# Patient Record
Sex: Female | Born: 1960 | Race: White | Hispanic: No | Marital: Married | Smoking: Former smoker
Health system: Southern US, Community
[De-identification: ages and names within clinical notes are randomized; demographics above are authoritative.]

## PROBLEM LIST (undated history)

## (undated) DIAGNOSIS — E039 Hypothyroidism, unspecified: Secondary | ICD-10-CM

## (undated) DIAGNOSIS — E079 Disorder of thyroid, unspecified: Secondary | ICD-10-CM

## (undated) DIAGNOSIS — Z8489 Family history of other specified conditions: Secondary | ICD-10-CM

## (undated) DIAGNOSIS — K759 Inflammatory liver disease, unspecified: Secondary | ICD-10-CM

## (undated) DIAGNOSIS — E785 Hyperlipidemia, unspecified: Secondary | ICD-10-CM

## (undated) DIAGNOSIS — E119 Type 2 diabetes mellitus without complications: Secondary | ICD-10-CM

## (undated) DIAGNOSIS — I10 Essential (primary) hypertension: Secondary | ICD-10-CM

## (undated) DIAGNOSIS — C801 Malignant (primary) neoplasm, unspecified: Secondary | ICD-10-CM

## (undated) HISTORY — PX: MELANOMA EXCISION: SHX5266

## (undated) HISTORY — DX: Essential (primary) hypertension: I10

## (undated) HISTORY — PX: LAPAROSCOPIC GASTRIC BANDING: SHX1100

## (undated) HISTORY — DX: Malignant (primary) neoplasm, unspecified: C80.1

## (undated) HISTORY — PX: CHOLECYSTECTOMY: SHX55

## (undated) HISTORY — PX: TONSILLECTOMY: SUR1361

## (undated) HISTORY — DX: Disorder of thyroid, unspecified: E07.9

---
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
## 1998-12-27 ENCOUNTER — Other Ambulatory Visit: Admission: RE | Admit: 1998-12-27 | Discharge: 1998-12-27 | Payer: Self-pay | Admitting: Obstetrics and Gynecology

## 2001-03-24 ENCOUNTER — Encounter: Admission: RE | Admit: 2001-03-24 | Discharge: 2001-03-24 | Payer: Self-pay | Admitting: Obstetrics and Gynecology

## 2001-03-24 ENCOUNTER — Encounter: Payer: Self-pay | Admitting: Obstetrics and Gynecology

## 2001-03-27 ENCOUNTER — Other Ambulatory Visit: Admission: RE | Admit: 2001-03-27 | Discharge: 2001-03-27 | Payer: Self-pay | Admitting: Obstetrics and Gynecology

## 2003-11-07 ENCOUNTER — Encounter: Admission: RE | Admit: 2003-11-07 | Discharge: 2003-11-07 | Payer: Self-pay | Admitting: Obstetrics and Gynecology

## 2003-11-15 ENCOUNTER — Other Ambulatory Visit: Admission: RE | Admit: 2003-11-15 | Discharge: 2003-11-15 | Payer: Self-pay | Admitting: Obstetrics and Gynecology

## 2004-11-15 ENCOUNTER — Other Ambulatory Visit: Admission: RE | Admit: 2004-11-15 | Discharge: 2004-11-15 | Payer: Self-pay | Admitting: Obstetrics and Gynecology

## 2006-08-19 ENCOUNTER — Encounter: Payer: Self-pay | Admitting: Family Medicine

## 2006-11-03 ENCOUNTER — Ambulatory Visit: Payer: Self-pay | Admitting: Family Medicine

## 2006-11-03 DIAGNOSIS — E039 Hypothyroidism, unspecified: Secondary | ICD-10-CM

## 2006-11-03 DIAGNOSIS — M545 Low back pain: Secondary | ICD-10-CM

## 2006-11-03 DIAGNOSIS — E785 Hyperlipidemia, unspecified: Secondary | ICD-10-CM

## 2006-11-03 DIAGNOSIS — M7730 Calcaneal spur, unspecified foot: Secondary | ICD-10-CM

## 2006-11-03 DIAGNOSIS — G43009 Migraine without aura, not intractable, without status migrainosus: Secondary | ICD-10-CM | POA: Insufficient documentation

## 2006-11-06 ENCOUNTER — Encounter: Admission: RE | Admit: 2006-11-06 | Discharge: 2006-11-06 | Payer: Self-pay | Admitting: Obstetrics and Gynecology

## 2006-11-14 ENCOUNTER — Encounter: Payer: Self-pay | Admitting: Family Medicine

## 2006-11-15 ENCOUNTER — Encounter: Payer: Self-pay | Admitting: Family Medicine

## 2006-11-18 ENCOUNTER — Encounter: Payer: Self-pay | Admitting: Family Medicine

## 2006-11-18 ENCOUNTER — Other Ambulatory Visit: Admission: RE | Admit: 2006-11-18 | Discharge: 2006-11-18 | Payer: Self-pay | Admitting: Obstetrics and Gynecology

## 2006-11-18 LAB — CONVERTED CEMR LAB
ALT: 29 units/L
AST: 23 units/L
Alkaline Phosphatase: 53 units/L
GGT: 42 units/L
Glucose, Bld: 74 mg/dL
Total Protein: 7.6 g/dL
Triglycerides: 153 mg/dL

## 2006-12-03 ENCOUNTER — Encounter: Payer: Self-pay | Admitting: Family Medicine

## 2006-12-03 LAB — CONVERTED CEMR LAB: Cholesterol, target level: 200 mg/dL

## 2006-12-04 ENCOUNTER — Encounter: Payer: Self-pay | Admitting: Family Medicine

## 2006-12-09 ENCOUNTER — Telehealth (INDEPENDENT_AMBULATORY_CARE_PROVIDER_SITE_OTHER): Payer: Self-pay | Admitting: *Deleted

## 2006-12-23 ENCOUNTER — Encounter: Payer: Self-pay | Admitting: Family Medicine

## 2007-01-16 ENCOUNTER — Telehealth (INDEPENDENT_AMBULATORY_CARE_PROVIDER_SITE_OTHER): Payer: Self-pay | Admitting: *Deleted

## 2007-02-05 ENCOUNTER — Telehealth: Payer: Self-pay | Admitting: Family Medicine

## 2007-03-04 ENCOUNTER — Encounter: Payer: Self-pay | Admitting: Family Medicine

## 2007-03-04 LAB — CONVERTED CEMR LAB
Albumin: 4.7 g/dL (ref 3.5–5.2)
BUN: 17 mg/dL (ref 6–23)
CO2: 25 meq/L (ref 19–32)
Calcium: 9.5 mg/dL (ref 8.4–10.5)
Chloride: 99 meq/L (ref 96–112)
Cholesterol: 152 mg/dL (ref 0–200)
Glucose, Bld: 128 mg/dL — ABNORMAL HIGH (ref 70–99)
HDL: 38 mg/dL — ABNORMAL LOW (ref >39)
Potassium: 3.4 meq/L — ABNORMAL LOW (ref 3.5–5.3)
Sodium: 139 meq/L (ref 135–145)
Total Protein: 7.5 g/dL (ref 6.0–8.3)
Triglycerides: 130 mg/dL (ref <150)

## 2007-03-06 ENCOUNTER — Ambulatory Visit: Payer: Self-pay | Admitting: Family Medicine

## 2007-03-06 LAB — CONVERTED CEMR LAB: Glucose, Bld: 159 mg/dL

## 2007-07-03 ENCOUNTER — Ambulatory Visit: Payer: Self-pay | Admitting: Family Medicine

## 2007-07-03 DIAGNOSIS — M5126 Other intervertebral disc displacement, lumbar region: Secondary | ICD-10-CM

## 2007-07-03 LAB — CONVERTED CEMR LAB
Blood Glucose, Fasting: 127 mg/dL
Hgb A1c MFr Bld: 5.9 %

## 2007-07-29 ENCOUNTER — Telehealth: Payer: Self-pay | Admitting: Family Medicine

## 2007-10-09 ENCOUNTER — Ambulatory Visit: Payer: Self-pay | Admitting: Family Medicine

## 2007-10-09 DIAGNOSIS — E669 Obesity, unspecified: Secondary | ICD-10-CM

## 2007-10-09 LAB — CONVERTED CEMR LAB: Hgb A1c MFr Bld: 5.7 %

## 2007-10-10 LAB — CONVERTED CEMR LAB
Creatinine, Ser: 0.97 mg/dL (ref 0.40–1.20)
HDL: 42 mg/dL (ref >39)
Total CHOL/HDL Ratio: 4.3
VLDL: 27 mg/dL (ref 0–40)

## 2007-11-09 ENCOUNTER — Ambulatory Visit: Payer: Self-pay | Admitting: Family Medicine

## 2007-11-10 ENCOUNTER — Encounter: Payer: Self-pay | Admitting: Family Medicine

## 2007-11-10 LAB — CONVERTED CEMR LAB
ALT: 34 units/L (ref 0–35)
Albumin: 4.2 g/dL (ref 3.5–5.2)
Alkaline Phosphatase: 49 units/L (ref 39–117)
Basophils Absolute: 0 10*3/uL (ref 0.0–0.1)
Eosinophils Absolute: 0.1 10*3/uL (ref 0.0–0.7)
Eosinophils Relative: 2 % (ref 0–5)
HCT: 41.8 % (ref 36.0–46.0)
Lymphocytes Relative: 22 % (ref 12–46)
Lymphs Abs: 1.8 10*3/uL (ref 0.7–4.0)
MCV: 90.5 fL (ref 78.0–100.0)
Neutrophils Relative %: 61 % (ref 43–77)
Platelets: 256 10*3/uL (ref 150–400)
Potassium: 4 meq/L (ref 3.5–5.3)
RDW: 12.9 % (ref 11.5–15.5)
Sodium: 142 meq/L (ref 135–145)
Total Bilirubin: 0.7 mg/dL (ref 0.3–1.2)
Total Protein: 6.9 g/dL (ref 6.0–8.3)
WBC: 8 10*3/uL (ref 4.0–10.5)

## 2007-12-10 ENCOUNTER — Encounter: Payer: Self-pay | Admitting: Family Medicine

## 2007-12-14 ENCOUNTER — Telehealth: Payer: Self-pay | Admitting: Family Medicine

## 2007-12-16 ENCOUNTER — Encounter: Payer: Self-pay | Admitting: Family Medicine

## 2008-01-04 ENCOUNTER — Encounter: Payer: Self-pay | Admitting: Family Medicine

## 2008-01-05 ENCOUNTER — Ambulatory Visit (HOSPITAL_COMMUNITY): Admission: RE | Admit: 2008-01-05 | Discharge: 2008-01-05 | Payer: Self-pay | Admitting: General Surgery

## 2008-01-05 ENCOUNTER — Encounter (INDEPENDENT_AMBULATORY_CARE_PROVIDER_SITE_OTHER): Payer: Self-pay | Admitting: General Surgery

## 2008-02-03 ENCOUNTER — Encounter: Payer: Self-pay | Admitting: Family Medicine

## 2008-03-28 ENCOUNTER — Ambulatory Visit: Payer: Self-pay | Admitting: Obstetrics and Gynecology

## 2008-03-28 ENCOUNTER — Other Ambulatory Visit: Admission: RE | Admit: 2008-03-28 | Discharge: 2008-03-28 | Payer: Self-pay | Admitting: Obstetrics and Gynecology

## 2008-03-28 ENCOUNTER — Encounter: Payer: Self-pay | Admitting: Obstetrics and Gynecology

## 2008-04-07 ENCOUNTER — Telehealth (INDEPENDENT_AMBULATORY_CARE_PROVIDER_SITE_OTHER): Payer: Self-pay | Admitting: *Deleted

## 2008-04-11 ENCOUNTER — Ambulatory Visit: Payer: Self-pay | Admitting: Obstetrics and Gynecology

## 2008-04-12 ENCOUNTER — Encounter: Payer: Self-pay | Admitting: Family Medicine

## 2008-05-09 ENCOUNTER — Encounter: Payer: Self-pay | Admitting: Family Medicine

## 2008-05-16 ENCOUNTER — Ambulatory Visit: Payer: Self-pay | Admitting: Family Medicine

## 2008-06-13 ENCOUNTER — Encounter: Payer: Self-pay | Admitting: Family Medicine

## 2008-06-20 ENCOUNTER — Encounter: Payer: Self-pay | Admitting: Family Medicine

## 2008-06-29 DIAGNOSIS — Z9884 Bariatric surgery status: Secondary | ICD-10-CM

## 2008-07-05 ENCOUNTER — Ambulatory Visit: Payer: Self-pay | Admitting: Family Medicine

## 2008-07-05 DIAGNOSIS — I10 Essential (primary) hypertension: Secondary | ICD-10-CM

## 2008-07-25 ENCOUNTER — Encounter: Admission: RE | Admit: 2008-07-25 | Discharge: 2008-07-25 | Payer: Self-pay | Admitting: Obstetrics and Gynecology

## 2008-07-26 ENCOUNTER — Encounter: Payer: Self-pay | Admitting: Family Medicine

## 2008-07-26 LAB — CONVERTED CEMR LAB
BUN: 9 mg/dL (ref 6–23)
CO2: 20 meq/L (ref 19–32)
Calcium: 9.3 mg/dL (ref 8.4–10.5)
Chloride: 106 meq/L (ref 96–112)
Creatinine, Ser: 0.73 mg/dL (ref 0.40–1.20)
Glucose, Bld: 88 mg/dL (ref 70–99)

## 2008-08-08 ENCOUNTER — Ambulatory Visit: Payer: Self-pay | Admitting: Family Medicine

## 2008-08-08 ENCOUNTER — Telehealth (INDEPENDENT_AMBULATORY_CARE_PROVIDER_SITE_OTHER): Payer: Self-pay | Admitting: *Deleted

## 2008-08-08 LAB — CONVERTED CEMR LAB
Blood in Urine, dipstick: NEGATIVE
Ketones, urine, test strip: NEGATIVE
Protein, U semiquant: NEGATIVE
Specific Gravity, Urine: 1.02
Urobilinogen, UA: 4
pH: 5.5

## 2008-08-09 ENCOUNTER — Encounter: Payer: Self-pay | Admitting: Family Medicine

## 2008-08-09 DIAGNOSIS — Z8619 Personal history of other infectious and parasitic diseases: Secondary | ICD-10-CM

## 2008-08-10 LAB — CONVERTED CEMR LAB
AST: 1081 units/L — ABNORMAL HIGH (ref 0–37)
BUN: 12 mg/dL (ref 6–23)
Basophils Relative: 1 % (ref 0–1)
CO2: 21 meq/L (ref 19–32)
Calcium: 9.5 mg/dL (ref 8.4–10.5)
Chloride: 101 meq/L (ref 96–112)
Creatinine, Ser: 0.7 mg/dL (ref 0.40–1.20)
Eosinophils Absolute: 0.2 10*3/uL (ref 0.0–0.7)
Eosinophils Relative: 3 % (ref 0–5)
GGT: 429 units/L — ABNORMAL HIGH (ref 7–51)
HCT: 47.6 % — ABNORMAL HIGH (ref 36.0–46.0)
Lymphs Abs: 1.5 10*3/uL (ref 0.7–4.0)
MCHC: 32.8 g/dL (ref 30.0–36.0)
MCV: 94.3 fL (ref 78.0–100.0)
Platelets: 186 10*3/uL (ref 150–400)
RBC Folate: 1182 ng/mL — ABNORMAL HIGH (ref 180–600)
Total Bilirubin: 5.3 mg/dL — ABNORMAL HIGH (ref 0.3–1.2)
WBC: 6.7 10*3/uL (ref 4.0–10.5)

## 2008-08-11 ENCOUNTER — Encounter: Admission: RE | Admit: 2008-08-11 | Discharge: 2008-08-11 | Payer: Self-pay | Admitting: Family Medicine

## 2008-08-11 ENCOUNTER — Telehealth: Payer: Self-pay | Admitting: Family Medicine

## 2008-08-11 ENCOUNTER — Encounter: Payer: Self-pay | Admitting: Family Medicine

## 2008-08-11 LAB — CONVERTED CEMR LAB: Hepatitis B Surface Ag: NEGATIVE

## 2008-08-12 ENCOUNTER — Encounter: Payer: Self-pay | Admitting: Family Medicine

## 2008-08-14 LAB — CONVERTED CEMR LAB
ALT: 820 units/L — ABNORMAL HIGH (ref 0–35)
Basophils Absolute: 0 10*3/uL (ref 0.0–0.1)
Basophils Relative: 0 % (ref 0–1)
Eosinophils Absolute: 0.2 10*3/uL (ref 0.0–0.7)
Eosinophils Relative: 4 % (ref 0–5)
HCT: 47.2 % — ABNORMAL HIGH (ref 36.0–46.0)
Indirect Bilirubin: 1.4 mg/dL — ABNORMAL HIGH (ref 0.0–0.9)
MCV: 96.3 fL (ref 78.0–100.0)
Platelets: 168 10*3/uL (ref 150–400)
RDW: 14.9 % (ref 11.5–15.5)
Total Protein: 6.6 g/dL (ref 6.0–8.3)

## 2008-08-15 ENCOUNTER — Encounter: Payer: Self-pay | Admitting: Family Medicine

## 2008-08-15 ENCOUNTER — Telehealth: Payer: Self-pay | Admitting: Family Medicine

## 2008-08-16 LAB — CONVERTED CEMR LAB
ALT: 679 units/L — ABNORMAL HIGH (ref 0–35)
Bilirubin, Direct: 1.6 mg/dL — ABNORMAL HIGH (ref 0.0–0.3)
Total Bilirubin: 3 mg/dL — ABNORMAL HIGH (ref 0.3–1.2)

## 2008-08-19 ENCOUNTER — Telehealth: Payer: Self-pay | Admitting: Family Medicine

## 2008-08-19 LAB — CONVERTED CEMR LAB
IgG (Immunoglobin G), Serum: 1230 mg/dL (ref 694–1618)
Platelets: 176 10*3/uL (ref 150–400)
Prothrombin Time: 12.3 s (ref 11.6–15.2)

## 2008-08-22 ENCOUNTER — Encounter: Payer: Self-pay | Admitting: Family Medicine

## 2008-08-22 LAB — CONVERTED CEMR LAB
ALT: 771 units/L — ABNORMAL HIGH (ref 0–35)
AST: 634 units/L — ABNORMAL HIGH (ref 0–37)
Albumin: 4.2 g/dL (ref 3.5–5.2)
HCV Quantitative: 2380000 intl units/mL — ABNORMAL HIGH (ref <43)
Total Bilirubin: 3.6 mg/dL — ABNORMAL HIGH (ref 0.3–1.2)
Total Protein: 7.3 g/dL (ref 6.0–8.3)

## 2008-08-30 ENCOUNTER — Encounter: Payer: Self-pay | Admitting: Family Medicine

## 2008-08-30 LAB — CONVERTED CEMR LAB
ALT: 513 units/L — ABNORMAL HIGH (ref 0–35)
AST: 319 units/L — ABNORMAL HIGH (ref 0–37)
Bilirubin, Direct: 0.8 mg/dL — ABNORMAL HIGH (ref 0.0–0.3)

## 2008-09-05 ENCOUNTER — Encounter: Payer: Self-pay | Admitting: Family Medicine

## 2008-09-06 LAB — CONVERTED CEMR LAB
ALT: 311 units/L — ABNORMAL HIGH (ref 0–35)
Albumin: 4 g/dL (ref 3.5–5.2)
Bilirubin, Direct: 0.6 mg/dL — ABNORMAL HIGH (ref 0.0–0.3)
Total Protein: 7 g/dL (ref 6.0–8.3)

## 2008-09-12 ENCOUNTER — Encounter: Payer: Self-pay | Admitting: Family Medicine

## 2008-09-13 LAB — CONVERTED CEMR LAB
Bilirubin, Direct: 0.5 mg/dL — ABNORMAL HIGH (ref 0.0–0.3)
Total Bilirubin: 1.4 mg/dL — ABNORMAL HIGH (ref 0.3–1.2)

## 2008-09-19 ENCOUNTER — Encounter: Payer: Self-pay | Admitting: Family Medicine

## 2008-09-20 LAB — CONVERTED CEMR LAB
Albumin: 4.3 g/dL (ref 3.5–5.2)
Alkaline Phosphatase: 107 units/L (ref 39–117)
Indirect Bilirubin: 0.9 mg/dL (ref 0.0–0.9)
Total Bilirubin: 1.1 mg/dL (ref 0.3–1.2)
Total Protein: 7.4 g/dL (ref 6.0–8.3)

## 2008-10-04 ENCOUNTER — Telehealth: Payer: Self-pay | Admitting: Family Medicine

## 2008-10-04 ENCOUNTER — Encounter: Payer: Self-pay | Admitting: Family Medicine

## 2008-10-05 ENCOUNTER — Encounter: Payer: Self-pay | Admitting: Family Medicine

## 2008-10-09 LAB — CONVERTED CEMR LAB
ALT: 631 units/L — ABNORMAL HIGH (ref 0–35)
Alkaline Phosphatase: 100 units/L (ref 39–117)
Bilirubin, Direct: 0.3 mg/dL (ref 0.0–0.3)
Indirect Bilirubin: 0.7 mg/dL (ref 0.0–0.9)

## 2008-10-11 LAB — CONVERTED CEMR LAB: AFP-Tumor Marker: 5.7 ng/mL (ref 0.0–8.0)

## 2008-11-04 ENCOUNTER — Ambulatory Visit: Payer: Self-pay | Admitting: Family Medicine

## 2008-12-05 ENCOUNTER — Encounter: Payer: Self-pay | Admitting: Family Medicine

## 2008-12-06 LAB — CONVERTED CEMR LAB
ALT: 313 units/L — ABNORMAL HIGH (ref 0–35)
AST: 176 units/L — ABNORMAL HIGH (ref 0–37)
Albumin: 4.2 g/dL (ref 3.5–5.2)
CO2: 23 meq/L (ref 19–32)
Calcium: 9.2 mg/dL (ref 8.4–10.5)
Chloride: 103 meq/L (ref 96–112)
Iron: 118 ug/dL (ref 42–145)
Platelets: 190 10*3/uL (ref 150–400)
Potassium: 3.8 meq/L (ref 3.5–5.3)
RDW: 13.3 % (ref 11.5–15.5)
Saturation Ratios: 32 % (ref 20–55)
Sodium: 139 meq/L (ref 135–145)
TIBC: 372 ug/dL (ref 250–470)
Total Protein: 7.2 g/dL (ref 6.0–8.3)
WBC: 7.8 10*3/uL (ref 4.0–10.5)

## 2008-12-14 ENCOUNTER — Telehealth: Payer: Self-pay | Admitting: Family Medicine

## 2009-03-27 ENCOUNTER — Ambulatory Visit: Payer: Self-pay | Admitting: Family Medicine

## 2009-03-27 LAB — CONVERTED CEMR LAB
Blood in Urine, dipstick: NEGATIVE
Glucose, Urine, Semiquant: NEGATIVE
Protein, U semiquant: NEGATIVE
Specific Gravity, Urine: 1.015
Urobilinogen, UA: 0.2
pH: 5.5

## 2009-05-18 ENCOUNTER — Telehealth: Payer: Self-pay | Admitting: Family Medicine

## 2009-05-23 ENCOUNTER — Ambulatory Visit: Payer: Self-pay | Admitting: Family Medicine

## 2009-05-23 LAB — CONVERTED CEMR LAB
Albumin: 4.2 g/dL (ref 3.5–5.2)
Alkaline Phosphatase: 78 units/L (ref 39–117)
BUN: 12 mg/dL (ref 6–23)
Creatinine, Ser: 0.87 mg/dL (ref 0.40–1.20)
Glucose, Bld: 152 mg/dL — ABNORMAL HIGH (ref 70–99)
HDL: 39 mg/dL — ABNORMAL LOW (ref >39)
LDL Cholesterol: 127 mg/dL — ABNORMAL HIGH (ref 0–99)
Potassium: 3.9 meq/L (ref 3.5–5.3)
Triglycerides: 186 mg/dL — ABNORMAL HIGH (ref <150)

## 2009-07-04 ENCOUNTER — Encounter: Payer: Self-pay | Admitting: Family Medicine

## 2009-07-06 LAB — CONVERTED CEMR LAB
HDL: 35 mg/dL — ABNORMAL LOW (ref >39)
LDL Cholesterol: 61 mg/dL (ref 0–99)
Triglycerides: 242 mg/dL — ABNORMAL HIGH (ref <150)
VLDL: 48 mg/dL — ABNORMAL HIGH (ref 0–40)

## 2009-07-07 ENCOUNTER — Encounter: Payer: Self-pay | Admitting: Family Medicine

## 2009-08-21 ENCOUNTER — Encounter: Admission: RE | Admit: 2009-08-21 | Discharge: 2009-08-21 | Payer: Self-pay | Admitting: Obstetrics and Gynecology

## 2009-10-17 ENCOUNTER — Telehealth: Payer: Self-pay | Admitting: Family Medicine

## 2009-10-18 ENCOUNTER — Encounter: Payer: Self-pay | Admitting: Family Medicine

## 2009-10-19 LAB — CONVERTED CEMR LAB
AST: 67 units/L — ABNORMAL HIGH (ref 0–37)
Alkaline Phosphatase: 81 units/L (ref 39–117)
BUN: 8 mg/dL (ref 6–23)
Creatinine, Ser: 0.78 mg/dL (ref 0.40–1.20)
HDL: 35 mg/dL — ABNORMAL LOW (ref >39)
LDL Cholesterol: 110 mg/dL — ABNORMAL HIGH (ref 0–99)
Potassium: 4.1 meq/L (ref 3.5–5.3)
TSH: 4.279 microintl units/mL (ref 0.350–4.500)
Total CHOL/HDL Ratio: 5.4

## 2009-10-24 ENCOUNTER — Ambulatory Visit: Payer: Self-pay | Admitting: Family Medicine

## 2009-10-26 ENCOUNTER — Other Ambulatory Visit: Admission: RE | Admit: 2009-10-26 | Discharge: 2009-10-26 | Payer: Self-pay | Admitting: Obstetrics and Gynecology

## 2009-10-26 ENCOUNTER — Ambulatory Visit: Payer: Self-pay | Admitting: Obstetrics and Gynecology

## 2010-02-27 NOTE — Assessment & Plan Note (Signed)
Summary: Urinary frequency  Nurse Visit   Primary Care Provider:  Linford Arnold, C   History of Present Illness: Urinary frequency started this AM. No fever or back pain.    Allergies: 1)  ! Asa 2)  ! * Actos Laboratory Results   Urine Tests  Date/Time Received: 03/27/2009 Date/Time Reported: 03/27/2009  Routine Urinalysis   Color: yellow Appearance: Clear Glucose: negative   (Normal Range: Negative) Bilirubin: negative   (Normal Range: Negative) Ketone: negative   (Normal Range: Negative) Spec. Gravity: 1.015   (Normal Range: 1.003-1.035) Blood: negative   (Normal Range: Negative) pH: 5.5   (Normal Range: 5.0-8.0) Protein: negative   (Normal Range: Negative) Urobilinogen: 0.2   (Normal Range: 0-1) Nitrite: negative   (Normal Range: Negative) Leukocyte Esterace: negative   (Normal Range: Negative)       Orders Added: 1)  UA Dipstick w/o Micro (automated)  [81003] 2)  Est. Patient Level I [16109] UA is neg so no need for tX.If not better in 3-4 days then followup.February 28, 20113:35 PM.name

## 2010-02-27 NOTE — Assessment & Plan Note (Signed)
Summary: 5 MONTH FU HTN, lipids   Vital Signs:  Patient profile:   50 year old female Height:      63 inches Weight:      211 pounds Pulse rate:   76 / minute BP sitting:   170 / 86  (right arm) Cuff size:   large  Vitals Entered By: Avon Gully CMA, Duncan Dull) (October 24, 2009 8:10 AM)  Contraindications/Deferment of Procedures/Staging:    Test/Procedure: FLU VAX    Reason for deferment: patient declined  CC: f/u chol and BP   Primary Care Provider:  Linford Arnold, C  CC:  f/u chol and BP.  History of Present Illness: Yesterday had a severe HA adn BP was 180. Then rechecked it and was 190.  Took a BP dose.  Has been taking them every other day.  Yesterday did have popcorn and that the doesn't usually eat. Has gained some weight.  No exercise in the last month.    Current Medications (verified): 1)  Levothroid 150 Mcg Tabs (Levothyroxine Sodium) .Marland Kitchen.. 1 Tab By Mouth Daily On Sat, Sun, Tues, Thurs 2)  Losartan Potassium-Hctz 100-12.5 Mg Tabs (Losartan Potassium-Hctz) .... Take 1 Tablet By Mouth Once A Day 3)  Glucommeter .... One Machine.  Check Cbgs Twice A Week. 4)  Multivitamins  Tabs (Multiple Vitamin) .... Take 1 Tablet By Mouth Once A Day 5)  Levothroid 175 Mcg Tabs (Levothyroxine Sodium) .... One By Mouth On Mon, Wed, Friday 6)  Fish Oil 1000 Mg Caps (Omega-3 Fatty Acids) .... 2 By Mouth Daily  Allergies (verified): 1)  ! Asa 2)  ! * Actos  Comments:  Nurse/Medical Assistant: The patient's medications and allergies were reviewed with the patient and were updated in the Medication and Allergy Lists. Avon Gully CMA, Duncan Dull) (October 24, 2009 8:11 AM)  Physical Exam  General:  Well-developed,well-nourished,in no acute distress; alert,appropriate and cooperative throughout examination Head:  Normocephalic and atraumatic without obvious abnormalities. No apparent alopecia or balding. Lungs:  Normal respiratory effort, chest expands symmetrically. Lungs are  clear to auscultation, no crackles or wheezes. Heart:  Normal rate and regular rhythm. S1 and S2 normal without gallop, murmur, click, rub or other extra sounds. Extremities:  No LE edema.  Skin:  no rashes.   Cervical Nodes:  No lymphadenopathy noted Psych:  Cognition and judgment appear intact. Alert and cooperative with normal attention span and concentration. No apparent delusions, illusions, hallucinations   Impression & Recommendations:  Problem # 1:  HYPERTENSION, BENIGN (ICD-401.1) Restart daily. F/U in 1 month for BP recheck.   Her updated medication list for this problem includes:    Losartan Potassium-hctz 100-12.5 Mg Tabs (Losartan potassium-hctz) .Marland Kitchen... Take 1 tablet by mouth once a day  Problem # 2:  HYPERLIPIDEMIA NEC/NOS (ICD-272.4) Will stop statin since her LDL is 110. Her goal is < 130. Discussed fish oil, flax seed and exercise to controle her TG and recheck in 3 months.   Problem # 3:  HEPATITIS C, ACUTE (ICD-070.51) Plans on starting tx at the end of the year.   Complete Medication List: 1)  Levothroid 150 Mcg Tabs (Levothyroxine sodium) .Marland Kitchen.. 1 tab by mouth daily on sat, sun, tues, thurs 2)  Losartan Potassium-hctz 100-12.5 Mg Tabs (Losartan potassium-hctz) .... Take 1 tablet by mouth once a day 3)  Glucommeter  .... One machine.  check cbgs twice a week. 4)  Multivitamins Tabs (Multiple vitamin) .... Take 1 tablet by mouth once a day 5)  Levothroid 175 Mcg Tabs (  Levothyroxine sodium) .... One by mouth on mon, wed, friday 6)  Fish Oil 1000 Mg Caps (Omega-3 fatty acids) .... 2 by mouth daily  Patient Instructions: 1)  Please schedule a follow-up appointment in 1 month for blood pressure check.  Prescriptions: LOSARTAN POTASSIUM-HCTZ 100-12.5 MG TABS (LOSARTAN POTASSIUM-HCTZ) Take 1 tablet by mouth once a day  #90 x 1   Entered and Authorized by:   Nani Gasser MD   Signed by:   Nani Gasser MD on 10/24/2009   Method used:   Electronically to         [facility] (mail-order)             , [place]         Ph: [phone]       Fax: [phone]   RxID:   [id number]

## 2010-02-27 NOTE — Progress Notes (Signed)
Summary: requests labs prior to appt  Phone Note Call from Patient   Caller: Patient Call For: Nani Gasser MD Summary of Call: Pt called and stated she would like to return to have her liver tests rechecked. Her follow up is next Tuesday. Pt. wants to have labs before she sees you that morning.  Are there any other tests you would like her to have?  Mervin Kung CMA  May 18, 2009 1:29 PM   Follow-up for Phone Call        Slip printed. Can fax downstairs.  Follow-up by: Nani Gasser MD,  May 18, 2009 3:43 PM  Additional Follow-up for Phone Call Additional follow up Details #1::        Order faxed to [phone].  Mervin Kung CMA  May 18, 2009 4:18 PM

## 2010-02-27 NOTE — Progress Notes (Signed)
Summary: Labwork  Phone Note Call from Patient Call back at Work Phone [phone]   Caller: Patient Call For: Nani Gasser MD Summary of Call: Pt calls and states she is going downstairs in the morning for labwork and needs the order sent down. Please advise Initial call taken by: Kathlene November,  October 17, 2009 1:33 PM  Follow-up for Phone Call        Printed.  Follow-up by: Nani Gasser MD,  October 17, 2009 2:36 PM

## 2010-02-27 NOTE — Letter (Signed)
[facility]: [facility]   Imported By: Lanelle Bal 06/02/2009 12:57:33  _____________________________________________________________________  External Attachment:    Type:   Image     Comment:   External Document

## 2010-02-27 NOTE — Assessment & Plan Note (Signed)
Summary: 6 MO FU HTN, sugars, thyroid   Vital Signs:  Patient profile:   50 year old female Height:      63 inches Weight:      202 pounds BMI:     35.91 Pulse rate:   73 / minute BP sitting:   126 / 71  (left arm) Cuff size:   large  Vitals Entered By: Kathlene November (May 23, 2009 8:28 AM) CC: followup BP. Pt states blood sugars running in the 90's fasting   Primary Care Provider:  Linford Arnold, C  CC:  followup BP. Pt states blood sugars running in the 90's fasting.  History of Present Illness: followup BP. Pt states blood sugars running in the 90's fasting.   Current Medications (verified): 1)  Levothroid 112 Mcg Tabs (Levothyroxine Sodium) .... Take 1 Tablet By Mouth Once A Day 2)  Hyzaar 100-25 Mg  Tabs (Losartan Potassium-Hctz) .... One-Half  Tab By Mouth 3)  Darvocet-N 100 100-650 Mg  Tabs (Propoxyphene N-Apap) .Marland Kitchen.. 1 Tab By Mouth Every 6 Hours As Needed Migriane 4)  Glucommeter .... One Machine.  Check Cbgs Twice A Week. 5)  Multivitamins  Tabs (Multiple Vitamin) .... Take 1 Tablet By Mouth Once A Day  Allergies (verified): 1)  ! Asa 2)  ! * Actos  Comments:  Nurse/Medical Assistant: The patient's medications and allergies were reviewed with the patient and were updated in the Medication and Allergy Lists. Kathlene November (May 23, 2009 8:29 AM)  Physical Exam  General:  Well-developed,well-nourished,in no acute distress; alert,appropriate and cooperative throughout examination Head:  Normocephalic and atraumatic without obvious abnormalities. No apparent alopecia or balding. Lungs:  Normal respiratory effort, chest expands symmetrically. Lungs are clear to auscultation, no crackles or wheezes. Heart:  Normal rate and regular rhythm. S1 and S2 normal without gallop, murmur, click, rub or other extra sounds. No carotid bruits.  Skin:  no rashes.   Psych:  Cognition and judgment appear intact. Alert and cooperative with normal attention span and concentration. No  apparent delusions, illusions, hallucinations   Impression & Recommendations:  Problem # 1:  HYPERTENSION, BENIGN (ICD-401.1) Decrease dose of the HCTZ a little. F/U in 4-6 months.  Her updated medication list for this problem includes:    Losartan Potassium-hctz 100-12.5 Mg Tabs (Losartan potassium-hctz) .Marland Kitchen... Take 1 tablet by mouth once a day  Problem # 2:  IMPAIRED FASTING GLUCOSE (ICD-790.21) Resolved. Will remove from her problem list.   Problem # 3:  HYPOTHYROIDISM NOS (ICD-244.9) Went for labs today. Will adjust med if needed.  She has gained about 6 lbs back post weight loss surgery adn it getting her band filled today.  Her updated medication list for this problem includes:    Levothroid 112 Mcg Tabs (Levothyroxine sodium) .Marland Kitchen... Take 1 tablet by mouth once a day  Labs Reviewed: TSH: 3.653 (12/05/2008)    HgBA1c: 6.0 (05/16/2008) Chol: 181 (10/09/2007)   HDL: 42 (10/09/2007)   LDL: 112 (10/09/2007)   TG: 136 (10/09/2007)  Problem # 4:  HEPATITIS C, ACUTE (ICD-070.51) Had labs drawn for liver enzymes.   Will fax labs over to Dr. Loman Chroman.   Orders: T-Ferritin ([id number])  Complete Medication List: 1)  Levothroid 112 Mcg Tabs (Levothyroxine sodium) .... Take 1 tablet by mouth once a day 2)  Losartan Potassium-hctz 100-12.5 Mg Tabs (Losartan potassium-hctz) .... Take 1 tablet by mouth once a day 3)  Darvocet-n 100 100-650 Mg Tabs (Propoxyphene n-apap) .Marland Kitchen.. 1 tab by mouth every 6 hours as needed  migriane 4)  Glucommeter  .... One machine.  check cbgs twice a week. 5)  Multivitamins Tabs (Multiple vitamin) .... Take 1 tablet by mouth once a day  Patient Instructions: 1)  Please schedule a follow-up appointment in 4-6  months for blod pressure.  2)  We will call you with your lab results.  Prescriptions: LOSARTAN POTASSIUM-HCTZ 100-12.5 MG TABS (LOSARTAN POTASSIUM-HCTZ) Take 1 tablet by mouth once a day  #90 x 1   Entered and Authorized by:   Nani Gasser MD    Signed by:   Nani Gasser MD on 05/23/2009   Method used:   Printed then faxed to ...       [facility])             , [place]         Ph: [phone]       Fax: [phone]   RxID:   [id number]

## 2010-05-07 ENCOUNTER — Other Ambulatory Visit: Payer: Self-pay | Admitting: Family Medicine

## 2010-05-07 MED ORDER — LEVOTHYROXINE SODIUM 150 MCG PO TABS: 150.0000 ug | ORAL_TABLET | Freq: Every day | ORAL | Status: DC

## 2010-05-07 MED ORDER — LOSARTAN POTASSIUM-HCTZ 100-12.5 MG PO TABS: 1.0000 | ORAL_TABLET | Freq: Every day | ORAL | Status: DC

## 2010-05-07 MED ORDER — LEVOTHYROXINE SODIUM 175 MCG PO TABS: 175.0000 ug | ORAL_TABLET | Freq: Every day | ORAL | Status: DC

## 2010-05-08 ENCOUNTER — Telehealth: Payer: Self-pay | Admitting: Family Medicine

## 2010-05-08 MED ORDER — LEVOTHYROXINE SODIUM 175 MCG PO TABS: 175.0000 ug | ORAL_TABLET | Freq: Every day | ORAL | Status: DC

## 2010-05-08 NOTE — Telephone Encounter (Signed)
Called and notified pt

## 2010-05-08 NOTE — Telephone Encounter (Signed)
Call pt: just got copy of labs from [facility]. TSH was 12. Lets increase thyroid dose to daily. Hold the tabs. Recheck in 8 weeks.

## 2010-05-09 ENCOUNTER — Other Ambulatory Visit: Payer: Self-pay | Admitting: Family Medicine

## 2010-05-09 ENCOUNTER — Telehealth: Payer: Self-pay | Admitting: *Deleted

## 2010-05-09 ENCOUNTER — Other Ambulatory Visit: Payer: Self-pay | Admitting: *Deleted

## 2010-05-09 MED ORDER — LOSARTAN POTASSIUM-HCTZ 100-12.5 MG PO TABS: 1.0000 | ORAL_TABLET | Freq: Every day | ORAL | Status: DC

## 2010-05-09 MED ORDER — EPINEPHRINE 0.3 MG/0.3ML IJ DEVI: 0.3000 mg | Freq: Once | INTRAMUSCULAR | Status: AC

## 2010-05-09 MED ORDER — LEVOTHYROXINE SODIUM 175 MCG PO TABS: 175.0000 ug | ORAL_TABLET | Freq: Every day | ORAL | Status: DC

## 2010-05-09 NOTE — Telephone Encounter (Signed)
sent 

## 2010-05-14 ENCOUNTER — Ambulatory Visit: Payer: Self-pay | Admitting: Family Medicine

## 2010-05-22 ENCOUNTER — Encounter: Payer: Self-pay | Admitting: Family Medicine

## 2010-06-07 ENCOUNTER — Encounter: Payer: Self-pay | Admitting: Family Medicine

## 2010-06-12 NOTE — Op Note (Signed)
Pamela Holloway, Pamela Holloway                  ACCOUNT NO.:  [id number]   MEDICAL RECORD NO.:  [id number]          PATIENT TYPE:  AMB   LOCATION:  SDS                          FACILITY:  [facility]   PHYSICIAN:  Gabrielle Dare. Janee Morn, M.D.DATE OF BIRTH:  29-Oct-1960   DATE OF PROCEDURE:  01/05/2008  DATE OF DISCHARGE:                               OPERATIVE REPORT   PREOPERATIVE DIAGNOSES:  Symptomatic cholelithiasis.   POSTOPERATIVE DIAGNOSES:  Acute cholecystitis.   PROCEDURE:  Laparoscopic cholecystectomy.   SURGEON:  Violeta Gelinas, MD.   ASSISTANT:  Consuello Bossier, M.D.   ANESTHESIA:  General endotracheal.   HISTORY OF PRESENT ILLNESS:  Pamela Holloway is a 50 year old female who I  evaluated in the office for symptomatic cholelithiasis.  She is  scheduled for elective cholecystectomy today.  She has been feeling well  for the past 2 weeks since I saw her in the office and has been  tolerating a regular diet.   PROCEDURE IN DETAIL:  She was identified in the preop holding area.  Informed consent was obtained.  She received intravenous antibiotics.  The patient was brought to the operating room and given general  endotracheal anesthesia by the anesthesia staff.  Her abdomen was  prepped and draped in a sterile fashion.  A time-out procedure was done.  An infraumbilical area was infiltrated with 0.25% Marcaine.  An  infraumbilical incision was made.  Subcutaneous tissues were dissected  down revealing the anterior fascia.  This was divided sharply.  The  peritoneal cavity was entered under direct vision without difficulty.  A  0 Vicryl pursestring suture was placed around the fascial opening and a  Hasson trocar was inserted into the abdomen. The abdomen was insufflated  with carbon dioxide in a standard fashion.  Under direct vision, a 11-mm  epigastric and two 5-mm lateral ports were placed.  A 0.25% Marcaine  with epinephrine was used at all port sites.  Diagnostic laparoscopy  revealed a  significant inflammation of the gallbladder with a large  amount of omentum stuck and adherent to the gallbladder.  The omentum  was gently and gradually dissected down from the gallbladder.  It was  distended and tense with a lot of edema.  It was drained with a  __________ drainage system facilitating a grasp of the dome.  The dome  was retracted superomedially.  We then continued to gradually sweeping  down the omentum being very careful to stay right along the wall of the  gallbladder.  This was eventually able to reveal the infundibulum, was  retracted inferolaterally.  The duodenum was also very stuck down there  but with very gentle and gradual dissections, swept away.  Once we were  able to retract the infundibulum and bring it down laterally, dissection  began laterally and then progressed medially identifying the cystic duct  and the cystic artery.  The dissection continued until excellent  visualization was seen between the infundibulum with gallbladder all the  way down to the junction of the cystic duct with common bile duct.  Once  we had excellent visualization here,  the cystic duct was determined to  be of too short a length to safely do a cholangiogram, so that was not  attempted.  Once dissection continued until this large window had easy  visualization, 3 clips were placed proximally on the cystic duct and one  was placed distally and it was divided.  Subsequently, the anterior  branch of the cystic artery was circumferentially dissected.  This was  clipped twice proximally and once distally and divided.  The gallbladder  was then taken off the liver bed gradually with Bovie cautery.  We did  come across a small posterior branch of the cystic artery, which was  clipped twice proximally and then cauterized distally.  Gallbladder was  removed the rest of the way from the liver bed.  Achieving excellent  hemostasis along the way with cautery, the gallbladder was placed in  an  EndoCatch bag and removed from the abdomen via the infraumbilical port  site.  The abdomen was copiously irrigated.  The liver bed was rechecked  and remained dry.  We did use nearly 3 liters of saline until irrigation  fluids were clear.  The liver bed was rechecked, remained dry.  Clips  remained in excellent position.  The remainder of the irrigation fluid  was evacuated and it was clear.  The ports were removed under direct  vision.  Pneumoperitoneum was released.  The infraumbilical fascia was  closed with 0-Vicryl pursestring suture with care not to trap any  intraabdominal contents.  All 4 wounds were copiously irrigated and the  skin of each was closed with a running 4-0 Vicryl subcuticular stitch  followed by Dermabond.  The sponge, needle, and instrument counts were  correct. The patient tolerated the procedure well without apparent  complications.  The patient was taken to the recovery room in stable  condition.      Gabrielle Dare Janee Morn, M.D.  Electronically Signed     BET/MEDQ  D:  01/05/2008  T:  01/06/2008  Job:  [id number]   cc:   Seymour Bars, D.O.

## 2010-07-19 ENCOUNTER — Other Ambulatory Visit: Payer: Self-pay | Admitting: Obstetrics and Gynecology

## 2010-07-19 DIAGNOSIS — Z1231 Encounter for screening mammogram for malignant neoplasm of breast: Secondary | ICD-10-CM

## 2010-08-20 ENCOUNTER — Telehealth: Payer: Self-pay | Admitting: Family Medicine

## 2010-08-20 MED ORDER — LOSARTAN POTASSIUM-HCTZ 100-12.5 MG PO TABS: 1.0000 | ORAL_TABLET | Freq: Every day | ORAL | Status: DC

## 2010-08-20 MED ORDER — LEVOTHYROXINE SODIUM 175 MCG PO TABS: 175.0000 ug | ORAL_TABLET | Freq: Every day | ORAL | Status: DC

## 2010-08-20 NOTE — Telephone Encounter (Signed)
For her medications to make it today. It typically takes in about a week to ship them out. Hopefully she will receive them sometime next week when she is back in town.

## 2010-08-21 NOTE — Telephone Encounter (Signed)
Left message on vm

## 2010-08-27 ENCOUNTER — Telehealth: Payer: Self-pay | Admitting: Family Medicine

## 2010-08-27 NOTE — Telephone Encounter (Signed)
I just received her labs from Dr. Marcello Fennel office today. Her thyroid was 0.06 which is actually low. That means that we need to reduce her thyroid medication. Since the prior center refill to her mail order what she can do is take a half of a tab 2 days a week, for example Monday and Thursday. Didn't take a tablet the other 5 days of the week. Then let's recheck her level in about 8 weeks. She can call here and we can order that down stairs or she can get that done at [facility] if she prefers.

## 2010-08-28 ENCOUNTER — Ambulatory Visit
Admission: RE | Admit: 2010-08-28 | Discharge: 2010-08-28 | Disposition: A | Discharge status: Home / Self Care | Payer: Self-pay | Source: Ambulatory Visit | Attending: Obstetrics and Gynecology | Admitting: Obstetrics and Gynecology

## 2010-08-28 DIAGNOSIS — Z1231 Encounter for screening mammogram for malignant neoplasm of breast: Secondary | ICD-10-CM

## 2010-08-28 NOTE — Telephone Encounter (Signed)
Pt notified and states she has her thyroid check once a month a Dr. Marcello Fennel office so he will check it for her after she has been on the adjusted dose

## 2010-08-28 NOTE — Telephone Encounter (Signed)
Let message on pt cell phone with results and for pt to call back to let us know what she wants to do about labs and with any questions

## 2010-08-30 ENCOUNTER — Telehealth: Payer: Self-pay | Admitting: Family Medicine

## 2010-08-30 NOTE — Telephone Encounter (Signed)
Pt aware.

## 2010-08-30 NOTE — Telephone Encounter (Signed)
Call pt: Please call patient. Normal mammogram.  Repeat in 1 year.   

## 2010-09-06 ENCOUNTER — Encounter: Payer: Self-pay | Admitting: Obstetrics and Gynecology

## 2010-09-06 ENCOUNTER — Ambulatory Visit (INDEPENDENT_AMBULATORY_CARE_PROVIDER_SITE_OTHER): Payer: BC Managed Care – PPO | Admitting: Obstetrics and Gynecology

## 2010-09-06 DIAGNOSIS — N911 Secondary amenorrhea: Secondary | ICD-10-CM

## 2010-09-06 DIAGNOSIS — R809 Proteinuria, unspecified: Secondary | ICD-10-CM

## 2010-09-06 DIAGNOSIS — N912 Amenorrhea, unspecified: Secondary | ICD-10-CM

## 2010-09-06 NOTE — Progress Notes (Signed)
Addended by: Cammie Mcgee T on: 09/06/2010 04:30 PM   Modules accepted: Orders

## 2010-09-06 NOTE — Progress Notes (Signed)
The patient came to see me because she has not had a period since April 17. She contraceptives by vasectomy. She is having hot flashes but she's had these for almost 10 years. There no more severe than ever been. She is being treated for hepatitis C. She also has been over placed for her hypothyroidism and they have currently stopped her thyroid medicine  Her abdomen is soft without guarding rebound or masses. External within normal limits. BUS within normal limits. Vaginal exam within normal limits. Cervix is clean. Uterus is normal size and shape. Adnexa failed to reveal masses.  Assessment: Secondary amenorrhea  Plan: Appropriate labs drawn. The patient's pregnancy test negative Provera 10 mg a day for 5 days. Patient will call me if she does not have a period With the Provera. We will inform the patient about her other lab results.

## 2010-09-06 NOTE — Patient Instructions (Signed)
Call for pregnancy tests results. If negative take provera for five days. If no period by August 29th call me.

## 2010-09-07 NOTE — Progress Notes (Signed)
PER DR. GOTTSEGENS REQUEST PT CALLED FOR QUAL HCG. RESULTS. I NOTIFIED PT TEST IS NEGATVE.

## 2010-09-17 ENCOUNTER — Ambulatory Visit (INDEPENDENT_AMBULATORY_CARE_PROVIDER_SITE_OTHER): Payer: BC Managed Care – PPO | Admitting: Family Medicine

## 2010-09-17 ENCOUNTER — Encounter: Payer: Self-pay | Admitting: Family Medicine

## 2010-09-17 DIAGNOSIS — I10 Essential (primary) hypertension: Secondary | ICD-10-CM

## 2010-09-17 DIAGNOSIS — R7309 Other abnormal glucose: Secondary | ICD-10-CM

## 2010-09-17 DIAGNOSIS — E119 Type 2 diabetes mellitus without complications: Secondary | ICD-10-CM

## 2010-09-17 LAB — POCT CBG (FASTING - GLUCOSE)-MANUAL ENTRY: Glucose Fasting, POC: 166 mg/dL

## 2010-09-17 NOTE — Assessment & Plan Note (Signed)
Not well controlled. Discussed will increase her metoprolol and f/u in 1 mo.

## 2010-09-17 NOTE — Progress Notes (Signed)
  Subjective:    Patient ID: Pamela Holloway, female    DOB: 03-23-1960, 50 y.o.   MRN: [id number]  HPI Here today is because blood sugars are elevated. She was fasting. The pegasis can cause an elevation in her sugars.  She admist hasn't been watching what she eats.  Has been eating some sweets.   HTN - normally her BP looks great but didn't take her meds this AM since was fasting.  She is very compliant with her medications. No chest pain or shortness of breath. She is tolerating her medications well. No lightheadedness or dizziness. She gets her blood pressure checked every week when she has her medication administration and she says they have been very well controlled.  She also recently followed up with her GYN for hot flashes. It appears that she is postmenopausal. She wonders if this could be some of her medications.   Review of Systems     Objective:   Physical Exam  Constitutional: She is oriented to person, place, and time. She appears well-developed and well-nourished.  Cardiovascular: Normal rate, regular rhythm and normal heart sounds.   Pulmonary/Chest: Effort normal and breath sounds normal.  Neurological: She is alert and oriented to person, place, and time.  Skin: Skin is warm and dry.  Psychiatric: She has a normal mood and affect. Her behavior is normal.          Assessment & Plan:  Abnormal glucose- Sugar is 166. A1C is 5.2 which is actually in the normal range. This is very strange. The me that indicates that she's had more recent bump in her blood sugar. This certainly could be do to the Pegasus that she is taking. I will try to touch with her gastroenterologist and at this week to see if he wants to continue the medication or if he wants me to treat her blood sugars. I did ask her to try find her home glucose machine and start checking her sugar daily in the mornings. If she cannot find it and she will come in we will get her a new prescription.  Hypertension-we will  keep an eye on this as she didn't take her medications because she came in fasting. She says normally when she has her followup appointment weekly for her medication her blood pressure looks fantastic. I did encourage her to get back on her healthy diet and she admits she has had some poor dietary choices recently.

## 2010-09-19 ENCOUNTER — Telehealth: Payer: Self-pay | Admitting: Family Medicine

## 2010-09-19 MED ORDER — METFORMIN HCL 500 MG PO TABS: 500.0000 mg | ORAL_TABLET | Freq: Two times a day (BID) | ORAL | Status: DC

## 2010-09-19 NOTE — Telephone Encounter (Signed)
Call patient: I called and spoke with her gastroenterologist. At this point in time he would like to continue her current treatment since she is doing so well and we will work on controlling her blood sugar. I would like to start her on on metformin which should not interact with her other medications. I will start a low dose. Please remind her that she could have some loose stools initially but this does usually improve after the first couple of weeks. I want her to start checking her blood sugar once a day fasting in the morning. Also want her to get back on track with cutting back on her sweets and carbohydrates in any sweet beverages. I would like to see her back in the office in about 3 weeks to make sure that she is doing well and that we don't need to adjust her medication.

## 2010-09-20 NOTE — Telephone Encounter (Signed)
Pt.notified

## 2010-10-08 ENCOUNTER — Ambulatory Visit (INDEPENDENT_AMBULATORY_CARE_PROVIDER_SITE_OTHER): Payer: BC Managed Care – PPO | Admitting: Obstetrics and Gynecology

## 2010-10-08 DIAGNOSIS — Z780 Asymptomatic menopausal state: Secondary | ICD-10-CM

## 2010-10-08 DIAGNOSIS — N951 Menopausal and female climacteric states: Secondary | ICD-10-CM

## 2010-10-08 NOTE — Progress Notes (Signed)
The patient came back to see me today to discuss her lab results. She has secondary amenorrhea. We tried withdrawal her with progestin without success. Her FSH came back at 21. Her prolactin was normal. She was having a lot of hot flashes. Her Synthroid had been temporarily stopped. She is now back on her Synthroid and her hot flashes are gone.we discussed that her FSH is in the borderline range. I'm not sure whether it will continue to go up or come back down. She is not symptomatic enough to be treated now. She will call for treatment if her symptoms get worse. We scheduled bone density. We discussed adequate calcium and D. I explained to her that she may start to initiate periods again. She will call me if that happens. Total time of interview was 30 minutes.

## 2010-10-22 ENCOUNTER — Telehealth: Payer: Self-pay

## 2010-10-22 NOTE — Telephone Encounter (Signed)
I think patient can wait on bone density.

## 2010-10-22 NOTE — Telephone Encounter (Signed)
PT. NOTIFIED OF DR. G'S NOTE BELOW AND WILL KEEP 10-2010 AEX WITH HIM.

## 2010-10-22 NOTE — Telephone Encounter (Signed)
PLEASE REFER TO YOUR OV NOTE OF 10-19-10. PT.DID GET HER PERIOD ON HER OWN 10-11-10. DOES SHE NEED TO STILL HAVE DEXA? OUR APPTS. HAD TO CANCEL IT FOR THE FIRST WEEK OF NEXT MONTH. DOES SHE STILL NEED TO FOLLOW UP DOING THE DEXA AND DOES SHE NEED TO FOLLOW UP WITH YOU ANYWAY AS LONG AS SHE GETS HER PERIODS?

## 2010-10-25 ENCOUNTER — Telehealth: Payer: Self-pay | Admitting: Family Medicine

## 2010-10-25 NOTE — Telephone Encounter (Signed)
Closed

## 2010-10-25 NOTE — Telephone Encounter (Signed)
Pt called and left mess for triage nurse stating she had been put on metformin and her BS's still not controlled and the med is causing diarrhea.  Place on med 11-02-10 when seen. Plan:  Routed to Dr.Metheny Jarvis Newcomer, LPN Domingo Dimes

## 2010-10-26 ENCOUNTER — Telehealth: Payer: Self-pay | Admitting: Family Medicine

## 2010-10-26 MED ORDER — SAXAGLIPTIN-METFORMIN ER 5-500 MG PO TB24: 1.0000 | ORAL_TABLET | ORAL | Status: DC

## 2010-10-26 NOTE — Telephone Encounter (Signed)
Ok will change to [employer]. She is welcome to pick up coupon or samples. See if sugar are under 130 fasting on the new medication.

## 2010-10-26 NOTE — Telephone Encounter (Signed)
Closed

## 2010-10-26 NOTE — Telephone Encounter (Signed)
Pt notified to pup samples of kombiglyze 5/500mg  # 14/ 0 refills.  Script was sent to pt pharm and  Pt aware.  Pt to keep track of the BS's and make sure < 130. Jarvis Newcomer, LPN Dom[facility] Dimes

## 2010-10-29 ENCOUNTER — Telehealth: Payer: Self-pay | Admitting: Family Medicine

## 2010-10-29 NOTE — Telephone Encounter (Signed)
Pt called and picked up the kombiglyze samples that were left up front for her on last Friday.  Pt calling this am to verify the dose she is to take.   Plan:  Samples were the 4 mg and pt instructed to take the same dose she had been taking. Jarvis Newcomer, LPN [facility] Dimes

## 2010-10-31 ENCOUNTER — Inpatient Hospital Stay: Admission: RE | Admit: 2010-10-31 | Payer: BC Managed Care – PPO | Source: Ambulatory Visit

## 2010-11-02 LAB — CBC
HCT: 43.4 % (ref 36.0–46.0)
Platelets: 224 10*3/uL (ref 150–400)
RBC: 4.88 MIL/uL (ref 3.87–5.11)
WBC: 8.8 10*3/uL (ref 4.0–10.5)

## 2010-11-02 LAB — COMPREHENSIVE METABOLIC PANEL
AST: 25 U/L (ref 0–37)
Albumin: 3.9 g/dL (ref 3.5–5.2)
Alkaline Phosphatase: 50 U/L (ref 39–117)
BUN: 8 mg/dL (ref 6–23)
Chloride: 105 mEq/L (ref 96–112)
GFR calc Af Amer: >60 mL/min (ref >60)
Potassium: 3.8 mEq/L (ref 3.5–5.1)
Total Bilirubin: 0.6 mg/dL (ref 0.3–1.2)

## 2010-11-27 ENCOUNTER — Encounter: Payer: BC Managed Care – PPO | Admitting: Obstetrics and Gynecology

## 2010-12-24 ENCOUNTER — Other Ambulatory Visit: Payer: Self-pay | Admitting: *Deleted

## 2010-12-24 MED ORDER — SAXAGLIPTIN-METFORMIN ER 5-500 MG PO TB24: 1.0000 | ORAL_TABLET | ORAL | Status: DC

## 2010-12-25 MED ORDER — SAXAGLIPTIN-METFORMIN ER 5-500 MG PO TB24: 1.0000 | ORAL_TABLET | ORAL | Status: DC

## 2010-12-25 NOTE — Telephone Encounter (Signed)
Addended by: Ellsworth Lennox on: 12/25/2010 04:25 PM   Modules accepted: Orders

## 2010-12-31 ENCOUNTER — Other Ambulatory Visit (HOSPITAL_COMMUNITY)
Admission: RE | Admit: 2010-12-31 | Discharge: 2010-12-31 | Disposition: A | Discharge status: Home / Self Care | Payer: BC Managed Care – PPO | Source: Ambulatory Visit | Attending: Obstetrics and Gynecology | Admitting: Obstetrics and Gynecology

## 2010-12-31 ENCOUNTER — Ambulatory Visit (INDEPENDENT_AMBULATORY_CARE_PROVIDER_SITE_OTHER): Payer: BC Managed Care – PPO | Admitting: Obstetrics and Gynecology

## 2010-12-31 ENCOUNTER — Encounter: Payer: Self-pay | Admitting: Obstetrics and Gynecology

## 2010-12-31 VITALS — BP 150/90 | Ht 64.0 in | Wt 210.0 lb

## 2010-12-31 DIAGNOSIS — Z01419 Encounter for gynecological examination (general) (routine) without abnormal findings: Secondary | ICD-10-CM | POA: Insufficient documentation

## 2010-12-31 DIAGNOSIS — N390 Urinary tract infection, site not specified: Secondary | ICD-10-CM

## 2010-12-31 DIAGNOSIS — R82998 Other abnormal findings in urine: Secondary | ICD-10-CM

## 2010-12-31 NOTE — Progress Notes (Signed)
Addended by: Cammie Mcgee T on: 12/31/2010 05:21 PM   Modules accepted: Orders

## 2010-12-31 NOTE — Progress Notes (Signed)
Patient came back today for her annual GYN exam. She is almost done with her treatment for hepatitis C. She does her lab work elsewhere. She is up-to-date on mammograms. She contraceptive a vasectomy. Earlier this year she has secondary amenorrhea. FSH was borderline. She did not withdraw to progestin. She is now had 2 spontaneous periods in September and October. She is having no pelvic pain.  Physical examination:  Kennon Portela present. HEENT within normal limits. Neck: Thyroid not large. No masses. Supraclavicular nodes: not enlarged. Breasts: Examined in both sitting midline position. No skin changes and no masses. Abdomen: Soft no guarding rebound or masses or hernia. Pelvic: External: Within normal limits. BUS: Within normal limits. Vaginal:within normal limits. Good estrogen effect. No evidence of cystocele rectocele or enterocele. Cervix: clean. Uterus: Normal size and shape. Adnexa: No masses. Rectovaginal exam: Confirmatory and negative. Extremities: Within normal limits.  Assessment: Transitional periods.  Plan: Continue yearly mammograms. If patient goes 3 months without a cycle we will try and withdrawal her again with progestin.

## 2011-01-01 MED ORDER — NITROFURANTOIN MONOHYD MACRO 100 MG PO CAPS: 100.0000 mg | ORAL_CAPSULE | Freq: Two times a day (BID) | ORAL | Status: AC

## 2011-01-01 NOTE — Progress Notes (Signed)
Addended by: Venora Maples on: 01/01/2011 03:31 PM   Modules accepted: Orders

## 2011-01-11 ENCOUNTER — Other Ambulatory Visit (INDEPENDENT_AMBULATORY_CARE_PROVIDER_SITE_OTHER): Payer: BC Managed Care – PPO

## 2011-01-11 DIAGNOSIS — N390 Urinary tract infection, site not specified: Secondary | ICD-10-CM

## 2011-01-14 ENCOUNTER — Telehealth: Payer: Self-pay | Admitting: *Deleted

## 2011-01-14 DIAGNOSIS — N390 Urinary tract infection, site not specified: Secondary | ICD-10-CM

## 2011-01-14 MED ORDER — CIPROFLOXACIN HCL 500 MG PO TABS: 500.0000 mg | ORAL_TABLET | Freq: Two times a day (BID) | ORAL | Status: AC

## 2011-01-14 NOTE — Telephone Encounter (Signed)
Pt informed with UTI from 01/11/11 lab slip. rx sent to pharmacy.

## 2011-01-25 ENCOUNTER — Ambulatory Visit: Payer: BC Managed Care – PPO | Admitting: Obstetrics and Gynecology

## 2011-01-25 DIAGNOSIS — N390 Urinary tract infection, site not specified: Secondary | ICD-10-CM

## 2011-01-30 ENCOUNTER — Other Ambulatory Visit: Payer: Self-pay | Admitting: Family Medicine

## 2011-02-11 ENCOUNTER — Ambulatory Visit
Admission: RE | Admit: 2011-02-11 | Discharge: 2011-02-11 | Disposition: A | Discharge status: Home / Self Care | Payer: No Typology Code available for payment source | Source: Ambulatory Visit | Attending: Infectious Diseases | Admitting: Infectious Diseases

## 2011-02-11 ENCOUNTER — Other Ambulatory Visit: Payer: Self-pay | Admitting: Infectious Diseases

## 2011-02-11 DIAGNOSIS — R7611 Nonspecific reaction to tuberculin skin test without active tuberculosis: Secondary | ICD-10-CM

## 2011-03-28 ENCOUNTER — Telehealth: Payer: Self-pay | Admitting: Family Medicine

## 2011-03-28 NOTE — Telephone Encounter (Signed)
Patient called scheduled appt 04/17/11 and req to get a lab order sent to [facility] on the 14th or 15th for blood work for Thyroid check.Marland KitchenMarland KitchenMarland Kitchen

## 2011-04-02 ENCOUNTER — Telehealth: Payer: Self-pay | Admitting: *Deleted

## 2011-04-02 DIAGNOSIS — E039 Hypothyroidism, unspecified: Secondary | ICD-10-CM

## 2011-04-02 NOTE — Telephone Encounter (Signed)
Pt called and wanted labs sent to lab for TSH

## 2011-04-12 ENCOUNTER — Other Ambulatory Visit: Payer: Self-pay | Admitting: *Deleted

## 2011-04-12 ENCOUNTER — Other Ambulatory Visit: Payer: Self-pay | Admitting: Family Medicine

## 2011-04-12 MED ORDER — LEVOTHYROXINE SODIUM 150 MCG PO TABS: 150.0000 ug | ORAL_TABLET | Freq: Every day | ORAL | Status: DC

## 2011-04-17 ENCOUNTER — Ambulatory Visit (INDEPENDENT_AMBULATORY_CARE_PROVIDER_SITE_OTHER): Payer: BC Managed Care – PPO | Admitting: Family Medicine

## 2011-04-17 ENCOUNTER — Encounter: Payer: Self-pay | Admitting: Family Medicine

## 2011-04-17 VITALS — BP 135/80 | HR 90 | Wt 214.0 lb

## 2011-04-17 DIAGNOSIS — Z13220 Encounter for screening for lipoid disorders: Secondary | ICD-10-CM

## 2011-04-17 DIAGNOSIS — I10 Essential (primary) hypertension: Secondary | ICD-10-CM

## 2011-04-17 DIAGNOSIS — R1310 Dysphagia, unspecified: Secondary | ICD-10-CM

## 2011-04-17 DIAGNOSIS — E119 Type 2 diabetes mellitus without complications: Secondary | ICD-10-CM

## 2011-04-17 MED ORDER — SAXAGLIPTIN-METFORMIN ER 5-1000 MG PO TB24: 1.0000 | ORAL_TABLET | Freq: Every day | ORAL | Status: DC

## 2011-04-17 NOTE — Progress Notes (Signed)
  Subjective:    Patient ID: Pamela Holloway, female    DOB: 1960-07-15, 51 y.o.   MRN: [id number]  Diabetes She presents for her follow-up diabetic visit. She has type 2 diabetes mellitus. Her disease course has been stable. There are no hypoglycemic associated symptoms. Pertinent negatives for diabetes include no blurred vision, no chest pain, no foot paresthesias, no foot ulcerations, no polydipsia, no polyphagia, no polyuria and no visual change. There are no hypoglycemic complications. Symptoms are stable. There are no diabetic complications. She is compliant with treatment all of the time. Her weight is stable. She is following a generally healthy diet. (Not been checking her sugar. ) An ACE inhibitor/angiotensin II receptor blocker is being taken.    She has finished her treatments for her hepatitis in December. She is feeling better. No CP or SOB.  BP is so much better.  Last labs  Review of Systems  Eyes: Negative for blurred vision.  Cardiovascular: Negative for chest pain.  Genitourinary: Negative for polyuria.  Hematological: Negative for polydipsia and polyphagia.       Objective:   Physical Exam  Constitutional: She is oriented to person, place, and time. She appears well-developed and well-nourished.  HENT:  Head: Normocephalic and atraumatic.  Cardiovascular: Normal rate, regular rhythm and normal heart sounds.   Pulmonary/Chest: Effort normal and breath sounds normal.  Neurological: She is alert and oriented to person, place, and time.  Skin: Skin is warm and dry.  Psychiatric: She has a normal mood and affect. Her behavior is normal.          Assessment & Plan:  DM-Eye exam is uptodate. Foot exam today.  Urine micro sent today.  A1C almost at goal. Up from August. Will inc her kombiglyze.  F/U in 3 months. She is getting back into her exercies and diet since she has completed her Hep C treatment.s    Hyperlipidemia - Due to recheck cholesterol. Given lab slip.    Hypothyroidism-Reviwed thyroid results.   Discussed need for colon cancer screening. She wants to hold off on the colonoscopy but is willing to do stool cards for now. She will think about the colonoscopy  Also c/o dysphagia- she was originally scheduled for an endoscopy but lost her job. She has a new job now but has a large detectable. She says she may eventually need a referral to [facility] for an endoscopy. Asked her to call me when she is ready to do this. In the meantime try to make sure she is chewing her food well and eat more soft. She feels like foods are getting stuck at the base of her throat, no every time but frequently.  No nausea or vomiting.  Hx of gastric banding.

## 2011-04-26 ENCOUNTER — Other Ambulatory Visit: Payer: Self-pay | Admitting: Family Medicine

## 2011-05-15 ENCOUNTER — Other Ambulatory Visit: Payer: Self-pay | Admitting: Family Medicine

## 2011-05-16 ENCOUNTER — Other Ambulatory Visit: Payer: Self-pay | Admitting: Family Medicine

## 2011-05-16 LAB — COMPLETE METABOLIC PANEL WITH GFR
ALT: 28 U/L (ref 0–35)
Alkaline Phosphatase: 81 U/L (ref 39–117)
Sodium: 140 mEq/L (ref 135–145)
Total Bilirubin: 1.4 mg/dL — ABNORMAL HIGH (ref 0.3–1.2)
Total Protein: 6.9 g/dL (ref 6.0–8.3)

## 2011-05-16 LAB — LIPID PANEL
HDL: 32 mg/dL — ABNORMAL LOW (ref >39)
Total CHOL/HDL Ratio: 6.7 Ratio

## 2011-05-16 LAB — LDL CHOLESTEROL, DIRECT: Direct LDL: 89 mg/dL

## 2011-05-16 MED ORDER — ATORVASTATIN CALCIUM 40 MG PO TABS: 40.0000 mg | ORAL_TABLET | Freq: Every day | ORAL | Status: DC

## 2011-07-12 ENCOUNTER — Ambulatory Visit: Payer: BC Managed Care – PPO | Admitting: Family Medicine

## 2011-07-19 ENCOUNTER — Ambulatory Visit: Payer: BC Managed Care – PPO | Admitting: Family Medicine

## 2011-07-25 ENCOUNTER — Other Ambulatory Visit: Payer: Self-pay | Admitting: Family Medicine

## 2011-07-30 ENCOUNTER — Telehealth: Payer: Self-pay | Admitting: *Deleted

## 2011-07-30 NOTE — Telephone Encounter (Signed)
Per pt request, Dr Loman Chroman will be drawing labs on pt next week so they are going to draw the lipids and liver panel and fax [place] results.

## 2011-08-02 LAB — LIPID PANEL
Cholesterol: 230 mg/dL — AB (ref 0–200)
Cholesterol: 230 mg/dL — AB (ref 0–200)
HDL: 28 mg/dL — AB (ref 35–70)
Triglycerides: 999 mg/dL — AB (ref 40–160)

## 2011-08-02 LAB — LDL CHOLESTEROL, DIRECT: Direct LDL: 61

## 2011-08-02 LAB — PROTEIN, TOTAL
Albumin: 4.2
Direct LDL: 61

## 2011-08-05 ENCOUNTER — Telehealth: Payer: Self-pay | Admitting: Family Medicine

## 2011-08-05 MED ORDER — OMEGA-3-ACID ETHYL ESTERS 1 G PO CAPS: 2.0000 g | ORAL_CAPSULE | Freq: Two times a day (BID) | ORAL | Status: DC

## 2011-08-05 NOTE — Telephone Encounter (Signed)
Call pt: LDL looks great on the lipitor. Continue current regimen. Her TG were 1218, very very high.  Recommend stop the fish oil and start lovaza.  I will send rx to her pharmacy. It is the equivalent to 16 fish oil tabs in a day.  Sent to [facility] to try for one month.  If works well ( recheck TG in 3-4 weeks ) then can send to mail order. Check online for coupon off the copay or we may have some i the drug closet.  Has a great side effect profie.  Very safe and efficacious.  Liver enzymes look great!

## 2011-08-06 NOTE — Telephone Encounter (Signed)
Pt.notified

## 2011-08-07 ENCOUNTER — Encounter: Payer: Self-pay | Admitting: *Deleted

## 2011-08-09 ENCOUNTER — Encounter: Payer: Self-pay | Admitting: Family Medicine

## 2011-08-09 ENCOUNTER — Ambulatory Visit (INDEPENDENT_AMBULATORY_CARE_PROVIDER_SITE_OTHER): Payer: BC Managed Care – PPO | Admitting: Family Medicine

## 2011-08-09 VITALS — BP 135/85 | HR 87 | Wt 215.0 lb

## 2011-08-09 DIAGNOSIS — E785 Hyperlipidemia, unspecified: Secondary | ICD-10-CM

## 2011-08-09 DIAGNOSIS — R1310 Dysphagia, unspecified: Secondary | ICD-10-CM

## 2011-08-09 DIAGNOSIS — E119 Type 2 diabetes mellitus without complications: Secondary | ICD-10-CM

## 2011-08-09 DIAGNOSIS — I10 Essential (primary) hypertension: Secondary | ICD-10-CM

## 2011-08-09 DIAGNOSIS — Z1211 Encounter for screening for malignant neoplasm of colon: Secondary | ICD-10-CM

## 2011-08-09 MED ORDER — SAXAGLIPTIN-METFORMIN ER 5-1000 MG PO TB24: 1.0000 | ORAL_TABLET | Freq: Every day | ORAL | Status: DC

## 2011-08-09 MED ORDER — HYDROCHLOROTHIAZIDE 12.5 MG PO TABS: 12.5000 mg | ORAL_TABLET | Freq: Every day | ORAL | Status: DC

## 2011-08-09 MED ORDER — LEVOTHYROXINE SODIUM 150 MCG PO TABS: 150.0000 ug | ORAL_TABLET | Freq: Every day | ORAL | Status: DC

## 2011-08-09 NOTE — Progress Notes (Signed)
Subjective:    Patient ID: Pamela Holloway, female    DOB: 1960-09-09, 51 y.o.   MRN: [id number]  Diabetes She presents for her follow-up diabetic visit. She has type 2 diabetes mellitus. Her disease course has been stable. There are no hypoglycemic associated symptoms. There are no diabetic associated symptoms. She is compliant with treatment all of the time. Home blood sugar record trend: Not checking sugar.   An ACE inhibitor/angiotensin II receptor blocker is being taken. Eye exam is current.    HTN - no chest pain or shortness of breath. She does take her medication regularly. has had some ankle swelling this summer. She has used a couple of her mother's Lasix tablets a couple of times and this has really worked well for her. She says she does take a supplement of potassium when she takes the Lasix. She admits her diet has not been the best recently but she has been trying to get regular exercise.  Dysphagia for almost a year.  Says food is stickin gin her upper chest. Sometime hard and soft foods. When gets stuck feels SOB.  EVentually it passes.  Has vomited a couple of times to get it to move.  Says fluids are passing.  Has gastric banding. No change in stools.  She has had loose stools since her gastrib banding.    Review of Systems  BP 135/85  Pulse 87  Wt 215 lb (97.523 kg)    Allergies  Allergen Reactions  . Aspirin     REACTION: lips swell    Past Medical History  Diagnosis Date  . Hepatitis C   . Hypertension   . Thyroid disease     Hypothyroid    Past Surgical History  Procedure Date  . Cholecystectomy   . Tonsillectomy   . Laparoscopic gastric banding     History   Social History  . Marital Status: Married    Spouse Name: N/A    Number of Children: N/A  . Years of Education: N/A   Occupational History  . Not on file.   Social History Main Topics  . Smoking status: Former Games developer  . Smokeless tobacco: Never Used  . Alcohol Use: Yes     rare  . Drug  Use: Not on file  . Sexually Active: Yes    Birth Control/ Protection: Surgical   Other Topics Concern  . Not on file   Social History Narrative  . No narrative on file    Family History  Problem Relation Age of Onset  . Diabetes Mother   . Hypertension Mother   . Heart disease Mother   . Diabetes Father   . Hypertension Father     Outpatient Encounter Prescriptions as of 08/09/2011  Medication Sig Dispense Refill  . Ascorbic Acid (VITAMIN C) 100 MG tablet Take by mouth daily.        Marland Kitchen levothyroxine (SYNTHROID, LEVOTHROID) 150 MCG tablet Take 1 tablet (150 mcg total) by mouth daily.  90 tablet  1  . losartan-hydrochlorothiazide (HYZAAR) 100-12.5 MG per tablet TAKE 1 TABLET DAILY  90 tablet  1  . Multiple Vitamin (MULTIVITAMIN) capsule Take 1 capsule by mouth daily.        Marland Kitchen omega-3 acid ethyl esters (LOVAZA) 1 G capsule Take 2 capsules (2 g total) by mouth 2 (two) times daily.  120 capsule  0  . Saxagliptin-Metformin (KOMBIGLYZE XR) 05-998 MG TB24 Take 1 tablet by mouth daily.  90 tablet  1  .  vitamin E 100 UNIT capsule Take by mouth daily.        Marland Kitchen DISCONTD: atorvastatin (LIPITOR) 40 MG tablet Take 1 tablet (40 mg total) by mouth daily.  30 tablet  2  . DISCONTD: levothyroxine (SYNTHROID, LEVOTHROID) 150 MCG tablet Take 1 tablet (150 mcg total) by mouth daily.  10 tablet  0  . DISCONTD: Saxagliptin-Metformin (KOMBIGLYZE XR) 05-998 MG TB24 Take 1 tablet by mouth daily.  30 tablet  1  . hydrochlorothiazide (HYDRODIURIL) 12.5 MG tablet Take 1 tablet (12.5 mg total) by mouth daily.  90 tablet  1          Objective:   Physical Exam  Constitutional: She is oriented to person, place, and time. She appears well-developed and well-nourished.  HENT:  Head: Normocephalic and atraumatic.  Mouth/Throat: Oropharynx is clear and moist.  Neck: Neck supple.  Cardiovascular: Normal rate, regular rhythm and normal heart sounds.   Pulmonary/Chest: Effort normal and breath sounds normal.    Musculoskeletal: She exhibits no edema.  Neurological: She is alert and oriented to person, place, and time.  Skin: Skin is warm and dry.  Psychiatric: She has a normal mood and affect. Her behavior is normal.          Assessment & Plan:  DM- Well controlled. Continue current regimen. Followup in 4 months. Her eye exam and urine microalbumin exam are all up to date. Kidney function is stable. Continue to work on exercise and diet.  HTN - controlled. Repeat BP is improved.  Will inc hctz to 25mg  bc of ankle swelling and for better BP control. Work on low salt diet.  She has a lot of the losartan hct 100/12.5, which is why I wrote for additional HCTZ instead of changing to losartan HCTZ 200/25.  Dysphagia- Will refer to [facility] for [facility]oscopy.  She was likely have a stricture would benefit from further evaluation. Hx of gastric banding but she is not having any problems with that. Hx fo Hep C    Hypertriglyceridemia - Work on diet. She did pick up the prescription for lovaza and has started it. When she's been on it for about a month I would like to recheck her lipid levels. Her triglycerides were 1000. Direct LDL was normal. Her HDL was low as well and I encouraged her to work on regular exercise.  Discussed need for screening colonoscopy. She declined. I will give her some stool cards as she does agree to do those.

## 2011-08-12 ENCOUNTER — Encounter: Payer: Self-pay | Admitting: *Deleted

## 2011-08-23 ENCOUNTER — Other Ambulatory Visit: Payer: Self-pay | Admitting: Gastroenterology

## 2011-08-23 DIAGNOSIS — R1310 Dysphagia, unspecified: Secondary | ICD-10-CM

## 2011-09-02 ENCOUNTER — Other Ambulatory Visit: Payer: Self-pay | Admitting: Gastroenterology

## 2011-09-02 ENCOUNTER — Ambulatory Visit
Admission: RE | Admit: 2011-09-02 | Discharge: 2011-09-02 | Disposition: A | Discharge status: Home / Self Care | Payer: BC Managed Care – PPO | Source: Ambulatory Visit | Attending: Gastroenterology | Admitting: Gastroenterology

## 2011-09-02 DIAGNOSIS — R1310 Dysphagia, unspecified: Secondary | ICD-10-CM

## 2011-09-06 ENCOUNTER — Other Ambulatory Visit: Payer: Self-pay | Admitting: *Deleted

## 2011-09-06 ENCOUNTER — Telehealth: Payer: Self-pay | Admitting: *Deleted

## 2011-09-06 DIAGNOSIS — E785 Hyperlipidemia, unspecified: Secondary | ICD-10-CM

## 2011-09-06 MED ORDER — OMEGA-3-ACID ETHYL ESTERS 1 G PO CAPS: 2.0000 g | ORAL_CAPSULE | Freq: Two times a day (BID) | ORAL | Status: DC

## 2011-09-13 ENCOUNTER — Other Ambulatory Visit: Payer: Self-pay | Admitting: Family Medicine

## 2011-09-13 LAB — HEMOCCULT GUIAC POC 1CARD (OFFICE)
Card #3 Fecal Occult Blood, POC: NEGATIVE
Fecal Occult Blood, POC: NEGATIVE

## 2011-09-13 NOTE — Addendum Note (Signed)
Addended by: Ellsworth Lennox on: 09/13/2011 03:28 PM   Modules accepted: Orders

## 2011-09-14 LAB — LIPID PANEL
HDL: 36 mg/dL — ABNORMAL LOW (ref >39)
Total CHOL/HDL Ratio: 6.1 Ratio

## 2011-10-02 ENCOUNTER — Other Ambulatory Visit: Payer: Self-pay | Admitting: *Deleted

## 2011-10-02 MED ORDER — OMEGA-3-ACID ETHYL ESTERS 1 G PO CAPS: 2.0000 g | ORAL_CAPSULE | Freq: Two times a day (BID) | ORAL | Status: DC

## 2011-10-10 ENCOUNTER — Other Ambulatory Visit: Payer: Self-pay | Admitting: *Deleted

## 2011-10-10 MED ORDER — OMEGA-3-ACID ETHYL ESTERS 1 G PO CAPS: 2.0000 g | ORAL_CAPSULE | Freq: Two times a day (BID) | ORAL | Status: DC

## 2011-10-16 ENCOUNTER — Encounter: Payer: Self-pay | Admitting: Obstetrics and Gynecology

## 2011-10-16 ENCOUNTER — Ambulatory Visit (INDEPENDENT_AMBULATORY_CARE_PROVIDER_SITE_OTHER): Payer: BC Managed Care – PPO | Admitting: Obstetrics and Gynecology

## 2011-10-16 DIAGNOSIS — R3915 Urgency of urination: Secondary | ICD-10-CM

## 2011-10-16 LAB — URINALYSIS W MICROSCOPIC + REFLEX CULTURE
Casts: NONE SEEN
Glucose, UA: >1000 mg/dL — AB
Protein, ur: NEGATIVE mg/dL
Specific Gravity, Urine: 1.015 (ref 1.005–1.030)

## 2011-10-16 NOTE — Patient Instructions (Signed)
We will let you know about culture results.

## 2011-10-16 NOTE — Progress Notes (Signed)
The patient came to see me today with a one-week history of odor in her urine, change in color of urine, slight dysuria and urgency. Her urinalysis today was mostly unremarkable except for many bacteria. She also had glycosuria but is on diabetic medication. Her symptoms are mild enough that we have decided to wait for urine culture results to treat. We will inform her.

## 2011-10-18 ENCOUNTER — Other Ambulatory Visit: Payer: Self-pay | Admitting: Obstetrics and Gynecology

## 2011-10-18 DIAGNOSIS — N390 Urinary tract infection, site not specified: Secondary | ICD-10-CM

## 2011-10-18 MED ORDER — NITROFURANTOIN MONOHYD MACRO 100 MG PO CAPS: 100.0000 mg | ORAL_CAPSULE | Freq: Two times a day (BID) | ORAL | Status: AC

## 2011-10-19 LAB — URINE CULTURE: Colony Count: >=100000

## 2011-10-29 ENCOUNTER — Other Ambulatory Visit: Payer: BC Managed Care – PPO

## 2011-10-29 DIAGNOSIS — N390 Urinary tract infection, site not specified: Secondary | ICD-10-CM

## 2011-11-01 ENCOUNTER — Other Ambulatory Visit: Payer: Self-pay | Admitting: Obstetrics and Gynecology

## 2011-11-01 DIAGNOSIS — N390 Urinary tract infection, site not specified: Secondary | ICD-10-CM

## 2011-11-01 LAB — URINE CULTURE: Colony Count: >=100000

## 2011-11-01 MED ORDER — CIPROFLOXACIN HCL 500 MG PO TABS: 500.0000 mg | ORAL_TABLET | Freq: Two times a day (BID) | ORAL | Status: DC

## 2011-11-14 ENCOUNTER — Other Ambulatory Visit: Payer: Self-pay | Admitting: *Deleted

## 2011-11-14 MED ORDER — LOSARTAN POTASSIUM-HCTZ 100-12.5 MG PO TABS: 1.0000 | ORAL_TABLET | Freq: Every day | ORAL | Status: DC

## 2011-11-15 ENCOUNTER — Other Ambulatory Visit: Payer: BC Managed Care – PPO

## 2011-11-15 DIAGNOSIS — N390 Urinary tract infection, site not specified: Secondary | ICD-10-CM

## 2011-11-18 ENCOUNTER — Other Ambulatory Visit: Payer: Self-pay

## 2011-11-18 MED ORDER — LOSARTAN POTASSIUM-HCTZ 100-12.5 MG PO TABS: 1.0000 | ORAL_TABLET | Freq: Every day | ORAL | Status: DC

## 2011-12-01 ENCOUNTER — Encounter: Payer: Self-pay | Admitting: Family Medicine

## 2011-12-19 ENCOUNTER — Ambulatory Visit (INDEPENDENT_AMBULATORY_CARE_PROVIDER_SITE_OTHER): Payer: BC Managed Care – PPO | Admitting: Family Medicine

## 2011-12-19 ENCOUNTER — Encounter: Payer: Self-pay | Admitting: Family Medicine

## 2011-12-19 VITALS — BP 160/89 | HR 83 | Temp 98.3°F | Wt 211.0 lb

## 2011-12-19 DIAGNOSIS — J209 Acute bronchitis, unspecified: Secondary | ICD-10-CM

## 2011-12-19 MED ORDER — AZITHROMYCIN 250 MG PO TABS: ORAL_TABLET | ORAL | Status: AC

## 2011-12-19 MED ORDER — AZITHROMYCIN 250 MG PO TABS: ORAL_TABLET | ORAL | Status: DC

## 2011-12-19 MED ORDER — GUAIFENESIN ER 600 MG PO TB12: 1200.0000 mg | ORAL_TABLET | Freq: Two times a day (BID) | ORAL | Status: DC | PRN

## 2011-12-19 NOTE — Progress Notes (Signed)
CC: Pamela Holloway is a 51 y.o. female is here for not feeling well since mon   Subjective: HPI: Little over one week of productive cough seems to be getting worse on a daily basis.  Worse first thing in the morning but persists through day. Associated with chills/sweats and subjective fever since Monday.  Cough episodes yesterday had posttussive gagging but she is yet to vomit. She's had decreased appetite but is enjoying juices and water without discomfort. She's had some mild clear nasal discharge. She denies facial pressure headaches hearing is somewhat muffled but without ear pain. She's been trying Echinacea and pseudoephedrine but no other over-the-counter products. She denies shortness of breath, wheezing, chest pain, back pain, abdominal pain, rashes, dizziness or confusion    Review Of Systems Outlined In HPI  Past Medical History  Diagnosis Date  . Hepatitis C   . Hypertension   . Thyroid disease     Hypothyroid     Family History  Problem Relation Age of Onset  . Diabetes Mother   . Hypertension Mother   . Heart disease Mother   . Diabetes Father   . Hypertension Father      History  Substance Use Topics  . Smoking status: Former Games developer  . Smokeless tobacco: Never Used  . Alcohol Use: Yes     Comment: rare     Objective: Filed Vitals:   12/19/11 1111  BP: 160/89  Pulse: 83  Temp: 98.3 F (36.8 C)    General: Alert and Oriented, No Acute Distress HEENT: Pupils equal, round, reactive to light. Conjunctivae clear.  External ears unremarkable, canals clear with intact TMs with appropriate landmarks.  Middle ear appears open without effusion. Pink inferior turbinates.  Moist mucous membranes, pharynx without inflammation nor lesions.  Neck supple without palpable lymphadenopathy nor abnormal masses. Lungs: Comfortable work of breathing, good air movement, moderate central rhonchi without wheezing nor rales. No signs of consolidation  Cardiac: Regular rate and  rhythm. Normal S1/S2.  No murmurs, rubs, nor gallops.     Assessment & Plan: Pamela Holloway was seen today for not feeling well since mon.  Diagnoses and associated orders for this visit:  Acute bronchitis - Discontinue: azithromycin (ZITHROMAX) 250 MG tablet; Take two tabs at once on day 1, then one tab daily on days 2-5. - guaiFENesin (MUCINEX) 600 MG 12 hr tablet; Take 2 tablets (1,200 mg total) by mouth 2 (two) times daily as needed for congestion. - azithromycin (ZITHROMAX) 250 MG tablet; Take two tabs at once on day 1, then one tab daily on days 2-5.    Posttussive gagging and worsening cough with fevers and chills concerning for acute rhonchi this, discussed low but present suspicion for whooping cough. Symptomatic control with guaifenesin, fluids, decongestant of choice. Signs and symptoms requring emergent/urgent reevaluation were discussed with the patient. Suspect blood pressure today may be influenced by pseudoephedrine, asked to return after resolution illness for blood pressure check.  Return if symptoms worsen or fail to improve.

## 2012-01-06 ENCOUNTER — Telehealth: Payer: Self-pay | Admitting: *Deleted

## 2012-01-06 DIAGNOSIS — B1710 Acute hepatitis C without hepatic coma: Secondary | ICD-10-CM

## 2012-01-06 DIAGNOSIS — I10 Essential (primary) hypertension: Secondary | ICD-10-CM

## 2012-01-06 DIAGNOSIS — E785 Hyperlipidemia, unspecified: Secondary | ICD-10-CM

## 2012-01-06 DIAGNOSIS — E039 Hypothyroidism, unspecified: Secondary | ICD-10-CM

## 2012-01-06 NOTE — Telephone Encounter (Signed)
Labs entered.

## 2012-01-20 ENCOUNTER — Other Ambulatory Visit: Payer: Self-pay | Admitting: Family Medicine

## 2012-01-21 LAB — BASIC METABOLIC PANEL WITH GFR
BUN: 9 mg/dL (ref 6–23)
Calcium: 9.6 mg/dL (ref 8.4–10.5)
Creat: 0.67 mg/dL (ref 0.50–1.10)
GFR, Est African American: >89 mL/min
GFR, Est Non African American: >89 mL/min
Glucose, Bld: 196 mg/dL — ABNORMAL HIGH (ref 70–99)
Potassium: 3.6 mEq/L (ref 3.5–5.3)

## 2012-01-21 LAB — HEPATITIS C ANTIBODY: HCV Ab: REACTIVE — AB

## 2012-01-21 LAB — HEPATIC FUNCTION PANEL
ALT: 65 U/L — ABNORMAL HIGH (ref 0–35)
AST: 42 U/L — ABNORMAL HIGH (ref 0–37)
Albumin: 4.4 g/dL (ref 3.5–5.2)
Alkaline Phosphatase: 72 U/L (ref 39–117)
Total Bilirubin: 1 mg/dL (ref 0.3–1.2)
Total Protein: 6.8 g/dL (ref 6.0–8.3)

## 2012-01-21 LAB — LDL CHOLESTEROL, DIRECT: Direct LDL: 100 mg/dL — ABNORMAL HIGH

## 2012-01-21 LAB — LIPID PANEL
HDL: 32 mg/dL — ABNORMAL LOW (ref >39)
Total CHOL/HDL Ratio: 6.8 Ratio
Triglycerides: 741 mg/dL — ABNORMAL HIGH (ref <150)

## 2012-01-24 ENCOUNTER — Ambulatory Visit (INDEPENDENT_AMBULATORY_CARE_PROVIDER_SITE_OTHER): Payer: BC Managed Care – PPO | Admitting: Family Medicine

## 2012-01-24 ENCOUNTER — Encounter: Payer: Self-pay | Admitting: Family Medicine

## 2012-01-24 VITALS — BP 158/92 | HR 72 | Resp 16 | Wt 218.0 lb

## 2012-01-24 DIAGNOSIS — E785 Hyperlipidemia, unspecified: Secondary | ICD-10-CM

## 2012-01-24 DIAGNOSIS — E669 Obesity, unspecified: Secondary | ICD-10-CM

## 2012-01-24 DIAGNOSIS — R7611 Nonspecific reaction to tuberculin skin test without active tuberculosis: Secondary | ICD-10-CM | POA: Insufficient documentation

## 2012-01-24 DIAGNOSIS — I10 Essential (primary) hypertension: Secondary | ICD-10-CM

## 2012-01-24 DIAGNOSIS — E118 Type 2 diabetes mellitus with unspecified complications: Secondary | ICD-10-CM | POA: Insufficient documentation

## 2012-01-24 MED ORDER — SAXAGLIPTIN-METFORMIN ER 5-1000 MG PO TB24: 1.0000 | ORAL_TABLET | Freq: Every day | ORAL | Status: DC

## 2012-01-24 MED ORDER — LEVOTHYROXINE SODIUM 150 MCG PO TABS: 150.0000 ug | ORAL_TABLET | Freq: Every day | ORAL | Status: DC

## 2012-01-24 MED ORDER — OMEGA-3-ACID ETHYL ESTERS 1 G PO CAPS: 2.0000 g | ORAL_CAPSULE | Freq: Two times a day (BID) | ORAL | Status: DC

## 2012-01-24 MED ORDER — HYDROCHLOROTHIAZIDE 12.5 MG PO TABS: 12.5000 mg | ORAL_TABLET | Freq: Every day | ORAL | Status: DC

## 2012-01-24 MED ORDER — LOSARTAN POTASSIUM-HCTZ 100-12.5 MG PO TABS: 1.0000 | ORAL_TABLET | Freq: Every day | ORAL | Status: DC

## 2012-01-24 MED ORDER — GLIPIZIDE 10 MG PO TABS: 10.0000 mg | ORAL_TABLET | Freq: Two times a day (BID) | ORAL | Status: DC

## 2012-01-24 NOTE — Progress Notes (Signed)
Quick Note:  All labs are normal. ______ 

## 2012-01-24 NOTE — Progress Notes (Signed)
  Subjective:    Patient ID: Pamela Holloway, female    DOB: 16-Dec-1960, 51 y.o.   MRN: [id number]  HPI HTN -  Pt denies chest pain, SOB, dizziness, or heart palpitations.  Taking meds as directed w/o problems.  Denies medication side effects. Tood Alevel last night for HA so thinks that is why BP is up.    Hyperlpidemia - Says her Lovaza will need PA. She's not currently on a statin. She taken Lipitor in the past and had myalgias and side effects has been very hesitant to start a different statin.  DM - Was doing zumba but has not for the last monht. Took a stressful job but plans on stepping down bc of the stress. Says hasn't been eating well either as well.  No hypoglycemic episodes. No cuts or wounds or sores that are not healing well.   Review of Systems     Objective:   Physical Exam  Constitutional: She is oriented to person, place, and time. She appears well-developed and well-nourished.  HENT:  Head: Normocephalic and atraumatic.  Cardiovascular: Normal rate, regular rhythm and normal heart sounds.   Pulmonary/Chest: Effort normal and breath sounds normal.  Neurological: She is alert and oriented to person, place, and time.  Skin: Skin is warm and dry.  Psychiatric: She has a normal mood and affect. Her behavior is normal.          Assessment & Plan:  HTN- Uncontrolled. Repeat was better but still not at goal.  Maybe be from teh Aleve took last night. [facility] Recheck pressure followup. Make sure eating a low salt diet. It has been the holiday says she's been eating things that she doesn't normally.  Hyperlipidemia- LDL down to 100. Improved. Will need ot PA on her lovaza. Explained based on guideloines, she really needs to be on a statin. She was on Lipitor previously and had myalgias and side effects. She's very concerned about starting another statin. I strongly encouraged her to think about it we discussed the potential risk reduction for heart disease  and stroke. She also knows she needs to work on weight loss and exercise and change her diet and. She says she will work on this over the next few months.  DM- uncontrolled. Add glipizide to her regimen. Medications and to [facility]. Continue work on diet and exercise. Followup in 3 months. Call if any concerns or having any hypoglycemic events. She is on a low protein in the urine which is unusual since her A1c is elevated. We will consider rechecking again in 6 months as her A1c hopefully improves. She declined pneumonia vaccine today.  Obesity-I. do think that she can get back into her routine make a big difference in her blood sugars as well as her blood pressure and cholesterol control.  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 She did bring a letter from health department stating that they felt that she was low risk or TB. This was added to her chart.

## 2012-01-28 ENCOUNTER — Ambulatory Visit (INDEPENDENT_AMBULATORY_CARE_PROVIDER_SITE_OTHER): Payer: BC Managed Care – PPO | Admitting: Obstetrics and Gynecology

## 2012-01-28 ENCOUNTER — Encounter: Payer: Self-pay | Admitting: Obstetrics and Gynecology

## 2012-01-28 VITALS — BP 138/86 | Ht 63.0 in | Wt 184.0 lb

## 2012-01-28 DIAGNOSIS — Z01419 Encounter for gynecological examination (general) (routine) without abnormal findings: Secondary | ICD-10-CM

## 2012-01-28 DIAGNOSIS — N912 Amenorrhea, unspecified: Secondary | ICD-10-CM

## 2012-01-28 MED ORDER — MEDROXYPROGESTERONE ACETATE 10 MG PO TABS: 10.0000 mg | ORAL_TABLET | Freq: Every day | ORAL | Status: DC

## 2012-01-28 NOTE — Patient Instructions (Signed)
Call for lab results. Continue yearly mammograms.

## 2012-01-28 NOTE — Progress Notes (Signed)
Patient came to see me today for her annual GYN exam. Last year she was having amenorrhea And hot flashes and we checked an [facility] which was borderline at 12. We did give her Provera withdrawal and she did bleed. She is now not had a menstrual cycle since March but is no longer having hot flashes. Her husband has had a vasectomy. She continues yearly mammograms. She had a Pap smear last year which was normal. She has always had normal Pap smears. She is having no pelvic pain. She does her lab through PCP.  Physical examination:Rachel Hyacinth Meeker present. HEENT within normal limits. Neck: Thyroid not large. No masses. Supraclavicular nodes: not enlarged. Breasts: Examined in both sitting and lying  position. No skin changes and no masses. Abdomen: Soft no guarding rebound or masses or hernia. Pelvic: External: Within normal limits. BUS: Within normal limits. Vaginal:within normal limits. Good estrogen effect. No evidence of cystocele rectocele or enterocele. Cervix: clean. Uterus: Normal size and shape. Adnexa: No masses. Rectovaginal exam: Confirmatory and negative. Extremities: Within normal limits.  Assessment: Secondary amenorrhea  Plan: FSH rechecked. She will call for results. Provera 10 mg daily for 5 days. If FSH is premenopausal I have told her to continue Provera withdrawal every 3 months. Pap not done.The new Pap smear guidelines were discussed with the patient.

## 2012-01-29 LAB — URINALYSIS W MICROSCOPIC + REFLEX CULTURE
Bacteria, UA: NONE SEEN
Bilirubin Urine: NEGATIVE
Casts: NONE SEEN
Crystals: NONE SEEN
Ketones, ur: NEGATIVE mg/dL
Specific Gravity, Urine: 1.017 (ref 1.005–1.030)
Squamous Epithelial / LPF: NONE SEEN
Urobilinogen, UA: 0.2 mg/dL (ref 0.0–1.0)
pH: 5.5 (ref 5.0–8.0)

## 2012-04-17 ENCOUNTER — Telehealth: Payer: Self-pay

## 2012-04-17 DIAGNOSIS — E119 Type 2 diabetes mellitus without complications: Secondary | ICD-10-CM

## 2012-04-17 DIAGNOSIS — I10 Essential (primary) hypertension: Secondary | ICD-10-CM

## 2012-04-17 NOTE — Telephone Encounter (Signed)
Pamela Holloway is coming in next Tuesday for labs. What does she need?

## 2012-04-17 NOTE — Telephone Encounter (Signed)
Patient advised and faxed order down.

## 2012-04-17 NOTE — Telephone Encounter (Signed)
Ok for CMP and A1C?

## 2012-04-21 LAB — COMPLETE METABOLIC PANEL WITH GFR
AST: 25 U/L (ref 0–37)
Albumin: 4.4 g/dL (ref 3.5–5.2)
Alkaline Phosphatase: 70 U/L (ref 39–117)
BUN: 12 mg/dL (ref 6–23)
Calcium: 9.4 mg/dL (ref 8.4–10.5)
Chloride: 103 mEq/L (ref 96–112)
Creat: 0.85 mg/dL (ref 0.50–1.10)
GFR, Est Non African American: 78 mL/min
Glucose, Bld: 175 mg/dL — ABNORMAL HIGH (ref 70–99)
Potassium: 3.9 mEq/L (ref 3.5–5.3)

## 2012-04-21 LAB — HEMOGLOBIN A1C
Hgb A1c MFr Bld: 7.3 % — ABNORMAL HIGH (ref <5.7)
Mean Plasma Glucose: 163 mg/dL — ABNORMAL HIGH (ref <117)

## 2012-04-23 ENCOUNTER — Ambulatory Visit (INDEPENDENT_AMBULATORY_CARE_PROVIDER_SITE_OTHER): Payer: BC Managed Care – PPO | Admitting: Family Medicine

## 2012-04-23 ENCOUNTER — Ambulatory Visit (INDEPENDENT_AMBULATORY_CARE_PROVIDER_SITE_OTHER): Payer: BC Managed Care – PPO

## 2012-04-23 ENCOUNTER — Encounter: Payer: Self-pay | Admitting: Family Medicine

## 2012-04-23 VITALS — BP 151/82 | HR 72 | Ht 64.0 in | Wt 218.0 lb

## 2012-04-23 DIAGNOSIS — I10 Essential (primary) hypertension: Secondary | ICD-10-CM

## 2012-04-23 DIAGNOSIS — M25551 Pain in right hip: Secondary | ICD-10-CM

## 2012-04-23 DIAGNOSIS — M25559 Pain in unspecified hip: Secondary | ICD-10-CM

## 2012-04-23 MED ORDER — CANAGLIFLOZIN 300 MG PO TABS: 300.0000 mg | ORAL_TABLET | Freq: Every day | ORAL | Status: DC

## 2012-04-23 MED ORDER — LEVOTHYROXINE SODIUM 150 MCG PO TABS: 150.0000 ug | ORAL_TABLET | Freq: Every day | ORAL | Status: DC

## 2012-04-23 MED ORDER — HYDROCHLOROTHIAZIDE 12.5 MG PO TABS: 12.5000 mg | ORAL_TABLET | Freq: Every day | ORAL | Status: DC

## 2012-04-23 MED ORDER — OMEGA-3-ACID ETHYL ESTERS 1 G PO CAPS: 2.0000 g | ORAL_CAPSULE | Freq: Two times a day (BID) | ORAL | Status: DC

## 2012-04-23 MED ORDER — GLIPIZIDE 10 MG PO TABS: 10.0000 mg | ORAL_TABLET | Freq: Two times a day (BID) | ORAL | Status: DC

## 2012-04-23 MED ORDER — LOSARTAN POTASSIUM-HCTZ 100-12.5 MG PO TABS: 1.0000 | ORAL_TABLET | Freq: Every day | ORAL | Status: DC

## 2012-04-23 MED ORDER — SAXAGLIPTIN-METFORMIN ER 5-1000 MG PO TB24: 1.0000 | ORAL_TABLET | Freq: Every day | ORAL | Status: DC

## 2012-04-23 NOTE — Progress Notes (Signed)
  Subjective:    Patient ID: MYLI PAE, female    DOB: 1960/10/04, 52 y.o.   MRN: [id number]  HPI DM - Home AM sugars have been running 190-200s.   No hypoglycemic events. She's been taking her medications regularly without any problems or side effects. She has not been working out for the last 6 weeks. Previous to that she was walking on the treadmill at the gym. She know she needs to work on losing weight struggling with that right now. Her son also recently had surgery and she is helping to take care of him.  Right hip pain x6 weeks. She says she woke up one morning with it and it was painful to get out of bed. She denies any known trauma or injury. No prior history of problems with that hip. She does report a prior history of some osteoporosis in her left hip. She has tried Aleve and Advil without any significant relief. No clicking, catching, popping. It's worse when she sits for long periods and with walking. She says if she coughs or sneezes the pain will actually radiate from the outer hip across the groin crease. No real alleviating symptoms.   Review of Systems     Objective:   Physical Exam  Constitutional: She is oriented to person, place, and time. She appears well-developed and well-nourished.  HENT:  Head: Normocephalic and atraumatic.  Neck: Neck supple. No thyromegaly present.  Cardiovascular: Normal rate, regular rhythm and normal heart sounds.   Pulmonary/Chest: Effort normal and breath sounds normal.  Musculoskeletal:  Right hip with pain with flexion against resistance. Pain with log roll internally. Pain with FADIR test, non with FABER test.  Nontender over the greater trochanter. She has good strength with abduction and abduction . Nontender of the low back.  Lymphadenopathy:    She has no cervical adenopathy.  Neurological: She is alert and oriented to person, place, and time.  Skin: Skin is warm and dry.  Psychiatric: She has a normal mood and affect. Her  behavior is normal.          Assessment & Plan:  DM- Improved, though uncontrolled. I'm happy with the progress that she's made aware still not quite at goal. We discussed getting back into an exercise and diet routine. Unfortunately her hip it kept her from exercising recently so does not need to address that and get her better. For now we discussed options of starting an injection in addition to her current medications. She really does not want to do that so we will start Invokana.  Warned about potential side effects including increased risk of urinary tract infections. We will have to monitor that for this.  Right hip pain-with her history of osteoporosis with pain radiating around to the groin crease and would like to get an x-ray today. No known trauma though. Consider that this could be coming from her low back as well. Pain is predominantly above the greater trochanter.  Hypertension-uncontrolled-it looks much better 3 months ago. She has been taking NSAIDs which might be contributing to elevated pressures. At this point I will off on making any changes and recheck in 3 months. Continue current regimen. Make sure eating low salt diet and continue work on weight loss as this is a huge contribute to her blood pressure.

## 2012-04-23 NOTE — Patient Instructions (Addendum)
Go for xray of your hip.   Start exercise for your hip

## 2012-05-21 ENCOUNTER — Other Ambulatory Visit: Payer: Self-pay | Admitting: *Deleted

## 2012-05-21 MED ORDER — CANAGLIFLOZIN 300 MG PO TABS: 300.0000 mg | ORAL_TABLET | Freq: Every day | ORAL | Status: DC

## 2012-05-26 ENCOUNTER — Telehealth: Payer: Self-pay | Admitting: *Deleted

## 2012-05-26 NOTE — Telephone Encounter (Signed)
Prior auth obtained for Lovaza. [facility] notified to rerun med. Barry Dienes, LPN

## 2012-07-20 ENCOUNTER — Telehealth: Payer: Self-pay | Admitting: Family Medicine

## 2012-07-20 NOTE — Telephone Encounter (Signed)
Please call patient: She is due for her diabetic followup after June 27. So she can schedule that after her vacation. We can do blood work at that time. She had also noted that she felt she might be getting a bladder infection. We can put her on either my scheduler Jade schedule for Friday morning for this. She also wanted me to address her hip pain. I would like to get her in with our sports medicine specialists. Especially since her x-ray was normal. This could be more muscular. His name is Dr. Rodney Langton and he is here in our office.

## 2012-07-22 NOTE — Telephone Encounter (Signed)
lvm for return call.Sharlyne Koeneman Lynetta  

## 2012-07-22 NOTE — Telephone Encounter (Signed)
Pt stated that she does not feel that her pain is muscular and the pain is more in the groin area than the hip.  She stated that she has a friend that is in PT has looked at it. Dr. Karie Schwalbe did not have any openings on Friday and she cannot get in tomorrow so she will see Pamela Holloway and f/u.Loralee Pacas [place]

## 2012-07-24 ENCOUNTER — Encounter: Payer: Self-pay | Admitting: Physician Assistant

## 2012-07-24 ENCOUNTER — Ambulatory Visit (INDEPENDENT_AMBULATORY_CARE_PROVIDER_SITE_OTHER): Payer: BC Managed Care – PPO | Admitting: Physician Assistant

## 2012-07-24 VITALS — BP 144/73 | HR 78 | Wt 216.0 lb

## 2012-07-24 DIAGNOSIS — R82998 Other abnormal findings in urine: Secondary | ICD-10-CM

## 2012-07-24 DIAGNOSIS — M25551 Pain in right hip: Secondary | ICD-10-CM

## 2012-07-24 DIAGNOSIS — M25559 Pain in unspecified hip: Secondary | ICD-10-CM

## 2012-07-24 DIAGNOSIS — R8290 Unspecified abnormal findings in urine: Secondary | ICD-10-CM

## 2012-07-24 DIAGNOSIS — R3989 Other symptoms and signs involving the genitourinary system: Secondary | ICD-10-CM

## 2012-07-24 DIAGNOSIS — N390 Urinary tract infection, site not specified: Secondary | ICD-10-CM

## 2012-07-24 LAB — POCT URINALYSIS DIPSTICK
Bilirubin, UA: NEGATIVE
Ketones, UA: NEGATIVE
Leukocytes, UA: NEGATIVE
Spec Grav, UA: 1.02

## 2012-07-24 MED ORDER — TRAMADOL HCL 50 MG PO TABS: 50.0000 mg | ORAL_TABLET | Freq: Three times a day (TID) | ORAL | Status: DC | PRN

## 2012-07-24 MED ORDER — SULFAMETHOXAZOLE-TRIMETHOPRIM 800-160 MG PO TABS: 1.0000 | ORAL_TABLET | Freq: Two times a day (BID) | ORAL | Status: DC

## 2012-07-24 NOTE — Progress Notes (Signed)
  Subjective:    Patient ID: Pamela Holloway, female    DOB: 1960-08-15, 52 y.o.   MRN: [id number]  HPI Patient presents to the clinic with 2 weeks of low back pain, urgency, and frequency. Has a hx of UTI's. Last UTI had to be treated with 3 rounds of abx. Denies any fever, chills, muscle aches, n/v/d. She feels weak and achy. Leaving to go on vacation today.   Pt also request something for pain to get her through vacation until she can get an MRI on her right hip. She has been seeing Dr. Linford Arnold for her right hip pain. Xrays were normal. Ibuprofen is not helping. Needs MRI. States supposed to be order after vacation. Would like something to help make her vacation more enjoyable.    Review of Systems     Objective:   Physical Exam  Constitutional: She is oriented to person, place, and time. She appears well-nourished.  HENT:  Head: Normocephalic and atraumatic.  Cardiovascular: Normal rate, regular rhythm and normal heart sounds.   Pulmonary/Chest: Effort normal and breath sounds normal. She has no wheezes.  CVA tenderness.  Abdominal: Soft. Bowel sounds are normal. She exhibits no mass. There is no tenderness. There is no guarding.  Neurological: She is alert and oriented to person, place, and time.  Skin: Skin is warm and dry.  Psychiatric: She has a normal mood and affect. Her behavior is normal.          Assessment & Plan:  UTI- positive for blood, leuks, and nitrates. Treated with bactrim. Will culture to make sure bacteria susceptible to abx. Gave HO. Needs to follow up after finishing abx to make sure infection has completely cleared with repeat urine dipstick.   Right hip pain- Gave tramadol to help with pain on vacation. Pt aware she needs to follow up with Dr. Linford Arnold for ongoing issues.

## 2012-07-24 NOTE — Patient Instructions (Addendum)
Urinary Tract Infection  Urinary tract infections (UTIs) can develop anywhere along your urinary tract. Your urinary tract is your body's drainage system for removing wastes and extra water. Your urinary tract includes two kidneys, two ureters, a bladder, and a urethra. Your kidneys are a pair of bean-shaped organs. Each kidney is about the size of your fist. They are located below your ribs, one on each side of your spine.  CAUSES  Infections are caused by microbes, which are microscopic organisms, including fungi, viruses, and bacteria. These organisms are so small that they can only be seen through a microscope. Bacteria are the microbes that most commonly cause UTIs.  SYMPTOMS   Symptoms of UTIs may vary by age and gender of the patient and by the location of the infection. Symptoms in young women typically include a frequent and intense urge to urinate and a painful, burning feeling in the bladder or urethra during urination. Older women and men are more likely to be tired, shaky, and weak and have muscle aches and abdominal pain. A fever may mean the infection is in your kidneys. Other symptoms of a kidney infection include pain in your back or sides below the ribs, nausea, and vomiting.  DIAGNOSIS  To diagnose a UTI, your caregiver will ask you about your symptoms. Your caregiver also will ask to provide a urine sample. The urine sample will be tested for bacteria and white blood cells. White blood cells are made by your body to help fight infection.  TREATMENT   Typically, UTIs can be treated with medication. Because most UTIs are caused by a bacterial infection, they usually can be treated with the use of antibiotics. The choice of antibiotic and length of treatment depend on your symptoms and the type of bacteria causing your infection.  HOME CARE INSTRUCTIONS   If you were prescribed antibiotics, take them exactly as your caregiver instructs you. Finish the medication even if you feel better after you  have only taken some of the medication.   Drink enough water and fluids to keep your urine clear or pale yellow.   Avoid caffeine, tea, and carbonated beverages. They tend to irritate your bladder.   Empty your bladder often. Avoid holding urine for long periods of time.   Empty your bladder before and after sexual intercourse.   After a bowel movement, women should cleanse from front to back. Use each tissue only once.  SEEK MEDICAL CARE IF:    You have back pain.   You develop a fever.   Your symptoms do not begin to resolve within 3 days.  SEEK IMMEDIATE MEDICAL CARE IF:    You have severe back pain or lower abdominal pain.   You develop chills.   You have nausea or vomiting.   You have continued burning or discomfort with urination.  MAKE SURE YOU:    Understand these instructions.   Will watch your condition.   Will get help right away if you are not doing well or get worse.  Document Released: 10/24/2004 Document Revised: 07/16/2011 Document Reviewed: 02/22/2011  Exit[employer] Patient Information 2014 [employer].

## 2012-07-27 LAB — URINE CULTURE

## 2012-08-04 ENCOUNTER — Telehealth: Payer: Self-pay | Admitting: *Deleted

## 2012-08-04 ENCOUNTER — Telehealth: Payer: Self-pay | Admitting: Family Medicine

## 2012-08-04 DIAGNOSIS — M5126 Other intervertebral disc displacement, lumbar region: Secondary | ICD-10-CM

## 2012-08-04 DIAGNOSIS — M25551 Pain in right hip: Secondary | ICD-10-CM

## 2012-08-04 NOTE — Telephone Encounter (Signed)
Prior auth obtained through [employer].  Auth# [id number].

## 2012-08-04 NOTE — Telephone Encounter (Signed)
Order placed for mri of lumbar spine.Pamela Holloway

## 2012-08-06 ENCOUNTER — Other Ambulatory Visit: Payer: Self-pay

## 2012-08-07 ENCOUNTER — Telehealth: Payer: Self-pay

## 2012-08-07 ENCOUNTER — Encounter: Payer: Self-pay | Admitting: Physician Assistant

## 2012-08-07 ENCOUNTER — Ambulatory Visit (INDEPENDENT_AMBULATORY_CARE_PROVIDER_SITE_OTHER): Payer: BC Managed Care – PPO | Admitting: Physician Assistant

## 2012-08-07 VITALS — BP 154/82 | HR 81 | Temp 97.6°F | Wt 212.0 lb

## 2012-08-07 DIAGNOSIS — R82998 Other abnormal findings in urine: Secondary | ICD-10-CM

## 2012-08-07 DIAGNOSIS — R319 Hematuria, unspecified: Secondary | ICD-10-CM

## 2012-08-07 DIAGNOSIS — M25551 Pain in right hip: Secondary | ICD-10-CM

## 2012-08-07 DIAGNOSIS — M25559 Pain in unspecified hip: Secondary | ICD-10-CM

## 2012-08-07 LAB — POCT URINALYSIS DIPSTICK
Ketones, UA: NEGATIVE
Leukocytes, UA: NEGATIVE
Nitrite, UA: NEGATIVE
Urobilinogen, UA: 1
pH, UA: 6

## 2012-08-07 MED ORDER — MELOXICAM 15 MG PO TABS: 15.0000 mg | ORAL_TABLET | Freq: Every day | ORAL | Status: DC

## 2012-08-07 NOTE — Progress Notes (Signed)
  Subjective:    Patient ID: Pamela Holloway, female    DOB: 1960/08/16, 52 y.o.   MRN: [id number]  HPI Patient presents to the clinic to follow up on UTI. She has a history of UTI's not being cleared with one abx. She denies any symptoms today such as pain, pressure, frequency. She has noticed urine has had a dark color.  She still has right hip pain. She is taking ibuprofen and helping some. She was scheduled for an MRI but cannot afford it right now and would like to try other options. Xrays were normal. Pain in hip is ongoing and hurts with any movement. No known trauma.    Review of Systems     Objective:   Physical Exam  Constitutional: She is oriented to person, place, and time. She appears well-developed and well-nourished.  HENT:  Head: Normocephalic and atraumatic.  Cardiovascular: Normal rate, regular rhythm and normal heart sounds.   Pulmonary/Chest: Effort normal and breath sounds normal.  Neurological: She is alert and oriented to person, place, and time.  Psychiatric: She has a normal mood and affect. Her behavior is normal.          Assessment & Plan:  Dark urine/hematuria- UA positive for blood but no leuks or nitrates. Glucose and protein present. Urine culture ordered as well as microscopic reflex. Pt aware of need for diabetes control. Will schedule appt with Dr. Linford Arnold. Will call with results.   Right hip pain- since does not want to get MRI at this time due to cost. Will order 4 weeks of PT. Gave Dr. Melvia Heaps card and told to consider appt in 2 weeks if not improving at all. He could consider injections to help with pain. Gave Mobic to take daily for next 2-4 weeks.

## 2012-08-07 NOTE — Patient Instructions (Addendum)
Be looking for PT appt. Schedule appt with Dr. Karie Schwalbe in next 1-2 weeks. Start mobic daily.

## 2012-08-07 NOTE — Telephone Encounter (Signed)
FYI  She cancelled her MRI.

## 2012-08-08 ENCOUNTER — Ambulatory Visit (HOSPITAL_BASED_OUTPATIENT_CLINIC_OR_DEPARTMENT_OTHER): Payer: BC Managed Care – PPO

## 2012-08-10 ENCOUNTER — Other Ambulatory Visit: Payer: Self-pay | Admitting: Physician Assistant

## 2012-08-11 LAB — URINALYSIS, ROUTINE W REFLEX MICROSCOPIC
Hgb urine dipstick: NEGATIVE
Ketones, ur: NEGATIVE mg/dL
Leukocytes, UA: NEGATIVE
Nitrite: NEGATIVE
Protein, ur: NEGATIVE mg/dL
Urobilinogen, UA: 0.2 mg/dL (ref 0.0–1.0)
pH: 6 (ref 5.0–8.0)

## 2012-08-11 LAB — URINALYSIS, MICROSCOPIC ONLY

## 2012-08-12 ENCOUNTER — Ambulatory Visit (INDEPENDENT_AMBULATORY_CARE_PROVIDER_SITE_OTHER): Payer: BC Managed Care – PPO | Admitting: Physical Therapy

## 2012-08-14 ENCOUNTER — Encounter: Payer: BC Managed Care – PPO | Admitting: Physical Therapy

## 2012-08-14 DIAGNOSIS — M6281 Muscle weakness (generalized): Secondary | ICD-10-CM

## 2012-08-14 DIAGNOSIS — M2560 Stiffness of unspecified joint, not elsewhere classified: Secondary | ICD-10-CM

## 2012-08-14 DIAGNOSIS — M25559 Pain in unspecified hip: Secondary | ICD-10-CM

## 2012-08-19 ENCOUNTER — Encounter: Payer: BC Managed Care – PPO | Admitting: Physical Therapy

## 2012-08-19 DIAGNOSIS — M25559 Pain in unspecified hip: Secondary | ICD-10-CM

## 2012-08-19 DIAGNOSIS — M2560 Stiffness of unspecified joint, not elsewhere classified: Secondary | ICD-10-CM

## 2012-08-19 DIAGNOSIS — M6281 Muscle weakness (generalized): Secondary | ICD-10-CM

## 2012-08-21 ENCOUNTER — Encounter (INDEPENDENT_AMBULATORY_CARE_PROVIDER_SITE_OTHER): Payer: BC Managed Care – PPO | Admitting: Physical Therapy

## 2012-08-21 ENCOUNTER — Encounter: Payer: Self-pay | Admitting: Family Medicine

## 2012-08-21 ENCOUNTER — Ambulatory Visit (INDEPENDENT_AMBULATORY_CARE_PROVIDER_SITE_OTHER): Payer: BC Managed Care – PPO | Admitting: Family Medicine

## 2012-08-21 VITALS — BP 123/73 | HR 86 | Wt 213.0 lb

## 2012-08-21 DIAGNOSIS — M6281 Muscle weakness (generalized): Secondary | ICD-10-CM

## 2012-08-21 DIAGNOSIS — IMO0001 Reserved for inherently not codable concepts without codable children: Secondary | ICD-10-CM

## 2012-08-21 DIAGNOSIS — E669 Obesity, unspecified: Secondary | ICD-10-CM

## 2012-08-21 DIAGNOSIS — M25559 Pain in unspecified hip: Secondary | ICD-10-CM

## 2012-08-21 DIAGNOSIS — M2560 Stiffness of unspecified joint, not elsewhere classified: Secondary | ICD-10-CM

## 2012-08-21 DIAGNOSIS — I10 Essential (primary) hypertension: Secondary | ICD-10-CM

## 2012-08-21 DIAGNOSIS — E039 Hypothyroidism, unspecified: Secondary | ICD-10-CM

## 2012-08-21 MED ORDER — GLIPIZIDE 10 MG PO TABS: 10.0000 mg | ORAL_TABLET | Freq: Two times a day (BID) | ORAL | Status: DC

## 2012-08-21 MED ORDER — LOSARTAN POTASSIUM-HCTZ 100-12.5 MG PO TABS: 1.0000 | ORAL_TABLET | Freq: Every day | ORAL | Status: DC

## 2012-08-21 MED ORDER — METFORMIN HCL 1000 MG PO TABS: 1000.0000 mg | ORAL_TABLET | Freq: Two times a day (BID) | ORAL | Status: DC

## 2012-08-21 MED ORDER — CANAGLIFLOZIN 300 MG PO TABS: 300.0000 mg | ORAL_TABLET | Freq: Every day | ORAL | Status: DC

## 2012-08-21 MED ORDER — OMEGA-3-ACID ETHYL ESTERS 1 G PO CAPS: 2.0000 g | ORAL_CAPSULE | Freq: Two times a day (BID) | ORAL | Status: DC

## 2012-08-21 MED ORDER — HYDROCHLOROTHIAZIDE 12.5 MG PO TABS: 12.5000 mg | ORAL_TABLET | Freq: Every day | ORAL | Status: DC

## 2012-08-21 MED ORDER — LEVOTHYROXINE SODIUM 150 MCG PO TABS: 150.0000 ug | ORAL_TABLET | Freq: Every day | ORAL | Status: DC

## 2012-08-21 NOTE — Progress Notes (Signed)
  Subjective:    Patient ID: Pamela Holloway, female    DOB: Mar 25, 1960, 52 y.o.   MRN: [id number]  HPI DM- no hyperglycemic events overall doing well. She's tolerating the Invokana well. This is a new medication for her. Her medications have been quite expensive though. Urine albumin is up-to-date.  HTN-  Pt denies chest pain, SOB, dizziness, or heart palpitations.  Taking meds as directed w/o problems.  Denies medication side effects.   Obesity-she's recently started working on her weight again. She feels like her thyroid is under good control. She has not been able to exercise because of her hip. She recently saw [facility] for this. They put her in physical therapy and she said she is improving but slowly. In fact she had physical therapy this morning. Review of Systems     Objective:   Physical Exam  Constitutional: She is oriented to person, place, and time. She appears well-developed and well-nourished.  HENT:  Head: Normocephalic and atraumatic.  Cardiovascular: Normal rate, regular rhythm and normal heart sounds.   Pulmonary/Chest: Effort normal and breath sounds normal.  Abdominal: Soft. Bowel sounds are normal. She exhibits no distension and no mass. There is no tenderness. There is no rebound and no guarding.  Neurological: She is alert and oriented to person, place, and time.  Skin: Skin is warm and dry.  Psychiatric: She has a normal mood and affect. Her behavior is normal.          Assessment & Plan:  DM - well controlle.d will stop the onglyza. F/U in 3 months.   Should hopefully save her a lot of money. Her A1c may go up a little but it should still be a coupon card for Imdur, today. Also given samples of Lovaza today.  Monofilament exam performed today. She declined pneumonia vaccine again today.  Hypertension-well controlled. Looks absolutely fantastic. Continue current regimen. She does have blood work done with Dr. rotation. Hopefully there'll for a copy of this over to  me.  Hyperthyroidism-overall she's doing well. She fell she is asymptomatic. Last level was in December.  Hip pain-continue to work with physical therapy. She is making some improvement. Consider MRI/imaging if she is not continuing to improve.

## 2012-08-24 ENCOUNTER — Telehealth: Payer: Self-pay | Admitting: *Deleted

## 2012-08-24 MED ORDER — CANAGLIFLOZIN 300 MG PO TABS: 300.0000 mg | ORAL_TABLET | Freq: Every day | ORAL | Status: DC

## 2012-08-24 NOTE — Telephone Encounter (Signed)
Pt calls and states her mail order pharm will not take the coupon card for the Invokanna at all. Wants [place] to send rx to [facility] in [place].She has put the rx sent to [facility] on hold.

## 2012-08-28 ENCOUNTER — Encounter (INDEPENDENT_AMBULATORY_CARE_PROVIDER_SITE_OTHER): Payer: BC Managed Care – PPO | Admitting: Physical Therapy

## 2012-08-28 DIAGNOSIS — M25559 Pain in unspecified hip: Secondary | ICD-10-CM

## 2012-08-28 DIAGNOSIS — M6281 Muscle weakness (generalized): Secondary | ICD-10-CM

## 2012-08-28 DIAGNOSIS — M2560 Stiffness of unspecified joint, not elsewhere classified: Secondary | ICD-10-CM

## 2012-09-08 ENCOUNTER — Encounter: Payer: Self-pay | Admitting: *Deleted

## 2012-11-23 ENCOUNTER — Ambulatory Visit: Payer: BC Managed Care – PPO | Admitting: Family Medicine

## 2012-11-27 ENCOUNTER — Ambulatory Visit (INDEPENDENT_AMBULATORY_CARE_PROVIDER_SITE_OTHER): Payer: BC Managed Care – PPO | Admitting: Women's Health

## 2012-11-27 ENCOUNTER — Encounter: Payer: Self-pay | Admitting: Women's Health

## 2012-11-27 DIAGNOSIS — N9489 Other specified conditions associated with female genital organs and menstrual cycle: Secondary | ICD-10-CM

## 2012-11-27 DIAGNOSIS — N949 Unspecified condition associated with female genital organs and menstrual cycle: Secondary | ICD-10-CM

## 2012-11-27 LAB — WET PREP FOR TRICH, YEAST, CLUE
Trich, Wet Prep: NONE SEEN
WBC, Wet Prep HPF POC: NONE SEEN
Yeast Wet Prep HPF POC: NONE SEEN

## 2012-11-27 MED ORDER — FLUCONAZOLE 150 MG PO TABS: 150.0000 mg | ORAL_TABLET | Freq: Once | ORAL | Status: DC

## 2012-11-27 MED ORDER — NYSTATIN-TRIAMCINOLONE 100000-0.1 UNIT/GM-% EX OINT: TOPICAL_OINTMENT | Freq: Two times a day (BID) | CUTANEOUS | Status: DC

## 2012-11-27 NOTE — Progress Notes (Signed)
Patient ID: Pamela Holloway, female   DOB: 04/01/1960, 52 y.o.   MRN: [id number] Presents with complaint of vaginal dryness,irritation with itching without relief with Monistat for the past week. Has irritation in both groin area and states feels like labia is swollen. Amenorrheic with an elevated FSH 2013. Type 2 diabetes last hemoglobin A1c much better of 5.8, had been 7.3.  Exam: Appears well, external genitalia extremely erythematous and edematous. Right and left groin area erythemic. Speculum exam vaginal walls are erythematous minimal discharge, wet prep negative.  External yeast vaginitis partially resolved with Monistat  Plan:  Diflucan 150 by mouth times one dose with 1 refill. Mycolog- apply externally to groins and external genitalia at  bedtime. Dry loose clothes, call if no relief. Vaginal lubricants with intercourse.

## 2012-12-02 ENCOUNTER — Other Ambulatory Visit: Payer: Self-pay | Admitting: Women's Health

## 2012-12-02 ENCOUNTER — Telehealth: Payer: Self-pay | Admitting: *Deleted

## 2012-12-02 DIAGNOSIS — N949 Unspecified condition associated with female genital organs and menstrual cycle: Secondary | ICD-10-CM

## 2012-12-02 MED ORDER — FLUCONAZOLE 150 MG PO TABS: 150.0000 mg | ORAL_TABLET | Freq: Once | ORAL | Status: DC

## 2012-12-02 NOTE — Telephone Encounter (Signed)
Pt called c/o same symptoms from OV 11/27/12, pt was told to call back if no relief. Please advise

## 2012-12-02 NOTE — Telephone Encounter (Signed)
Telephone call, states it is about 50% better, swelling has decreased, itching less, burning persist. Will continue Mycolog, refills Diflucan. Call if no relief in next week.

## 2012-12-07 ENCOUNTER — Telehealth: Payer: Self-pay | Admitting: *Deleted

## 2012-12-07 NOTE — Telephone Encounter (Signed)
Pt aware you are out of the office) pt called to follow up from telephone encounter 12/02/12 still using cream took diflucan and still having vaginal burning and irritation. Please advise

## 2012-12-08 ENCOUNTER — Encounter: Payer: Self-pay | Admitting: Family Medicine

## 2012-12-08 ENCOUNTER — Ambulatory Visit (INDEPENDENT_AMBULATORY_CARE_PROVIDER_SITE_OTHER): Payer: BC Managed Care – PPO | Admitting: Family Medicine

## 2012-12-08 VITALS — BP 139/80 | HR 70 | Wt 217.0 lb

## 2012-12-08 DIAGNOSIS — M2550 Pain in unspecified joint: Secondary | ICD-10-CM

## 2012-12-08 DIAGNOSIS — I10 Essential (primary) hypertension: Secondary | ICD-10-CM

## 2012-12-08 LAB — CBC
Hemoglobin: 17.7 g/dL — ABNORMAL HIGH (ref 12.0–15.0)
MCH: 31.3 pg (ref 26.0–34.0)
MCV: 89.2 fL (ref 78.0–100.0)
RBC: 5.65 MIL/uL — ABNORMAL HIGH (ref 3.87–5.11)
RDW: 14.4 % (ref 11.5–15.5)
WBC: 7.2 10*3/uL (ref 4.0–10.5)

## 2012-12-08 LAB — COMPLETE METABOLIC PANEL WITH GFR
ALT: 50 U/L — ABNORMAL HIGH (ref 0–35)
CO2: 21 mEq/L (ref 19–32)
Calcium: 9.4 mg/dL (ref 8.4–10.5)
Chloride: 102 mEq/L (ref 96–112)
Creat: 0.78 mg/dL (ref 0.50–1.10)
GFR, Est African American: >89 mL/min
Potassium: 3.9 mEq/L (ref 3.5–5.3)
Sodium: 138 mEq/L (ref 135–145)
Total Protein: 7.3 g/dL (ref 6.0–8.3)

## 2012-12-08 LAB — URIC ACID: Uric Acid, Serum: 5.6 mg/dL (ref 2.4–7.0)

## 2012-12-08 LAB — POCT UA - MICROALBUMIN: Creatinine, POC: 300 mg/dL

## 2012-12-08 NOTE — Progress Notes (Signed)
Subjective:    Patient ID: Pamela Holloway, female    DOB: 10-14-1960, 52 y.o.   MRN: [id number]  HPI DM-no hypoglycemic event. No wounds or sores that are not healing well. Taking glipizide, Invokana, metformin. Admits she has been eating a fair amount helping candy. She's worried that her A1c will be up-to-date.  HTN-  Pt denies chest pain, SOB, dizziness, or heart palpitations.  Taking meds as directed w/o problems.  Denies medication side effects.]  Joint pain in ankles ,knees, shoulder, elbows.  occassaion in fingers.  No swelling in the joints recently. Knees used ot swell.  Father with gout.  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  No family history of rheumatoid arthritis.  Review of Systems  BP 139/80  Pulse 70  Wt 217 lb (98.431 kg)  LMP 09/08/2011    Allergies  Allergen Reactions  . Aspirin     REACTION: lips swell  . Atorvastatin Other (See Comments)    Myalgia     Past Medical History  Diagnosis Date  . Hepatitis C   . Hypertension   . Thyroid disease     Hypothyroid    Past Surgical History  Procedure Laterality Date  . Cholecystectomy    . Tonsillectomy    . Laparoscopic gastric banding      History   Social History  . Marital Status: Married    Spouse Name: N/A    Number of Children: N/A  . Years of Education: N/A   Occupational History  . Not on file.   Social History Main Topics  . Smoking status: Former Games developer  . Smokeless tobacco: Never Used  . Alcohol Use: Yes     Comment: rare  . Drug Use: Not on file  . Sexual Activity: Yes    Birth Control/ Protection: Surgical     Comment: vasectomy   Other Topics Concern  . Not on file   Social History Narrative  . No narrative on file    Family History  Problem Relation Age of Onset  . Diabetes Mother   . Hypertension Mother   . Heart disease Mother   . Diabetes Father   . Hypertension Father     Outpatient Encounter Prescriptions as  of 12/08/2012  Medication Sig  . Canagliflozin (INVOKANA) 300 MG TABS Take 300 mg by mouth daily.  . fluconazole (DIFLUCAN) 150 MG tablet Take 1 tablet (150 mg total) by mouth once.  Marland Kitchen glipiZIDE (GLUCOTROL) 10 MG tablet Take 1 tablet (10 mg total) by mouth 2 (two) times daily.  . hydrochlorothiazide (HYDRODIURIL) 12.5 MG tablet Take 1 tablet (12.5 mg total) by mouth daily.  Marland Kitchen levothyroxine (SYNTHROID, LEVOTHROID) 150 MCG tablet Take 1 tablet (150 mcg total) by mouth daily.  Marland Kitchen losartan-hydrochlorothiazide (HYZAAR) 100-12.5 MG per tablet Take 1 tablet by mouth daily.  . metFORMIN (GLUCOPHAGE) 1000 MG tablet Take 1 tablet (1,000 mg total) by mouth 2 (two) times daily with a meal.  . Multiple Vitamin (MULTIVITAMIN) capsule Take 1 capsule by mouth daily.    Marland Kitchen nystatin-triamcinolone ointment (MYCOLOG) Apply topically 2 (two) times daily.  Marland Kitchen omega-3 acid ethyl esters (LOVAZA) 1 G capsule Take 2 capsules (2 g total) by mouth 2 (two) times daily.  . vitamin E 100 UNIT capsule Take by mouth daily.            Objective:   Physical Exam  Constitutional: She is oriented to person, place, and time.  She appears well-developed and well-nourished.  HENT:  Head: Normocephalic and atraumatic.  Cardiovascular: Normal rate, regular rhythm and normal heart sounds.   Pulmonary/Chest: Effort normal and breath sounds normal.  Neurological: She is alert and oriented to person, place, and time.  Skin: Skin is warm and dry.  Psychiatric: She has a normal mood and affect. Her behavior is normal.          Assessment & Plan:  Diabetes-  Well controlled.  on ARB not on a statin. She's been intolerant to Lipitor in the past. Followup in 3 months.  HTN- Well controlled today. Continue current regimen.   Polyarthralgia-will check labs for any indication of autoimmune cause for polyarthralgia. Minimize artificial sweeteners OCC tender causing joint issues. Also making sure getting regular exercise and healthy  diet. She certainly could have some osteoarthritis. We'll also check a uric acid level to evaluate for gout and she does have a family history.  Due for screening colonoscopy - given stool cards Declinedd pneumonia and flu vaccines.  Will call to get eye exam. She says done at [facility] earlier this year.

## 2012-12-08 NOTE — Telephone Encounter (Signed)
Message left

## 2012-12-09 ENCOUNTER — Other Ambulatory Visit: Payer: Self-pay

## 2012-12-09 LAB — CYCLIC CITRUL PEPTIDE ANTIBODY, IGG: Cyclic Citrullin Peptide Ab: <2 U/mL (ref 0.0–5.0)

## 2012-12-09 MED ORDER — LEVOTHYROXINE SODIUM 150 MCG PO TABS: 150.0000 ug | ORAL_TABLET | Freq: Every day | ORAL | Status: DC

## 2012-12-09 MED ORDER — OMEGA-3-ACID ETHYL ESTERS 1 G PO CAPS: 2.0000 g | ORAL_CAPSULE | Freq: Two times a day (BID) | ORAL | Status: DC

## 2012-12-09 MED ORDER — LOSARTAN POTASSIUM-HCTZ 100-12.5 MG PO TABS: 1.0000 | ORAL_TABLET | Freq: Every day | ORAL | Status: DC

## 2012-12-09 MED ORDER — CANAGLIFLOZIN 300 MG PO TABS: 300.0000 mg | ORAL_TABLET | Freq: Every day | ORAL | Status: DC

## 2012-12-09 MED ORDER — METFORMIN HCL 1000 MG PO TABS: 1000.0000 mg | ORAL_TABLET | Freq: Two times a day (BID) | ORAL | Status: DC

## 2012-12-09 MED ORDER — GLIPIZIDE 10 MG PO TABS: 10.0000 mg | ORAL_TABLET | Freq: Two times a day (BID) | ORAL | Status: DC

## 2012-12-09 MED ORDER — HYDROCHLOROTHIAZIDE 12.5 MG PO TABS: 12.5000 mg | ORAL_TABLET | Freq: Every day | ORAL | Status: DC

## 2012-12-09 NOTE — Telephone Encounter (Signed)
Telephone call, states symptoms much better swelling resolved, burning better, still has some redness. Will continue with Mycolog at  bedtime and call if not completely better in one week.

## 2013-02-01 ENCOUNTER — Other Ambulatory Visit: Payer: Self-pay | Admitting: Gynecology

## 2013-02-01 ENCOUNTER — Ambulatory Visit: Payer: BC Managed Care – PPO

## 2013-02-01 DIAGNOSIS — Z1231 Encounter for screening mammogram for malignant neoplasm of breast: Secondary | ICD-10-CM

## 2013-02-17 ENCOUNTER — Ambulatory Visit (HOSPITAL_BASED_OUTPATIENT_CLINIC_OR_DEPARTMENT_OTHER)
Admission: RE | Admit: 2013-02-17 | Discharge: 2013-02-17 | Disposition: A | Discharge status: Home / Self Care | Payer: BC Managed Care – PPO | Source: Ambulatory Visit | Attending: Gynecology | Admitting: Gynecology

## 2013-02-17 DIAGNOSIS — Z1231 Encounter for screening mammogram for malignant neoplasm of breast: Secondary | ICD-10-CM | POA: Insufficient documentation

## 2013-02-26 ENCOUNTER — Other Ambulatory Visit (HOSPITAL_COMMUNITY)
Admission: RE | Admit: 2013-02-26 | Discharge: 2013-02-26 | Disposition: A | Discharge status: Home / Self Care | Payer: BC Managed Care – PPO | Source: Ambulatory Visit | Attending: Gynecology | Admitting: Gynecology

## 2013-02-26 ENCOUNTER — Encounter: Payer: Self-pay | Admitting: Gynecology

## 2013-02-26 ENCOUNTER — Ambulatory Visit (INDEPENDENT_AMBULATORY_CARE_PROVIDER_SITE_OTHER): Payer: BC Managed Care – PPO | Admitting: Gynecology

## 2013-02-26 VITALS — BP 124/82 | Ht 63.5 in | Wt 211.0 lb

## 2013-02-26 DIAGNOSIS — N898 Other specified noninflammatory disorders of vagina: Secondary | ICD-10-CM

## 2013-02-26 DIAGNOSIS — N906 Unspecified hypertrophy of vulva: Secondary | ICD-10-CM

## 2013-02-26 DIAGNOSIS — Z01419 Encounter for gynecological examination (general) (routine) without abnormal findings: Secondary | ICD-10-CM | POA: Insufficient documentation

## 2013-02-26 DIAGNOSIS — N841 Polyp of cervix uteri: Secondary | ICD-10-CM

## 2013-02-26 DIAGNOSIS — L292 Pruritus vulvae: Secondary | ICD-10-CM

## 2013-02-26 DIAGNOSIS — L293 Anogenital pruritus, unspecified: Secondary | ICD-10-CM

## 2013-02-26 LAB — WET PREP FOR TRICH, YEAST, CLUE
CLUE CELLS WET PREP: NONE SEEN
Trich, Wet Prep: NONE SEEN

## 2013-02-26 MED ORDER — FLUCONAZOLE 200 MG PO TABS: 200.0000 mg | ORAL_TABLET | Freq: Every day | ORAL | Status: DC

## 2013-02-26 NOTE — Addendum Note (Signed)
Addended by: Nelva Nay on: 02/26/2013 10:02 AM   Modules accepted: Orders

## 2013-02-26 NOTE — Patient Instructions (Signed)
Take Diflucan pill twice daily for 5 days. Apply the Mycolog cream twice daily to the affected area. Followup in one month for reexamination. Schedule colonoscopy.

## 2013-02-26 NOTE — Progress Notes (Addendum)
Pamela Holloway 1960/11/10 [id number]        53 y.o.  [id number] for annual exam.  Former patient of Dr. Cherylann Banas. Several issues below.  Past medical history,surgical history, problem list, medications, allergies, family history and social history were all reviewed and documented in the EPIC chart.  ROS:  Performed and pertinent positives and negatives are included in the history, assessment and plan .  Exam: [name] Filed Vitals:   02/26/13 0900  BP: 124/82  Height: 5' 3.5" (1.613 m)  Weight: 211 lb (95.709 kg)   General appearance  Normal Skin grossly normal Head/Neck normal with no cervical or supraclavicular adenopathy thyroid normal Lungs  clear Cardiac RR, without RMG Abdominal  soft, nontender, without masses, organomegaly or hernia Breasts  examined lying and sitting without masses, retractions, discharge or axillary adenopathy. Pelvic  Ext/BUS/vagina  generalized erythematous vulvitis. Hyperkeratotic area with skin cracking left perineal region above the anus. Right labial hypertrophy from clitoral hood to perineal body. White vaginal discharge  Cervix  small endocervical polyp extruding from the external os  Uterus  axial to anteverted, normal size, shape and contour, midline and mobile nontender   Adnexa  Without masses or tenderness    Anus and perineum  Normal   Rectovaginal  Normal sphincter tone without palpated masses or tenderness.   Procedure: Small endocervical polyp was biopsied off with Kevorkian biopsy instrument. Scant bleeding afterwards requiring no treatment.  Assessment/Plan:  53 y.o. [id number] female for annual exam.   1. Postmenopausal. Patient without significant hot flushes, night sweats, vaginal dryness or dyspareunia. No vaginal bleeding. Will continue to monitor. Call if any bleeding. 2. Perineal/vulvar pruritus. For approximately one month. Exam consistent with an inflammatory vulvitis and some hyperkeratotic cracking area on the perineal body.  Wet prep is positive for yeast. Will treat aggressively with Diflucan 200 mg daily for 5 days and Mycolog twice daily. Followup appointment one month regardless for reexamination. If hyperkeratotic/cracking area continues then we'll biopsy. Patient knows the importance of followup. 3. Classic endocervical polyp, biopsied off. Patient will followup for pathology results. 4. Right labial hypertrophy. Has always been this way. Became more exaggerated after pregnancies. Remains stable. Options for reduction labioplasty versus observation reviewed. Patient's comfortable with just monitoring as it is not causing her any issues. 5. Pap smear 2012. Pap smear done today. No history of significant abnormal Pap smears. Plan every 3 years screening is normal. 6. Mammography 01/2013. Continue with annual mammography. SBE monthly reviewed. 7. DEXA never. We'll plan further into the menopause. Increase calcium and vitamin D recommendations reviewed. 8. Colonoscopy never. Does see a gastroenterologist for reflux. He has recommended and encouraged colonoscopy. I also recommended and encouraged colonoscopy and she agrees to followup with him. 9. Health maintenance. No blood work done as she has this all done through her primary physician's office. Followup for reexamination in one month.   Note: This document was prepared with digital dictation and possible smart phrase technology. Any transcriptional errors that result from this process are unintentional.   [name], 9:36 AM 02/26/2013

## 2013-02-27 LAB — URINALYSIS W MICROSCOPIC + REFLEX CULTURE
Bilirubin Urine: NEGATIVE
Casts: NONE SEEN
Crystals: NONE SEEN
Hgb urine dipstick: NEGATIVE
Ketones, ur: NEGATIVE mg/dL
LEUKOCYTES UA: NEGATIVE
Nitrite: POSITIVE — AB
PROTEIN: NEGATIVE mg/dL
UROBILINOGEN UA: 0.2 mg/dL (ref 0.0–1.0)
pH: 5.5 (ref 5.0–8.0)

## 2013-03-01 ENCOUNTER — Telehealth: Payer: Self-pay

## 2013-03-01 ENCOUNTER — Other Ambulatory Visit: Payer: Self-pay | Admitting: Gynecology

## 2013-03-01 LAB — URINE CULTURE

## 2013-03-01 MED ORDER — SULFAMETHOXAZOLE-TMP DS 800-160 MG PO TABS: 1.0000 | ORAL_TABLET | Freq: Two times a day (BID) | ORAL | Status: DC

## 2013-03-01 MED ORDER — FLUCONAZOLE 200 MG PO TABS: 200.0000 mg | ORAL_TABLET | Freq: Every day | ORAL | Status: DC

## 2013-03-01 NOTE — Telephone Encounter (Signed)
Okay 

## 2013-03-01 NOTE — Telephone Encounter (Signed)
Rx sent to local pharmacy.  

## 2013-03-01 NOTE — Telephone Encounter (Signed)
Went to pharmacy Sat and Rx not there.  It was sent to her mail order [facility].  I called there to try and cancel but they say it was sent out on 02/27/13.  Can take a week to reach her.  Patient asked if I would just go ahead and call it in to her local pharmacy so she could have it now.  Ok?  She will end up with two Rx's.

## 2013-03-15 ENCOUNTER — Ambulatory Visit: Payer: BC Managed Care – PPO | Admitting: Family Medicine

## 2013-03-31 ENCOUNTER — Ambulatory Visit (INDEPENDENT_AMBULATORY_CARE_PROVIDER_SITE_OTHER): Payer: BC Managed Care – PPO | Admitting: Gynecology

## 2013-03-31 ENCOUNTER — Encounter: Payer: Self-pay | Admitting: Gynecology

## 2013-03-31 DIAGNOSIS — N760 Acute vaginitis: Secondary | ICD-10-CM

## 2013-03-31 DIAGNOSIS — N762 Acute vulvitis: Secondary | ICD-10-CM

## 2013-03-31 MED ORDER — NYSTATIN-TRIAMCINOLONE 100000-0.1 UNIT/GM-% EX OINT: 1.0000 "application " | TOPICAL_OINTMENT | Freq: Two times a day (BID) | CUTANEOUS | Status: DC

## 2013-03-31 NOTE — Progress Notes (Signed)
Pamela Holloway [birth year] [id number]        53 y.o.  [name] presents with history of intense inflammatory vulvitis with hyperkeratotic cracking in the perineal body 02/26/2013. Was treated aggressively for yeast with both oral Diflucan and Mycolog cream. I asked her to return regardless for reinspection given the significance of her exam with the possibility of biopsy if she had any persistent abnormalities. Patient knows that she feels 100% better and that her symptoms are totally gone.  Past medical history,surgical history, problem list, medications, allergies, family history and social history were all reviewed and documented in the EPIC chart.  Exam: Pamela Holloway General appearance  Normal External BUS vagina normal with right labial hypertrophy as always. Vaginal exam normal. Cervix normal. Uterus normal size midline mobile nontender. Adnexa without masses or tenderness.  Assessment/Plan:  53 y.o. [id number] history of intense Candida vulvovaginitis successfully treated with complete resolution of her symptoms. I did refill her Mycolog with one tube and one refill to have available of this coming year she has any recurrence of asked her to aggressively treat before her symptoms get worse. Otherwise she will followup in January 2016 when due for annual exam.   Note: This document was prepared with digital dictation and possible smart phrase technology. Any transcriptional errors that result from this process are unintentional.   Pamela Auerbach MD, 4:13 PM 03/31/2013

## 2013-04-12 ENCOUNTER — Encounter: Payer: Self-pay | Admitting: Family Medicine

## 2013-04-12 ENCOUNTER — Ambulatory Visit: Payer: BC Managed Care – PPO | Admitting: Family Medicine

## 2013-04-12 ENCOUNTER — Ambulatory Visit (INDEPENDENT_AMBULATORY_CARE_PROVIDER_SITE_OTHER): Payer: BC Managed Care – PPO | Admitting: Family Medicine

## 2013-04-12 VITALS — BP 130/88 | HR 88 | Wt 211.0 lb

## 2013-04-12 DIAGNOSIS — E1165 Type 2 diabetes mellitus with hyperglycemia: Principal | ICD-10-CM

## 2013-04-12 DIAGNOSIS — E039 Hypothyroidism, unspecified: Secondary | ICD-10-CM

## 2013-04-12 DIAGNOSIS — B1710 Acute hepatitis C without hepatic coma: Secondary | ICD-10-CM

## 2013-04-12 DIAGNOSIS — E785 Hyperlipidemia, unspecified: Secondary | ICD-10-CM

## 2013-04-12 DIAGNOSIS — IMO0001 Reserved for inherently not codable concepts without codable children: Secondary | ICD-10-CM

## 2013-04-12 LAB — POCT GLYCOSYLATED HEMOGLOBIN (HGB A1C): Hemoglobin A1C: 5.8

## 2013-04-12 MED ORDER — GLIPIZIDE 10 MG PO TABS: 10.0000 mg | ORAL_TABLET | Freq: Two times a day (BID) | ORAL | Status: DC

## 2013-04-12 MED ORDER — OMEGA-3-ACID ETHYL ESTERS 1 G PO CAPS: 2.0000 g | ORAL_CAPSULE | Freq: Two times a day (BID) | ORAL | Status: DC

## 2013-04-12 MED ORDER — LOSARTAN POTASSIUM-HCTZ 100-25 MG PO TABS: 1.0000 | ORAL_TABLET | Freq: Every day | ORAL | Status: DC

## 2013-04-12 MED ORDER — LEVOTHYROXINE SODIUM 150 MCG PO TABS: 150.0000 ug | ORAL_TABLET | Freq: Every day | ORAL | Status: DC

## 2013-04-12 MED ORDER — LOSARTAN POTASSIUM-HCTZ 100-12.5 MG PO TABS: 1.0000 | ORAL_TABLET | Freq: Every day | ORAL | Status: DC

## 2013-04-12 MED ORDER — METFORMIN HCL 1000 MG PO TABS: 1000.0000 mg | ORAL_TABLET | Freq: Two times a day (BID) | ORAL | Status: DC

## 2013-04-12 MED ORDER — CANAGLIFLOZIN 300 MG PO TABS: 300.0000 mg | ORAL_TABLET | Freq: Every day | ORAL | Status: DC

## 2013-04-12 NOTE — Progress Notes (Signed)
Subjective:    Patient ID: Pamela Holloway, female    DOB: 1960-06-01, 53 y.o.   MRN: [id number]  HPI Diabetes - no hypoglycemic events. No wounds or sores that are not healing well. No increased thirst or urination. Checking glucose at home. Taking medications as prescribed without any side effects. Had a couple of yeast infections.   Hypertension- Pt denies chest pain, SOB, dizziness, or heart palpitations.  Taking meds as directed w/o problems.  Denies medication side effects.     Hyperlipidemia-on statin. She's been intolerant to Lipitor.  Hepatitis C-no abdominal pain nausea or vomiting. She is due to repeat liver enzymes. Review of Systems  BP 130/88  Pulse 88  Wt 211 lb (95.709 kg)  LMP 09/08/2011    Allergies  Allergen Reactions  . Aspirin     REACTION: lips swell  . Atorvastatin Other (See Comments)    Myalgia     Past Medical History  Diagnosis Date  . Hepatitis C   . Hypertension   . Thyroid disease     Hypothyroid    Past Surgical History  Procedure Laterality Date  . Cholecystectomy    . Tonsillectomy    . Laparoscopic gastric banding      History   Social History  . Marital Status: Married    Spouse Name: N/A    Number of Children: N/A  . Years of Education: N/A   Occupational History  . Not on file.   Social History Main Topics  . Smoking status: Former Research scientist (life sciences)  . Smokeless tobacco: Never Used  . Alcohol Use: Yes     Comment: rare  . Drug Use: No  . Sexual Activity: Yes    Birth Control/ Protection: Other-see comments     Comment: vasectomy   Other Topics Concern  . Not on file   Social History Narrative  . No narrative on file    Family History  Problem Relation Age of Onset  . Diabetes Mother   . Hypertension Mother   . Heart disease Mother   . Diabetes Father   . Hypertension Father     Outpatient Encounter Prescriptions as of 04/12/2013  Medication Sig  . Canagliflozin (INVOKANA) 300 MG TABS Take 1 tablet (300 mg  total) by mouth daily.  Marland Kitchen glipiZIDE (GLUCOTROL) 10 MG tablet Take 1 tablet (10 mg total) by mouth 2 (two) times daily.  Marland Kitchen levothyroxine (SYNTHROID, LEVOTHROID) 150 MCG tablet Take 1 tablet (150 mcg total) by mouth daily.  . metFORMIN (GLUCOPHAGE) 1000 MG tablet Take 1 tablet (1,000 mg total) by mouth 2 (two) times daily with a meal.  . Multiple Vitamin (MULTIVITAMIN) capsule Take 1 capsule by mouth daily.    Marland Kitchen nystatin-triamcinolone ointment (MYCOLOG) Apply 1 application topically 2 (two) times daily.  Marland Kitchen omega-3 acid ethyl esters (LOVAZA) 1 G capsule Take 2 capsules (2 g total) by mouth 2 (two) times daily.  . vitamin E 100 UNIT capsule Take by mouth daily.    . [DISCONTINUED] Canagliflozin (INVOKANA) 300 MG TABS Take 1 tablet (300 mg total) by mouth daily.  . [DISCONTINUED] glipiZIDE (GLUCOTROL) 10 MG tablet Take 1 tablet (10 mg total) by mouth 2 (two) times daily.  . [DISCONTINUED] levothyroxine (SYNTHROID, LEVOTHROID) 150 MCG tablet Take 1 tablet (150 mcg total) by mouth daily.  . [DISCONTINUED] losartan-hydrochlorothiazide (HYZAAR) 100-12.5 MG per tablet Take 1 tablet by mouth daily.  . [DISCONTINUED] losartan-hydrochlorothiazide (HYZAAR) 100-12.5 MG per tablet Take 1 tablet by mouth daily.  . [DISCONTINUED]  metFORMIN (GLUCOPHAGE) 1000 MG tablet Take 1 tablet (1,000 mg total) by mouth 2 (two) times daily with a meal.  . [DISCONTINUED] omega-3 acid ethyl esters (LOVAZA) 1 G capsule Take 2 capsules (2 g total) by mouth 2 (two) times daily.  Marland Kitchen losartan-hydrochlorothiazide (HYZAAR) 100-25 MG per tablet Take 1 tablet by mouth daily.  . [DISCONTINUED] hydrochlorothiazide (HYDRODIURIL) 12.5 MG tablet Take 1 tablet (12.5 mg total) by mouth daily.          Objective:   Physical Exam  Constitutional: She is oriented to person, place, and time. She appears well-developed and well-nourished.  HENT:  Head: Normocephalic and atraumatic.  Neck: Neck supple. No thyromegaly present.  Cardiovascular:  Normal rate, regular rhythm and normal heart sounds.   Pulmonary/Chest: Effort normal and breath sounds normal.  Musculoskeletal: She exhibits no edema.  Lymphadenopathy:    She has no cervical adenopathy.  Neurological: She is alert and oriented to person, place, and time.  Skin: Skin is warm and dry.  Psychiatric: She has a normal mood and affect. Her behavior is normal.          Assessment & Plan:  Diabetes-  A1C is 5.8.  Discussed stopping her invokana. She wants to wean slowly. Will decrase to every other day. Followup in 4 months. Continue work on diet and exercise. Lab Results  Component Value Date   HGBA1C 5.7 12/08/2012   HTN - well controlled.  Continue current regimen.  Hyperlipidemia-due to recheck lipids.  Hepatitis C-we'll recheck liver enzymes.

## 2013-04-20 LAB — COMPLETE METABOLIC PANEL WITH GFR
ALT: 58 U/L — AB (ref 0–35)
AST: 34 U/L (ref 0–37)
Albumin: 4.4 g/dL (ref 3.5–5.2)
Alkaline Phosphatase: 67 U/L (ref 39–117)
BILIRUBIN TOTAL: 1.2 mg/dL (ref 0.2–1.2)
BUN: 13 mg/dL (ref 6–23)
CO2: 26 meq/L (ref 19–32)
CREATININE: 0.79 mg/dL (ref 0.50–1.10)
Calcium: 9.6 mg/dL (ref 8.4–10.5)
Chloride: 103 mEq/L (ref 96–112)
GFR, Est African American: >89 mL/min
GFR, Est Non African American: 85 mL/min
Glucose, Bld: 165 mg/dL — ABNORMAL HIGH (ref 70–99)
Potassium: 3.7 mEq/L (ref 3.5–5.3)
Sodium: 140 mEq/L (ref 135–145)
Total Protein: 7 g/dL (ref 6.0–8.3)

## 2013-04-20 LAB — LIPID PANEL
CHOLESTEROL: 221 mg/dL — AB (ref 0–200)
HDL: 34 mg/dL — ABNORMAL LOW (ref >39)
LDL Cholesterol: 119 mg/dL — ABNORMAL HIGH (ref 0–99)
TRIGLYCERIDES: 341 mg/dL — AB (ref <150)
Total CHOL/HDL Ratio: 6.5 Ratio
VLDL: 68 mg/dL — ABNORMAL HIGH (ref 0–40)

## 2013-04-21 LAB — TSH: TSH: 0.906 u[IU]/mL (ref 0.350–4.500)

## 2013-06-30 IMAGING — CR DG HIP COMPLETE 2+V*R*
3 series · 3 of 3 positions shown · non-contrast
Comparison: None.

CLINICAL DATA: Knee pain, right groin pain

RIGHT HIP - COMPLETE 2+ VIEW

[view not recorded (1 of 3)]
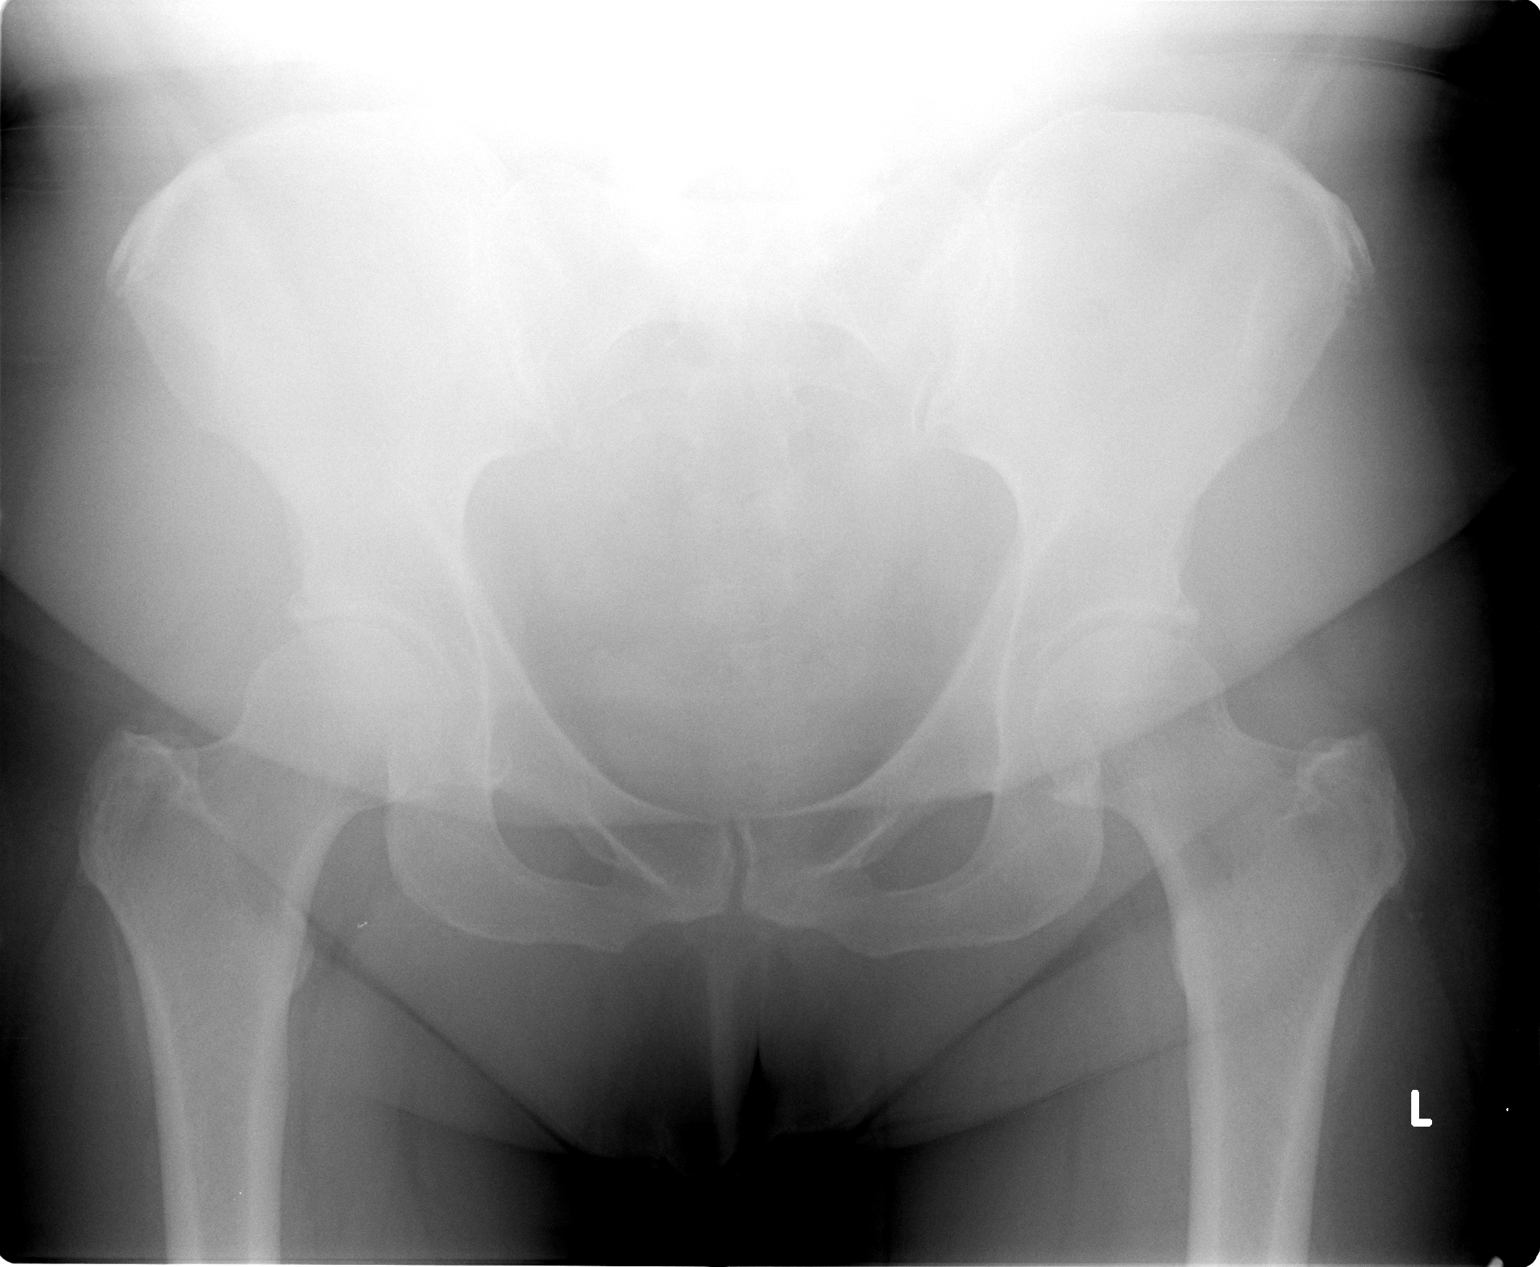

[view not recorded (2 of 3)]
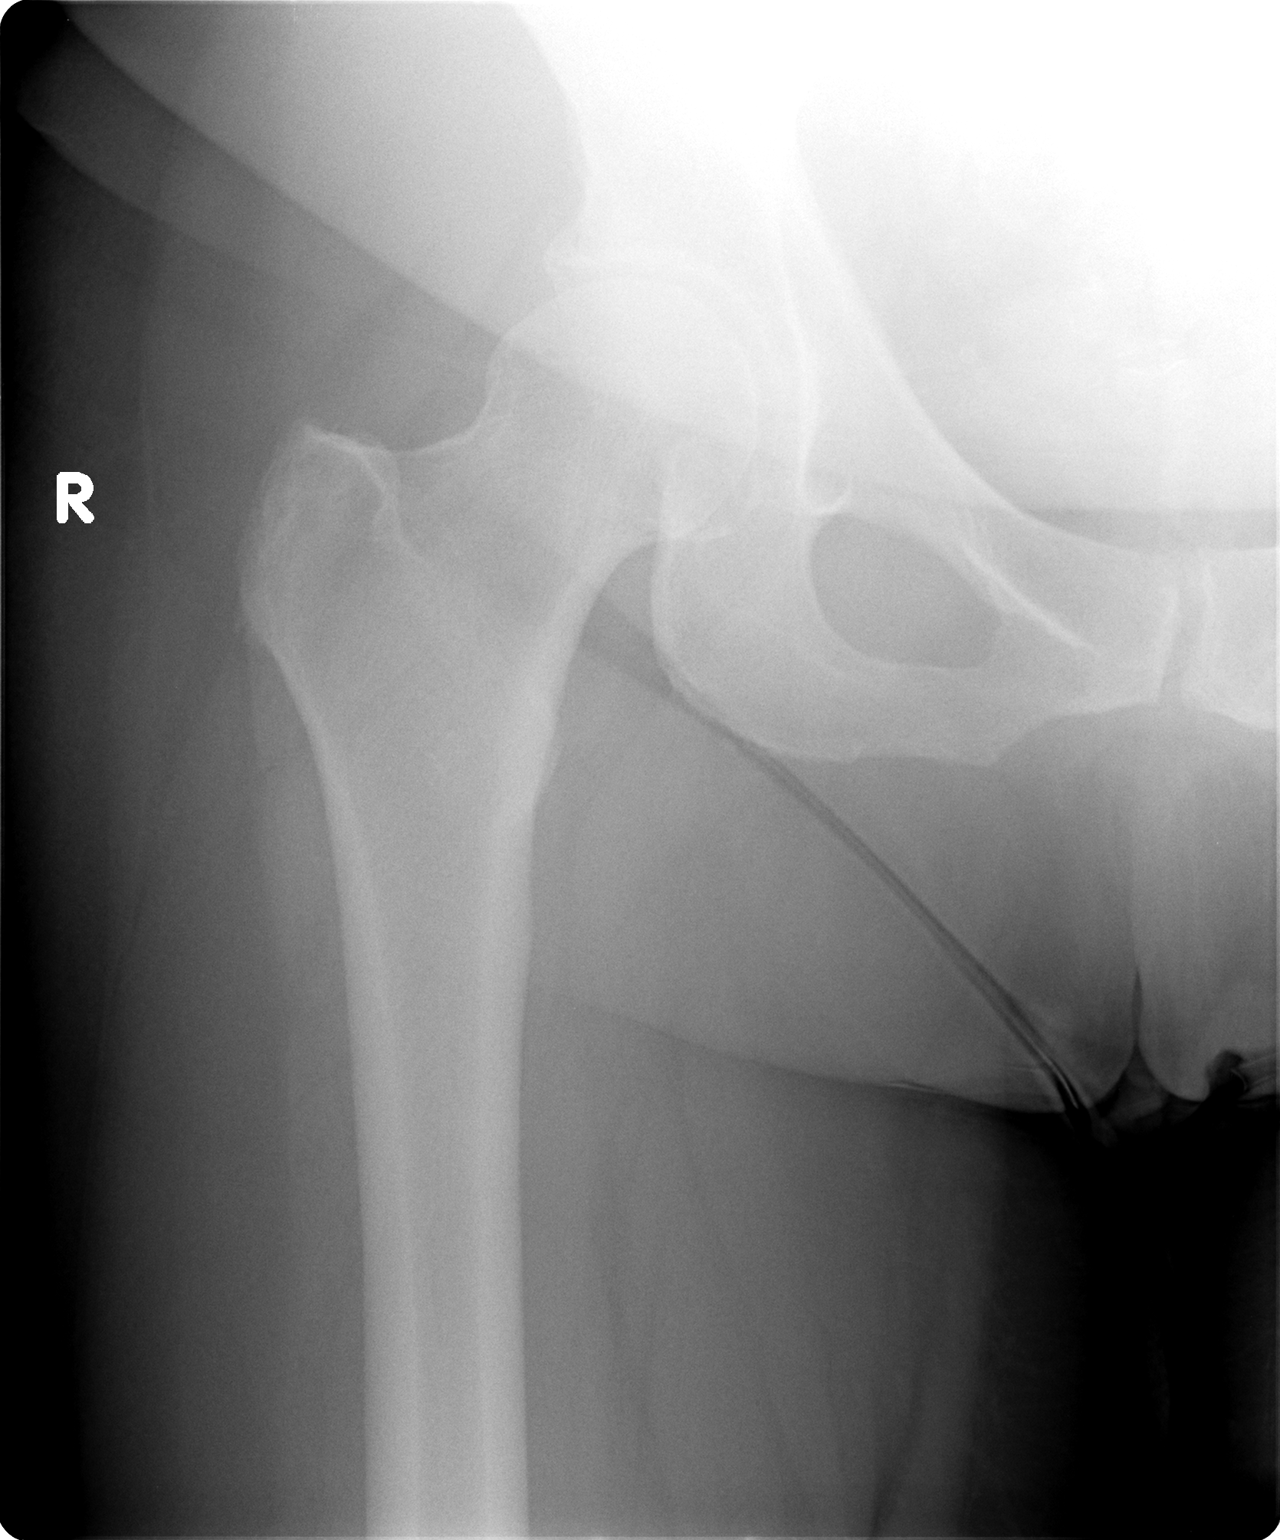

[view not recorded (3 of 3)]
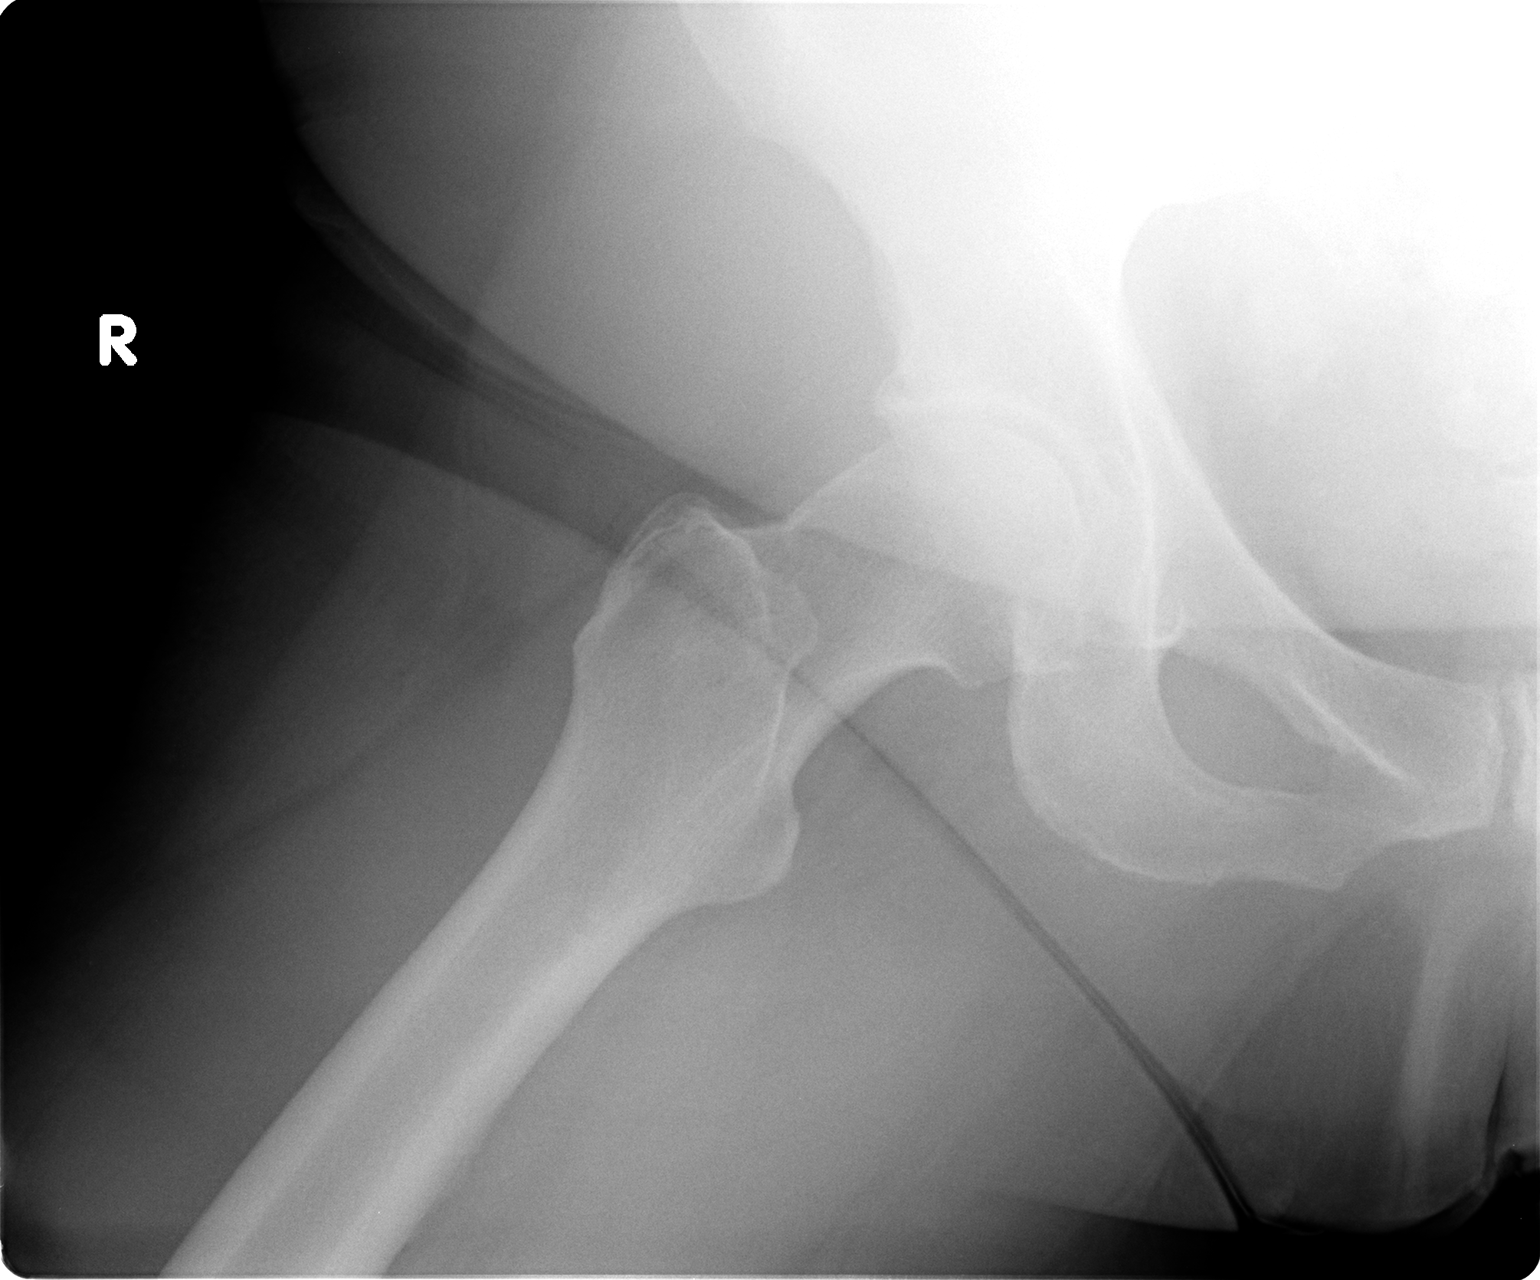

[3 of 3 positions shown; findings below may reference images not displayed]

FINDINGS: Three views of the right hip submitted.  No acute
fracture or subluxation.  Mild degenerative changes with superior
narrowing of joint space.  Mild superior acetabular spurring.
IMPRESSION: No acute fracture or subluxation.  Mild degenerative changes.

## 2013-08-12 ENCOUNTER — Ambulatory Visit: Payer: BC Managed Care – PPO | Admitting: Family Medicine

## 2013-08-13 ENCOUNTER — Ambulatory Visit (INDEPENDENT_AMBULATORY_CARE_PROVIDER_SITE_OTHER): Payer: BC Managed Care – PPO | Admitting: Family Medicine

## 2013-08-13 ENCOUNTER — Encounter: Payer: Self-pay | Admitting: Family Medicine

## 2013-08-13 VITALS — BP 151/86 | HR 79 | Wt 216.0 lb

## 2013-08-13 DIAGNOSIS — IMO0001 Reserved for inherently not codable concepts without codable children: Secondary | ICD-10-CM

## 2013-08-13 DIAGNOSIS — E785 Hyperlipidemia, unspecified: Secondary | ICD-10-CM

## 2013-08-13 DIAGNOSIS — E1165 Type 2 diabetes mellitus with hyperglycemia: Principal | ICD-10-CM

## 2013-08-13 DIAGNOSIS — M2550 Pain in unspecified joint: Secondary | ICD-10-CM

## 2013-08-13 DIAGNOSIS — I10 Essential (primary) hypertension: Secondary | ICD-10-CM

## 2013-08-13 LAB — POCT GLYCOSYLATED HEMOGLOBIN (HGB A1C): Hemoglobin A1C: 6.7

## 2013-08-13 MED ORDER — METFORMIN HCL 1000 MG PO TABS: 1000.0000 mg | ORAL_TABLET | Freq: Two times a day (BID) | ORAL | Status: DC

## 2013-08-13 MED ORDER — PRAVASTATIN SODIUM 40 MG PO TABS: 40.0000 mg | ORAL_TABLET | Freq: Every day | ORAL | Status: DC

## 2013-08-13 MED ORDER — GLIPIZIDE 10 MG PO TABS: 10.0000 mg | ORAL_TABLET | Freq: Two times a day (BID) | ORAL | Status: DC

## 2013-08-13 MED ORDER — OMEGA-3-ACID ETHYL ESTERS 1 G PO CAPS: 2.0000 g | ORAL_CAPSULE | Freq: Two times a day (BID) | ORAL | Status: DC

## 2013-08-13 MED ORDER — LEVOTHYROXINE SODIUM 150 MCG PO TABS: 150.0000 ug | ORAL_TABLET | Freq: Every day | ORAL | Status: DC

## 2013-08-13 MED ORDER — LOSARTAN POTASSIUM-HCTZ 100-25 MG PO TABS: 1.0000 | ORAL_TABLET | Freq: Every day | ORAL | Status: DC

## 2013-08-13 NOTE — Progress Notes (Signed)
Subjective:    Patient ID: Pamela Holloway, female    DOB: 1960/06/29, 53 y.o.   MRN: [id number]  HPI Diabetes - no hypoglycemic events. No wounds or sores that are not healing well. No increased thirst or urination. Checking glucose at home. Taking medications as prescribed without any side effects.  Hypertension- Pt denies chest pain, SOB, dizziness, or heart palpitations.  Taking meds as directed w/o problems.  Denies medication side effects.    Hyperlipidemia-She has tried Lipitor in the past and did not tolerate it well. She is very worried it will aggravate her joint pain.  Complains of diffuse joint pain in [place], elbows. And knees, etc.  No swelling or redness of the joints. No family history rheumatoid arthritis or psoriatic arthritis. She says she does heart hurts all over..   Review of Systems  BP 151/86  Pulse 79  Wt 216 lb (97.977 kg)  LMP 09/08/2011    Allergies  Allergen Reactions  . Aspirin     REACTION: lips swell  . Atorvastatin Other (See Comments)    Myalgia     Past Medical History  Diagnosis Date  . Hepatitis C   . Hypertension   . Thyroid disease     Hypothyroid    Past Surgical History  Procedure Laterality Date  . Cholecystectomy    . Tonsillectomy    . Laparoscopic gastric banding      History   Social History  . Marital Status: Married    Spouse Name: N/A    Number of Children: N/A  . Years of Education: N/A   Occupational History  . Not on file.   Social History Main Topics  . Smoking status: Former Research scientist (life sciences)  . Smokeless tobacco: Never Used  . Alcohol Use: Yes     Comment: rare  . Drug Use: No  . Sexual Activity: Yes    Birth Control/ Protection: Other-see comments     Comment: vasectomy   Other Topics Concern  . Not on file   Social History Narrative  . No narrative on file    Family History  Problem Relation Age of Onset  . Diabetes Mother   . Hypertension Mother   . Heart disease Mother   . Diabetes Father    . Hypertension Father     Outpatient Encounter Prescriptions as of 08/13/2013  Medication Sig  . glipiZIDE (GLUCOTROL) 10 MG tablet Take 1 tablet (10 mg total) by mouth 2 (two) times daily.  Marland Kitchen levothyroxine (SYNTHROID, LEVOTHROID) 150 MCG tablet Take 1 tablet (150 mcg total) by mouth daily.  Marland Kitchen losartan-hydrochlorothiazide (HYZAAR) 100-25 MG per tablet Take 1 tablet by mouth daily.  . metFORMIN (GLUCOPHAGE) 1000 MG tablet Take 1 tablet (1,000 mg total) by mouth 2 (two) times daily with a meal.  . Multiple Vitamin (MULTIVITAMIN) capsule Take 1 capsule by mouth daily.    Marland Kitchen omega-3 acid ethyl esters (LOVAZA) 1 G capsule Take 2 capsules (2 g total) by mouth 2 (two) times daily.  . vitamin E 100 UNIT capsule Take by mouth daily.    . [DISCONTINUED] glipiZIDE (GLUCOTROL) 10 MG tablet Take 1 tablet (10 mg total) by mouth 2 (two) times daily.  . [DISCONTINUED] levothyroxine (SYNTHROID, LEVOTHROID) 150 MCG tablet Take 1 tablet (150 mcg total) by mouth daily.  . [DISCONTINUED] losartan-hydrochlorothiazide (HYZAAR) 100-25 MG per tablet Take 1 tablet by mouth daily.  . [DISCONTINUED] metFORMIN (GLUCOPHAGE) 1000 MG tablet Take 1 tablet (1,000 mg total) by mouth 2 (two) times  daily with a meal.  . [DISCONTINUED] omega-3 acid ethyl esters (LOVAZA) 1 G capsule Take 2 capsules (2 g total) by mouth 2 (two) times daily.  . pravastatin (PRAVACHOL) 40 MG tablet Take 1 tablet (40 mg total) by mouth daily.  . [DISCONTINUED] Canagliflozin (INVOKANA) 300 MG TABS Take 1 tablet (300 mg total) by mouth daily.  . [DISCONTINUED] nystatin-triamcinolone ointment (MYCOLOG) Apply 1 application topically 2 (two) times daily.          Objective:   Physical Exam  Constitutional: She is oriented to person, place, and time. She appears well-developed and well-nourished.  HENT:  Head: Normocephalic and atraumatic.  Neck: Neck supple. No thyromegaly present.  Cardiovascular: Normal rate, regular rhythm and normal heart  sounds.   Pulmonary/Chest: Effort normal and breath sounds normal.  Lymphadenopathy:    She has no cervical adenopathy.  Neurological: She is alert and oriented to person, place, and time.  Skin: Skin is warm and dry.  Psychiatric: She has a normal mood and affect. Her behavior is normal.          Assessment & Plan:  DM - controlled on current regimen. We discussed the importance of being on a statin to get her LDL at goal. Her blood pressure was not at goal today but was normal at her last office visit. Continue work on diet and exercise and recheck at followup. At that point if still elevated then we will need to adjust her regimen. She was under a fair amount of stress this morning and feels like that is why her blood pressures high. Lab Results  Component Value Date   HGBA1C 6.7 08/13/2013    HTN- Uncontrolled today. Continue work on diet and exercise and recheck at followup. At that point if still elevated then we will need to adjust her regimen. She was under a fair amount of stress this morning and feels like that is why her blood pressures high.   Hyperlipidemia - Not on a statin.  We discussed the importance of being on a statin to lower her risk of heart attack and stroke. With her diagnosis of high blood pressure diabetes and she is over 50 she is at higher risk. She says she's willing to try pravastatin. We'll need to recheck her liver and lipids in 6-8 weeks on admission.  Polyarthralgia-discussed with her be happy to edition rheumatologic workup since her joint pain is so diffuse that followup if she would like.

## 2013-08-20 ENCOUNTER — Encounter: Payer: Self-pay | Admitting: Sports Medicine

## 2013-08-20 ENCOUNTER — Ambulatory Visit (INDEPENDENT_AMBULATORY_CARE_PROVIDER_SITE_OTHER): Payer: BC Managed Care – PPO | Admitting: Sports Medicine

## 2013-08-20 ENCOUNTER — Ambulatory Visit (INDEPENDENT_AMBULATORY_CARE_PROVIDER_SITE_OTHER): Payer: BC Managed Care – PPO

## 2013-08-20 VITALS — BP 141/80 | HR 76 | Ht 63.5 in | Wt 216.0 lb

## 2013-08-20 DIAGNOSIS — M5416 Radiculopathy, lumbar region: Secondary | ICD-10-CM

## 2013-08-20 DIAGNOSIS — M25559 Pain in unspecified hip: Secondary | ICD-10-CM

## 2013-08-20 DIAGNOSIS — M1610 Unilateral primary osteoarthritis, unspecified hip: Secondary | ICD-10-CM

## 2013-08-20 DIAGNOSIS — M1611 Unilateral primary osteoarthritis, right hip: Secondary | ICD-10-CM

## 2013-08-20 DIAGNOSIS — IMO0002 Reserved for concepts with insufficient information to code with codable children: Secondary | ICD-10-CM | POA: Diagnosis not present

## 2013-08-20 DIAGNOSIS — M549 Dorsalgia, unspecified: Secondary | ICD-10-CM

## 2013-08-20 MED ORDER — PREDNISONE 50 MG PO TABS: ORAL_TABLET | ORAL | Status: DC

## 2013-08-20 NOTE — Progress Notes (Signed)
   Subjective:    I'm seeing this patient as a consultation for:  Dr. Madilyn Fireman  CC: Hip and leg pain  HPI: Right hip pain: Moderate, persistent, localized in the groin, worse with weight bearing and flexion of the hip, radiates around the groin, worse in the mornings with gelling.  Right leg pain: Worse with extension of the knee and flexion of the hip, worse driving a car, with flexion of the spine, and with Valsalva. Moderate, persistent.  Past medical history, Surgical history, Family history not pertinant except as noted below, Social history, Allergies, and medications have been entered into the medical record, reviewed, and no changes needed.   Review of Systems: No headache, visual changes, nausea, vomiting, diarrhea, constipation, dizziness, abdominal pain, skin rash, fevers, chills, night sweats, weight loss, swollen lymph nodes, body aches, joint swelling, muscle aches, chest pain, shortness of breath, mood changes, visual or auditory hallucinations.   Objective:   General: Well Developed, well nourished, and in no acute distress.  Neuro/Psych: Alert and oriented x3, extra-ocular muscles intact, able to move all 4 extremities, sensation grossly intact. Skin: Warm and dry, no rashes noted.  Respiratory: Not using accessory muscles, speaking in full sentences, trachea midline.  Cardiovascular: Pulses palpable, no extremity edema. Abdomen: Does not appear distended. Right Hip: ROM IR: 60 Deg, ER: 60 Deg, Flexion: 120 Deg, Extension: 100 Deg, Abduction: 45 Deg, Adduction: 45 Deg, reproduction of pain with internal rotation. Strength IR: 5/5, ER: 5/5, Flexion: 5/5, Extension: 5/5, Abduction: 5/5, Adduction: 5/5 Pelvic alignment unremarkable to inspection and palpation. Standing hip rotation and gait without trendelenburg / unsteadiness. Greater trochanter without tenderness to palpation. No tenderness over piriformis. No SI joint tenderness and normal minimal SI  movement.  Procedure: Real-time Ultrasound Guided Injection of right hip joint Device: GE Logiq E  Verbal informed consent obtained.  Time-out conducted.  Noted no overlying erythema, induration, or other signs of local infection.  Skin prepped in a sterile fashion.  Local anesthesia: Topical Ethyl chloride.  With sterile technique and under real time ultrasound guidance:  Spinal needle advanced to the femoral head/neck junction, 2 cc Kenalog 40, 4 cc lidocaine injected easily. Completed without difficulty  Pain immediately resolved suggesting accurate placement of the medication.  Advised to call if fevers/chills, erythema, induration, drainage, or persistent bleeding.  Images permanently stored and available for review in the ultrasound unit.  Impression: Technically successful ultrasound guided injection.  Impression and Recommendations:   This case required medical decision making of moderate complexity.

## 2013-08-20 NOTE — Assessment & Plan Note (Signed)
Failure of conservative measures with NSAIDs. Femoral acetabular joint injection. Physical therapy. Return in one month.

## 2013-08-20 NOTE — Assessment & Plan Note (Signed)
Right L5 radiculopathy. X-rays. Formal physical therapy, prednisone. Return to see me in a month, MRI if no better.

## 2013-09-09 ENCOUNTER — Ambulatory Visit (INDEPENDENT_AMBULATORY_CARE_PROVIDER_SITE_OTHER): Payer: BC Managed Care – PPO | Admitting: Women's Health

## 2013-09-09 ENCOUNTER — Encounter: Payer: Self-pay | Admitting: Women's Health

## 2013-09-09 DIAGNOSIS — N938 Other specified abnormal uterine and vaginal bleeding: Secondary | ICD-10-CM

## 2013-09-09 DIAGNOSIS — N925 Other specified irregular menstruation: Secondary | ICD-10-CM

## 2013-09-09 DIAGNOSIS — N949 Unspecified condition associated with female genital organs and menstrual cycle: Secondary | ICD-10-CM

## 2013-09-09 MED ORDER — MEGESTROL ACETATE 40 MG PO TABS: 40.0000 mg | ORAL_TABLET | Freq: Two times a day (BID) | ORAL | Status: DC

## 2013-09-09 MED ORDER — MEDROXYPROGESTERONE ACETATE 10 MG PO TABS: 10.0000 mg | ORAL_TABLET | Freq: Every day | ORAL | Status: DC

## 2013-09-09 NOTE — Patient Instructions (Addendum)
Dysfunctional Uterine Bleeding Normally, menstrual periods begin between ages 11 to 17 in Pamela Holloway women. A normal menstrual cycle/period may begin every 23 days up to 35 days and lasts from 1 to 7 days. Around 12 to 14 days before your menstrual period starts, ovulation (ovary produces an egg) occurs. When counting the time between menstrual periods, count from the first day of bleeding of the previous period to the first day of bleeding of the next period. Dysfunctional (abnormal) uterine bleeding is bleeding that is different from a normal menstrual period. Your periods may come earlier or later than usual. They may be lighter, have blood clots or be heavier. You may have bleeding between periods, or you may skip one period or more. You may have bleeding after sexual intercourse, bleeding after menopause, or no menstrual period. CAUSES   Pregnancy (normal, miscarriage, tubal).  IUDs (intrauterine device, birth control).  Birth control pills.  Hormone treatment.  Menopause.  Infection of the cervix.  Blood clotting problems.  Infection of the inside lining of the uterus.  Endometriosis, inside lining of the uterus growing in the pelvis and other female organs.  Adhesions (scar tissue) inside the uterus.  Obesity or severe weight loss.  Uterine polyps inside the uterus.  Cancer of the vagina, cervix, or uterus.  Ovarian cysts or polycystic ovary syndrome.  Medical problems (diabetes, thyroid disease).  Uterine fibroids (noncancerous tumor).  Problems with your female hormones.  Endometrial hyperplasia, very thick lining and enlarged cells inside of the uterus.  Medicines that interfere with ovulation.  Radiation to the pelvis or abdomen.  Chemotherapy. DIAGNOSIS   Your doctor will discuss the history of your menstrual periods, medicines you are taking, changes in your weight, stress in your life, and any medical problems you may have.  Your doctor will do a physical  and pelvic examination.  Your doctor may want to perform certain tests to make a diagnosis, such as:  Pap test.  Blood tests.  Cultures for infection.  CT scan.  Ultrasound.  Hysteroscopy.  Laparoscopy.  MRI.  Hysterosalpingography.  D and C.  Endometrial biopsy. TREATMENT  Treatment will depend on the cause of the dysfunctional uterine bleeding (DUB). Treatment may include:  Observing your menstrual periods for a couple of months.  Prescribing medicines for medical problems, including:  Antibiotics.  Hormones.  Birth control pills.  Removing an IUD (intrauterine device, birth control).  Surgery:  D and C (scrape and remove tissue from inside the uterus).  Laparoscopy (examine inside the abdomen with a lighted tube).  Uterine ablation (destroy lining of the uterus with electrical current, laser, heat, or freezing).  Hysteroscopy (examine cervix and uterus with a lighted tube).  Hysterectomy (remove the uterus). HOME CARE INSTRUCTIONS   If medicines were prescribed, take exactly as directed. Do not change or switch medicines without consulting your caregiver.  Long term heavy bleeding may result in iron deficiency. Your caregiver may have prescribed iron pills. They help replace the iron that your body lost from heavy bleeding. Take exactly as directed.  Do not take aspirin or medicines that contain aspirin one week before or during your menstrual period. Aspirin may make the bleeding worse.  If you need to change your sanitary pad or tampon more than once every 2 hours, stay in bed with your feet elevated and a cold pack on your lower abdomen. Rest as much as possible, until the bleeding stops or slows down.  Eat well-balanced meals. Eat foods high in iron. Examples   are:  Leafy green vegetables.  Whole-grain breads and cereals.  Eggs.  Meat.  Liver.  Do not try to lose weight until the abnormal bleeding has stopped and your blood iron level is  back to normal. Do not lift more than ten pounds or do strenuous activities when you are bleeding.  For a couple of months, make note on your calendar, marking the start and ending of your period, and the type of bleeding (light, medium, heavy, spotting, clots or missed periods). This is for your caregiver to better evaluate your problem. SEEK MEDICAL CARE IF:   You develop nausea (feeling sick to your stomach) and vomiting, dizziness, or diarrhea while you are taking your medicine.  You are getting lightheaded or weak.  You have any problems that may be related to the medicine you are taking.  You develop pain with your DUB.  You want to remove your IUD.  You want to stop or change your birth control pills or hormones.  You have any type of abnormal bleeding mentioned above.  You are over 54 years old and have not had a menstrual period yet.  You are 53 years old and you are still having menstrual periods.  You have any of the symptoms mentioned above.  You develop a rash. SEEK IMMEDIATE MEDICAL CARE IF:   An oral temperature above 102 F (38.9 C) develops.  You develop chills.  You are changing your sanitary pad or tampon more than once an hour.  You develop abdominal pain.  You pass out or faint. Document Released: 01/12/2000 Document Revised: 04/08/2011 Document Reviewed: 12/13/2008 [facility] Patient Information 2015 [place], [place]. This information is not intended to replace advice given to you by your health care provider. Make sure you discuss any questions you have with your health care provider. Postmenopausal Bleeding Postmenopausal bleeding is any bleeding a woman has after she has entered into menopause. Menopause is the end of a woman's fertile years. After menopause, a woman no longer ovulates or has menstrual periods.  Postmenopausal bleeding can be caused by various things. Any type of postmenopausal bleeding, even if it appears to be a typical menstrual  period, is concerning. This should be evaluated by your health care provider. Any treatment will depend on the cause of the bleeding. HOME CARE INSTRUCTIONS Monitor your condition for any changes. The following actions may help to alleviate any discomfort you are experiencing:  Avoid the use of tampons and douches as directed by your health care provider.  Change your pads frequently.  Get regular pelvic exams and Pap tests.  Keep all follow-up appointments for diagnostic tests as directed by your health care provider. SEEK MEDICAL CARE IF:   Your bleeding lasts more than 1 week.  You have abdominal pain.  You have bleeding with sexual intercourse. SEEK IMMEDIATE MEDICAL CARE IF:   You have a fever, chills, headache, dizziness, muscle aches, and bleeding.  You have severe pain with bleeding.  You are passing blood clots.  You have bleeding and need more than 1 pad an hour.  You feel faint. MAKE SURE YOU:  Understand these instructions.  Will watch your condition.  Will get help right away if you are not doing well or get worse. Document Released: 04/24/2005 Document Revised: 11/04/2012 Document Reviewed: 08/13/2012 [facility] Patient Information 2015 [place], [place]. This information is not intended to replace advice given to you by your health care provider. Make sure you discuss any questions you have with your health care provider.

## 2013-09-09 NOTE — Progress Notes (Signed)
Patient ID: Pamela Holloway, female   DOB: 1960/03/04, 53 y.o.   MRN: [id number] Presents with complaint of postmenopausal bleeding. Amenorrheic greater than 2 years. No HRT. Started with brown spotting and now flowing like regular menses, used 6- 7 pads yesterday. Denies vaginal discharge, urinary symptoms, abdominal pain. Reports mother had problems with postmenopausal bleeding. Benign endocervical polyp noted coincidentally  02/2013 annual exam with Dr Phineas Real, no bleeding prior of after removal.  Exam: Appears well. External genitalia no visible erythema, speculum exam moderate amount of menses type blood with no odor or erythema noted. Cervix without lesion, polyp. Bimanual no CMT or adnexal fullness or tenderness somewhat limited large abdominal girth.  Postmenopausal bleeding  Plan: Megace 40 mg twice daily, sonohysterogram with biopsy per Dr. Phineas Real, will schedule. Instructed to call if bleeding does not stop.

## 2013-09-10 ENCOUNTER — Other Ambulatory Visit: Payer: Self-pay | Admitting: Family Medicine

## 2013-09-10 ENCOUNTER — Telehealth: Payer: Self-pay | Admitting: *Deleted

## 2013-09-10 ENCOUNTER — Telehealth: Payer: Self-pay | Admitting: Family Medicine

## 2013-09-10 ENCOUNTER — Ambulatory Visit (INDEPENDENT_AMBULATORY_CARE_PROVIDER_SITE_OTHER): Payer: BC Managed Care – PPO

## 2013-09-10 ENCOUNTER — Ambulatory Visit (INDEPENDENT_AMBULATORY_CARE_PROVIDER_SITE_OTHER): Payer: BC Managed Care – PPO | Admitting: Sports Medicine

## 2013-09-10 ENCOUNTER — Encounter: Payer: Self-pay | Admitting: Sports Medicine

## 2013-09-10 VITALS — BP 129/77 | HR 93 | Ht 64.0 in | Wt 213.0 lb

## 2013-09-10 DIAGNOSIS — G43009 Migraine without aura, not intractable, without status migrainosus: Secondary | ICD-10-CM

## 2013-09-10 DIAGNOSIS — E785 Hyperlipidemia, unspecified: Secondary | ICD-10-CM

## 2013-09-10 DIAGNOSIS — M5380 Other specified dorsopathies, site unspecified: Secondary | ICD-10-CM

## 2013-09-10 DIAGNOSIS — IMO0002 Reserved for concepts with insufficient information to code with codable children: Secondary | ICD-10-CM | POA: Diagnosis not present

## 2013-09-10 DIAGNOSIS — E039 Hypothyroidism, unspecified: Secondary | ICD-10-CM

## 2013-09-10 DIAGNOSIS — M5030 Other cervical disc degeneration, unspecified cervical region: Secondary | ICD-10-CM

## 2013-09-10 DIAGNOSIS — M5416 Radiculopathy, lumbar region: Secondary | ICD-10-CM

## 2013-09-10 DIAGNOSIS — Z9884 Bariatric surgery status: Secondary | ICD-10-CM | POA: Diagnosis not present

## 2013-09-10 DIAGNOSIS — M549 Dorsalgia, unspecified: Secondary | ICD-10-CM

## 2013-09-10 DIAGNOSIS — B1710 Acute hepatitis C without hepatic coma: Secondary | ICD-10-CM

## 2013-09-10 DIAGNOSIS — M1610 Unilateral primary osteoarthritis, unspecified hip: Secondary | ICD-10-CM

## 2013-09-10 DIAGNOSIS — I10 Essential (primary) hypertension: Secondary | ICD-10-CM

## 2013-09-10 DIAGNOSIS — M5134 Other intervertebral disc degeneration, thoracic region: Secondary | ICD-10-CM | POA: Insufficient documentation

## 2013-09-10 DIAGNOSIS — M1611 Unilateral primary osteoarthritis, right hip: Secondary | ICD-10-CM

## 2013-09-10 MED ORDER — AMITRIPTYLINE HCL 50 MG PO TABS: ORAL_TABLET | ORAL | Status: DC

## 2013-09-10 NOTE — Telephone Encounter (Signed)
Pamela Holloway left a vm.. Mail Order not been called in.

## 2013-09-10 NOTE — Telephone Encounter (Signed)
Pt told that rx was sent this morning.Pamela Holloway [place]

## 2013-09-10 NOTE — Telephone Encounter (Signed)
I called for prior auth of MRI cervical, thoracic and lumbar. It went to nurse to nurse review and now required peer to peer review due to concurrence of entire spine MRI. Please call [phone] and her [place] [phone]. Thank you, Margette Fast, CMA

## 2013-09-10 NOTE — Assessment & Plan Note (Signed)
With right mid cervical radicular symptoms. Amitriptyline. C-spine x-rays and MRI. Return to go over MRI results.

## 2013-09-10 NOTE — Assessment & Plan Note (Signed)
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 Return to see me to go over results of the MRI.

## 2013-09-10 NOTE — Assessment & Plan Note (Signed)
Persistent right L5 radicular symptoms however greatly improved, at this point we are going to obtain an MRI. This will be for interventional injection planning.

## 2013-09-10 NOTE — Progress Notes (Signed)
  Subjective:    CC: Followup  HPI: Right hip osteoarthritis: Resolved after injection.  Right lumbar radiculitis: Persistent, despite physical therapy, NSAIDs, muscle relaxers, steroids.  Thoracic back pain: Persistent despite physical therapy, steroids, NSAIDs, muscle relaxants.  Cervical radiculitis: Right-sided and persistent, in the distribution of the right mid cervical spine despite physical therapy, steroids, NSAIDs, muscle relaxers.  Past medical history, Surgical history, Family history not pertinant except as noted below, Social history, Allergies, and medications have been entered into the medical record, reviewed, and no changes needed.   Review of Systems: No fevers, chills, night sweats, weight loss, chest pain, or shortness of breath.   Objective:    General: Well Developed, well nourished, and in no acute distress.  Neuro: Alert and oriented x3, extra-ocular muscles intact, sensation grossly intact.  HEENT: Normocephalic, atraumatic, pupils equal round reactive to light, neck supple, no masses, no lymphadenopathy, thyroid nonpalpable.  Skin: Warm and dry, no rashes. Cardiac: Regular rate and rhythm, no murmurs rubs or gallops, no lower extremity edema.  Respiratory: Clear to auscultation bilaterally. Not using accessory muscles, speaking in full sentences.  Impression and Recommendations:

## 2013-09-10 NOTE — Assessment & Plan Note (Signed)
Completely resolved with intra-articular injection.

## 2013-09-13 ENCOUNTER — Telehealth: Payer: Self-pay

## 2013-09-13 NOTE — Telephone Encounter (Signed)
She is scheduled for [facility] with bx with Dr Phineas Real. She is post menopausal (2 years) with bleeding.  Needs to have SHGM with no bleeding, when is it sched.? I am glad she stopped

## 2013-09-13 NOTE — Telephone Encounter (Signed)
Patient informed. 

## 2013-09-13 NOTE — Telephone Encounter (Signed)
SHGM scheduled 10/01/13.  She asked if she should stop Megace since bleeding stopped?

## 2013-09-13 NOTE — Telephone Encounter (Signed)
Patient was in 09/10/13 with a "bleeding issue" and you prescribed Megace. She has stopped bleeding and wondered if she should d/c the Megace now?

## 2013-09-13 NOTE — Telephone Encounter (Signed)
Ok to stop BUT call and will need to start back on megace if bleeding starts again

## 2013-09-15 ENCOUNTER — Other Ambulatory Visit: Payer: Self-pay | Admitting: Sports Medicine

## 2013-09-16 ENCOUNTER — Other Ambulatory Visit: Payer: Self-pay | Admitting: Gynecology

## 2013-09-16 ENCOUNTER — Other Ambulatory Visit: Payer: Self-pay | Admitting: Women's Health

## 2013-09-16 DIAGNOSIS — N950 Postmenopausal bleeding: Secondary | ICD-10-CM

## 2013-09-16 DIAGNOSIS — N938 Other specified abnormal uterine and vaginal bleeding: Secondary | ICD-10-CM

## 2013-09-27 NOTE — Telephone Encounter (Signed)
Called [employer] [facility] [employer] and [employer], still awaiting Peer to peer appearl, on C.T.L MRIs.

## 2013-09-27 NOTE — Telephone Encounter (Signed)
I rec'd decision and it was denied once again. Margette Fast, CMA

## 2013-10-01 ENCOUNTER — Encounter: Payer: Self-pay | Admitting: Gynecology

## 2013-10-01 ENCOUNTER — Ambulatory Visit (INDEPENDENT_AMBULATORY_CARE_PROVIDER_SITE_OTHER): Payer: BC Managed Care – PPO | Admitting: Gynecology

## 2013-10-01 ENCOUNTER — Ambulatory Visit (INDEPENDENT_AMBULATORY_CARE_PROVIDER_SITE_OTHER): Payer: BC Managed Care – PPO

## 2013-10-01 ENCOUNTER — Other Ambulatory Visit: Payer: Self-pay | Admitting: Gynecology

## 2013-10-01 DIAGNOSIS — N83339 Acquired atrophy of ovary and fallopian tube, unspecified side: Secondary | ICD-10-CM

## 2013-10-01 DIAGNOSIS — N950 Postmenopausal bleeding: Secondary | ICD-10-CM

## 2013-10-01 DIAGNOSIS — N938 Other specified abnormal uterine and vaginal bleeding: Secondary | ICD-10-CM

## 2013-10-01 NOTE — Patient Instructions (Signed)
Office will call you with biopsy results. Will plan to keep a menstrual calendar and as long as no further bleeding will monitor. If you do any further bleeding then call the office.

## 2013-10-01 NOTE — Progress Notes (Signed)
Pamela Holloway [birth year] [id number]        53 y.o.  [id number] presents for sonohysterogram secondary to episode of heavy postmenopausal bleeding x1 episode after 2 years of amenorrhea.  Past medical history,surgical history, problem list, medications, allergies, family history and social history were all reviewed and documented in the EPIC chart.  Directed ROS with pertinent positives and negatives documented in the history of present illness/assessment and plan.  Exam: [name] assistant General appearance:  Normal Abdomen soft nontender without gross masses Pelvic external BUS vagina normal. Cervix normal. Uterus grossly normal midline mobile. Adnexa without gross masses  Ultrasound shows uterus normal size and echotexture. Endometrial echo 5.8 mm. Right and left ovaries visualized and normal. Cul-de-sac negative.  Sonohysterogram performed, sterile technique, easy catheter and reduction, good distention with no abnormalities. Endometrial sample taken. Patient tolerated well.  Assessment/Plan:  53 y.o. [id number] with single episode of postmenopausal bleeding. Started on Megace and this resolved. No longer bleeding. Sonohysterogram is negative. Patient will followup for biopsy. Assuming negative in plan expectant management with report of any further bleeding. Stop Megace now. Followup in January/February when she is due for her annual exam.   Note: This document was prepared with digital dictation and possible smart phrase technology. Any transcriptional errors that result from this process are unintentional.   [name], 3:47 PM 10/01/2013

## 2013-11-15 ENCOUNTER — Ambulatory Visit: Payer: BC Managed Care – PPO | Admitting: Family Medicine

## 2013-11-24 ENCOUNTER — Ambulatory Visit (INDEPENDENT_AMBULATORY_CARE_PROVIDER_SITE_OTHER): Payer: BC Managed Care – PPO | Admitting: Family Medicine

## 2013-11-24 ENCOUNTER — Encounter: Payer: Self-pay | Admitting: Family Medicine

## 2013-11-24 VITALS — BP 145/85 | HR 80 | Ht 64.0 in | Wt 217.0 lb

## 2013-11-24 DIAGNOSIS — E1165 Type 2 diabetes mellitus with hyperglycemia: Secondary | ICD-10-CM

## 2013-11-24 DIAGNOSIS — Z8619 Personal history of other infectious and parasitic diseases: Secondary | ICD-10-CM | POA: Diagnosis not present

## 2013-11-24 DIAGNOSIS — IMO0001 Reserved for inherently not codable concepts without codable children: Secondary | ICD-10-CM

## 2013-11-24 DIAGNOSIS — E119 Type 2 diabetes mellitus without complications: Secondary | ICD-10-CM | POA: Diagnosis not present

## 2013-11-24 DIAGNOSIS — I10 Essential (primary) hypertension: Secondary | ICD-10-CM | POA: Diagnosis not present

## 2013-11-24 LAB — POCT GLYCOSYLATED HEMOGLOBIN (HGB A1C): Hemoglobin A1C: 7.5

## 2013-11-24 MED ORDER — DAPAGLIFLOZIN PROPANEDIOL 5 MG PO TABS: 5.0000 mg | ORAL_TABLET | Freq: Every day | ORAL | Status: DC

## 2013-11-24 MED ORDER — PRAVASTATIN SODIUM 40 MG PO TABS: 40.0000 mg | ORAL_TABLET | Freq: Every day | ORAL | Status: DC

## 2013-11-24 NOTE — Assessment & Plan Note (Signed)
Recheck antibodies levels.

## 2013-11-24 NOTE — Assessment & Plan Note (Signed)
Uncontrolled. Up from previous. Will add Farxiga to her regimen.  She had recurrent yeast infection on the invokana.  F/U in 3 months. 'wrok on diet and exercise. Eye exam is UTD. Declined pneumonia vaccine agin. Asked her to think aobut it.

## 2013-11-24 NOTE — Assessment & Plan Note (Signed)
Elevated today but usually well controlled. She has gianed 4 lbs. Work on diet and exercise. Recheck at f/u appt.

## 2013-11-24 NOTE — Progress Notes (Signed)
   Subjective:    Patient ID: Pamela Holloway, female    DOB: [birth year], 53 y.o.   MRN: [id number]  Diabetes   Diabetes - no hypoglycemic events. No wounds or sores that are not healing well. No increased thirst or urination. Checking glucose at home. Taking medications as prescribed without any side effects.  She has gain 4 lbs since last here.   Hypertension- Pt denies chest pain, SOB, dizziness, or heart palpitations.  Taking meds as directed w/o problems.  Denies medication side effects.    Hep C- She is stable. Last levels are nonexistant. She request we check her yearly labs since having a hard time getting back in with Dr. Dorrene German.    Review of Systems     Objective:   Physical Exam  Constitutional: She is oriented to person, place, and time. She appears well-developed and well-nourished.  HENT:  Head: Normocephalic and atraumatic.  Cardiovascular: Normal rate, regular rhythm and normal heart sounds.   Pulmonary/Chest: Effort normal and breath sounds normal.  Neurological: She is alert and oriented to person, place, and time.  Skin: Skin is warm and dry.  Psychiatric: She has a normal mood and affect. Her behavior is normal.          Assessment & Plan:

## 2013-11-25 ENCOUNTER — Telehealth: Payer: Self-pay | Admitting: *Deleted

## 2013-11-25 DIAGNOSIS — Z8619 Personal history of other infectious and parasitic diseases: Secondary | ICD-10-CM

## 2013-11-25 DIAGNOSIS — E119 Type 2 diabetes mellitus without complications: Secondary | ICD-10-CM

## 2013-11-25 LAB — BASIC METABOLIC PANEL WITH GFR
BUN: 10 mg/dL (ref 6–23)
CHLORIDE: 102 meq/L (ref 96–112)
CO2: 24 mEq/L (ref 19–32)
Calcium: 10 mg/dL (ref 8.4–10.5)
Creat: 0.86 mg/dL (ref 0.50–1.10)
GFR, EST NON AFRICAN AMERICAN: 77 mL/min
GFR, Est African American: 89 mL/min
Glucose, Bld: 107 mg/dL — ABNORMAL HIGH (ref 70–99)
POTASSIUM: 3.6 meq/L (ref 3.5–5.3)
Sodium: 140 mEq/L (ref 135–145)

## 2013-11-25 LAB — HEPATIC FUNCTION PANEL
ALK PHOS: 85 U/L (ref 39–117)
ALT: 67 U/L — ABNORMAL HIGH (ref 0–35)
AST: 47 U/L — ABNORMAL HIGH (ref 0–37)
Albumin: 4.7 g/dL (ref 3.5–5.2)
BILIRUBIN INDIRECT: 1.1 mg/dL (ref 0.2–1.2)
Bilirubin, Direct: 0.2 mg/dL (ref 0.0–0.3)
TOTAL PROTEIN: 7.6 g/dL (ref 6.0–8.3)
Total Bilirubin: 1.3 mg/dL — ABNORMAL HIGH (ref 0.2–1.2)

## 2013-11-25 LAB — HEPATITIS C ANTIBODY: HCV Ab: REACTIVE — AB

## 2013-11-25 NOTE — Telephone Encounter (Signed)
Labs ordered and pt notified to get labs done at her convenience

## 2013-11-29 ENCOUNTER — Encounter: Payer: Self-pay | Admitting: Family Medicine

## 2013-11-29 LAB — HEPATITIS C RNA QUANTITATIVE: HCV Quantitative: NOT DETECTED IU/mL (ref <15)

## 2013-12-06 ENCOUNTER — Telehealth: Payer: Self-pay | Admitting: *Deleted

## 2013-12-06 ENCOUNTER — Ambulatory Visit (INDEPENDENT_AMBULATORY_CARE_PROVIDER_SITE_OTHER): Payer: BC Managed Care – PPO | Admitting: Family Medicine

## 2013-12-06 ENCOUNTER — Encounter: Payer: Self-pay | Admitting: Family Medicine

## 2013-12-06 VITALS — BP 144/80 | HR 105 | Temp 98.1°F | Wt 214.0 lb

## 2013-12-06 DIAGNOSIS — J069 Acute upper respiratory infection, unspecified: Secondary | ICD-10-CM

## 2013-12-06 MED ORDER — AMOXICILLIN-POT CLAVULANATE 875-125 MG PO TABS: 1.0000 | ORAL_TABLET | Freq: Two times a day (BID) | ORAL | Status: DC

## 2013-12-06 NOTE — Telephone Encounter (Signed)
Pt called and reports that her husband was seen at [facility] on Friday and was given ABX she stated that she tried to come in today to be seen but there are no openings with anyone and she couldn't afford the co pay she wanted to know if Dr. Madilyn Fireman would call in something for her. She has been coughing up yellow mucus for 2 days, no fever, she experienced chills 1 day she tried some OTC med not working. Please advise.Pamela Holloway [place]

## 2013-12-06 NOTE — Telephone Encounter (Signed)
See if can come in today at 3:15

## 2013-12-06 NOTE — Patient Instructions (Signed)
Upper Respiratory Infection, Adult An upper respiratory infection (URI) is also sometimes known as the common cold. The upper respiratory tract includes the nose, sinuses, throat, trachea, and bronchi. Bronchi are the airways leading to the lungs. Most people improve within 1 week, but symptoms can last up to 2 weeks. A residual cough may last even longer.  CAUSES Many different viruses can infect the tissues lining the upper respiratory tract. The tissues become irritated and inflamed and often become very moist. Mucus production is also common. A cold is contagious. You can easily spread the virus to others by oral contact. This includes kissing, sharing a glass, coughing, or sneezing. Touching your mouth or nose and then touching a surface, which is then touched by another person, can also spread the virus. SYMPTOMS  Symptoms typically develop 1 to 3 days after you come in contact with a cold virus. Symptoms vary from person to person. They may include:  Runny nose.  Sneezing.  Nasal congestion.  Sinus irritation.  Sore throat.  Loss of voice (laryngitis).  Cough.  Fatigue.  Muscle aches.  Loss of appetite.  Headache.  Low-grade fever. DIAGNOSIS  You might diagnose your own cold based on familiar symptoms, since most people get a cold 2 to 3 times a year. Your caregiver can confirm this based on your exam. Most importantly, your caregiver can check that your symptoms are not due to another disease such as strep throat, sinusitis, pneumonia, asthma, or epiglottitis. Blood tests, throat tests, and X-rays are not necessary to diagnose a common cold, but they may sometimes be helpful in excluding other more serious diseases. Your caregiver will decide if any further tests are required. RISKS AND COMPLICATIONS  You may be at risk for a more severe case of the common cold if you smoke cigarettes, have chronic heart disease (such as heart failure) or lung disease (such as asthma), or if  you have a weakened immune system. The very young and very old are also at risk for more serious infections. Bacterial sinusitis, middle ear infections, and bacterial pneumonia can complicate the common cold. The common cold can worsen asthma and chronic obstructive pulmonary disease (COPD). Sometimes, these complications can require emergency medical care and may be life-threatening. PREVENTION  The best way to protect against getting a cold is to practice good hygiene. Avoid oral or hand contact with people with cold symptoms. Wash your hands often if contact occurs. There is no clear evidence that vitamin C, vitamin E, echinacea, or exercise reduces the chance of developing a cold. However, it is always recommended to get plenty of rest and practice good nutrition. TREATMENT  Treatment is directed at relieving symptoms. There is no cure. Antibiotics are not effective, because the infection is caused by a virus, not by bacteria. Treatment may include:  Increased fluid intake. Sports drinks offer valuable electrolytes, sugars, and fluids.  Breathing heated mist or steam (vaporizer or shower).  Eating chicken soup or other clear broths, and maintaining good nutrition.  Getting plenty of rest.  Using gargles or lozenges for comfort.  Controlling fevers with ibuprofen or acetaminophen as directed by your caregiver.  Increasing usage of your inhaler if you have asthma. Zinc gel and zinc lozenges, taken in the first 24 hours of the common cold, can shorten the duration and lessen the severity of symptoms. Pain medicines may help with fever, muscle aches, and throat pain. A variety of non-prescription medicines are available to treat congestion and runny nose. Your caregiver   can make recommendations and may suggest nasal or lung inhalers for other symptoms.  HOME CARE INSTRUCTIONS   Only take over-the-counter or prescription medicines for pain, discomfort, or fever as directed by your  caregiver.  Use a warm mist humidifier or inhale steam from a shower to increase air moisture. This may keep secretions moist and make it easier to breathe.  Drink enough water and fluids to keep your urine clear or pale yellow.  Rest as needed.  Return to work when your temperature has returned to normal or as your caregiver advises. You may need to stay home longer to avoid infecting others. You can also use a face mask and careful hand washing to prevent spread of the virus. SEEK MEDICAL CARE IF:   After the first few days, you feel you are getting worse rather than better.  You need your caregiver's advice about medicines to control symptoms.  You develop chills, worsening shortness of breath, or brown or red sputum. These may be signs of pneumonia.  You develop yellow or brown nasal discharge or pain in the face, especially when you bend forward. These may be signs of sinusitis.  You develop a fever, swollen neck glands, pain with swallowing, or white areas in the back of your throat. These may be signs of strep throat. SEEK IMMEDIATE MEDICAL CARE IF:   You have a fever.  You develop severe or persistent headache, ear pain, sinus pain, or chest pain.  You develop wheezing, a prolonged cough, cough up blood, or have a change in your usual mucus (if you have chronic lung disease).  You develop sore muscles or a stiff neck. Document Released: 07/10/2000 Document Revised: 04/08/2011 Document Reviewed: 04/21/2013 ExitCare Patient Information 2015 [employer]. This information is not intended to replace advice given to you by your health care provider. Make sure you discuss any questions you have with your health care provider.  

## 2013-12-06 NOTE — Telephone Encounter (Signed)
Patient advised and scheduled.  

## 2013-12-06 NOTE — Progress Notes (Signed)
   Subjective:    Patient ID: Pamela Holloway, female    DOB: 1960/09/02, 53 y.o.   MRN: [id number]  HPI 4 days ago woke up with a sore throat. That has gradually gotten better and now she complains of significant bilateral sinus pressure, facial tenderness, headache and nasal congestion. She's also had a dry cough is intermittently productive.Husband has been sick as well. She denies any fevers chills or sweats. That her husband did have a fever with his illness and he's currently on antibiotics. She's worried about getting into her chest. Denies any shortness of breath or chest tightness.   Review of Systems     Objective:   Physical Exam  Constitutional: She is oriented to person, place, and time. She appears well-developed and well-nourished.  HENT:  Head: Normocephalic and atraumatic.  Right Ear: External ear normal.  Left Ear: External ear normal.  Nose: Nose normal.  Mouth/Throat: Oropharynx is clear and moist.  TMs and canals are clear.   Eyes: Conjunctivae and EOM are normal. Pupils are equal, round, and reactive to light.  Neck: Neck supple. No thyromegaly present.  Cardiovascular: Normal rate, regular rhythm and normal heart sounds.   Pulmonary/Chest: Effort normal and breath sounds normal. She has no wheezes.  Lymphadenopathy:    She has no cervical adenopathy.  Neurological: She is alert and oriented to person, place, and time.  Skin: Skin is warm and dry.  Psychiatric: She has a normal mood and affect.          Assessment & Plan:  URI - likely viral. Can fill rx at end of week if not better or sooner if develops fever.  Continue sxs care. H.O provided from article from [employer] about cold care.

## 2014-02-03 ENCOUNTER — Other Ambulatory Visit: Payer: Self-pay | Admitting: Family Medicine

## 2014-02-20 ENCOUNTER — Other Ambulatory Visit: Payer: Self-pay | Admitting: Family Medicine

## 2014-02-24 ENCOUNTER — Ambulatory Visit: Payer: BC Managed Care – PPO | Admitting: Family Medicine

## 2014-03-05 ENCOUNTER — Other Ambulatory Visit: Payer: Self-pay | Admitting: Family Medicine

## 2014-03-05 MED ORDER — GLIPIZIDE 10 MG PO TABS: 10.0000 mg | ORAL_TABLET | Freq: Two times a day (BID) | ORAL | Status: DC

## 2014-03-05 NOTE — Telephone Encounter (Signed)
Patient cancelled her follow up appointment on 02/24/2014. Sent 30 day supply to local pharmacy. She needs a follow up appointment.

## 2014-03-18 ENCOUNTER — Telehealth: Payer: Self-pay | Admitting: Family Medicine

## 2014-03-18 NOTE — Telephone Encounter (Signed)
Dr. Madilyn Fireman,     Ms. Hitchens called and asked if we could place a referral to [facility]. She has scheduled an appointment for Monday and was wanting to see if this would help with some of the pain in her back. - CF

## 2014-03-18 NOTE — Telephone Encounter (Signed)
Okay to place referral

## 2014-03-22 ENCOUNTER — Other Ambulatory Visit: Payer: Self-pay | Admitting: *Deleted

## 2014-03-22 DIAGNOSIS — M5030 Other cervical disc degeneration, unspecified cervical region: Secondary | ICD-10-CM

## 2014-03-23 NOTE — Telephone Encounter (Signed)
Tonya order referral.

## 2014-04-13 ENCOUNTER — Other Ambulatory Visit: Payer: Self-pay | Admitting: Family Medicine

## 2014-04-13 DIAGNOSIS — Z1239 Encounter for other screening for malignant neoplasm of breast: Secondary | ICD-10-CM

## 2014-04-28 ENCOUNTER — Ambulatory Visit (INDEPENDENT_AMBULATORY_CARE_PROVIDER_SITE_OTHER): Payer: BC Managed Care – PPO

## 2014-04-28 DIAGNOSIS — Z1231 Encounter for screening mammogram for malignant neoplasm of breast: Secondary | ICD-10-CM | POA: Diagnosis not present

## 2014-04-28 DIAGNOSIS — Z1239 Encounter for other screening for malignant neoplasm of breast: Secondary | ICD-10-CM

## 2014-05-04 ENCOUNTER — Other Ambulatory Visit: Payer: Self-pay | Admitting: Family Medicine

## 2014-05-19 ENCOUNTER — Other Ambulatory Visit: Payer: Self-pay | Admitting: Family Medicine

## 2014-07-11 ENCOUNTER — Telehealth: Payer: Self-pay | Admitting: Family Medicine

## 2014-07-11 DIAGNOSIS — M5416 Radiculopathy, lumbar region: Secondary | ICD-10-CM

## 2014-07-11 DIAGNOSIS — R748 Abnormal levels of other serum enzymes: Secondary | ICD-10-CM

## 2014-07-11 DIAGNOSIS — E785 Hyperlipidemia, unspecified: Secondary | ICD-10-CM

## 2014-07-11 DIAGNOSIS — IMO0002 Reserved for concepts with insufficient information to code with codable children: Secondary | ICD-10-CM

## 2014-07-11 DIAGNOSIS — E1165 Type 2 diabetes mellitus with hyperglycemia: Secondary | ICD-10-CM

## 2014-07-11 DIAGNOSIS — E039 Hypothyroidism, unspecified: Secondary | ICD-10-CM

## 2014-07-11 DIAGNOSIS — I10 Essential (primary) hypertension: Secondary | ICD-10-CM

## 2014-07-11 NOTE — Telephone Encounter (Signed)
Tonya    Ms. Holloway called and would like for you to send orders downstairs for her to have her Liver, Thyroid and sugar levels checked. She stated she would like to come this Friday to have this done due to having to send results to another doctor and if she is not able to get these labs on Friday she would like for you to please call her. - CF

## 2014-07-13 NOTE — Addendum Note (Signed)
Addended by: Teddy Spike on: 07/13/2014 02:05 PM   Modules accepted: Orders

## 2014-07-13 NOTE — Telephone Encounter (Signed)
Orders faxed.Pamela Holloway  

## 2014-07-15 LAB — HEPATIC FUNCTION PANEL
ALT: 72 U/L — ABNORMAL HIGH (ref 0–35)
AST: 64 U/L — ABNORMAL HIGH (ref 0–37)
Albumin: 4.1 g/dL (ref 3.5–5.2)
Alkaline Phosphatase: 89 U/L (ref 39–117)
Bilirubin, Direct: 0.2 mg/dL (ref 0.0–0.3)
Indirect Bilirubin: 1.2 mg/dL (ref 0.2–1.2)
Total Bilirubin: 1.4 mg/dL — ABNORMAL HIGH (ref 0.2–1.2)
Total Protein: 6.9 g/dL (ref 6.0–8.3)

## 2014-07-15 LAB — COMPLETE METABOLIC PANEL WITH GFR
ALK PHOS: 89 U/L (ref 39–117)
ALT: 72 U/L — AB (ref 0–35)
AST: 64 U/L — AB (ref 0–37)
Albumin: 4.1 g/dL (ref 3.5–5.2)
BILIRUBIN TOTAL: 1.4 mg/dL — AB (ref 0.2–1.2)
BUN: 10 mg/dL (ref 6–23)
CO2: 24 mEq/L (ref 19–32)
CREATININE: 0.72 mg/dL (ref 0.50–1.10)
Calcium: 9.1 mg/dL (ref 8.4–10.5)
Chloride: 101 mEq/L (ref 96–112)
GFR, Est African American: >89 mL/min
GFR, Est Non African American: >89 mL/min
Glucose, Bld: 252 mg/dL — ABNORMAL HIGH (ref 70–99)
Potassium: 4 mEq/L (ref 3.5–5.3)
Sodium: 139 mEq/L (ref 135–145)
Total Protein: 6.9 g/dL (ref 6.0–8.3)

## 2014-07-15 LAB — LIPID PANEL
Cholesterol: 191 mg/dL (ref 0–200)
HDL: 34 mg/dL — AB (ref >=46)
Total CHOL/HDL Ratio: 5.6 Ratio
Triglycerides: 454 mg/dL — ABNORMAL HIGH (ref <150)

## 2014-07-15 LAB — TSH: TSH: 3.819 u[IU]/mL (ref 0.350–4.500)

## 2014-07-15 LAB — HEMOGLOBIN A1C
HEMOGLOBIN A1C: 9.5 % — AB (ref <5.7)
Mean Plasma Glucose: 226 mg/dL — ABNORMAL HIGH (ref <117)

## 2014-07-28 ENCOUNTER — Other Ambulatory Visit: Payer: Self-pay | Admitting: Family Medicine

## 2014-08-12 ENCOUNTER — Ambulatory Visit (INDEPENDENT_AMBULATORY_CARE_PROVIDER_SITE_OTHER): Payer: BC Managed Care – PPO | Admitting: Family Medicine

## 2014-08-12 ENCOUNTER — Encounter: Payer: Self-pay | Admitting: Family Medicine

## 2014-08-12 VITALS — BP 160/82 | HR 80 | Ht 64.0 in | Wt 210.0 lb

## 2014-08-12 DIAGNOSIS — E785 Hyperlipidemia, unspecified: Secondary | ICD-10-CM

## 2014-08-12 DIAGNOSIS — E1165 Type 2 diabetes mellitus with hyperglycemia: Secondary | ICD-10-CM | POA: Diagnosis not present

## 2014-08-12 DIAGNOSIS — N3000 Acute cystitis without hematuria: Secondary | ICD-10-CM

## 2014-08-12 DIAGNOSIS — R350 Frequency of micturition: Secondary | ICD-10-CM

## 2014-08-12 DIAGNOSIS — I10 Essential (primary) hypertension: Secondary | ICD-10-CM

## 2014-08-12 DIAGNOSIS — IMO0001 Reserved for inherently not codable concepts without codable children: Secondary | ICD-10-CM

## 2014-08-12 LAB — POCT URINALYSIS DIPSTICK
Bilirubin, UA: NEGATIVE
GLUCOSE UA: 500
KETONES UA: NEGATIVE
Nitrite, UA: POSITIVE
PH UA: 5.5
Protein, UA: 30
Spec Grav, UA: 1.025
Urobilinogen, UA: 0.2

## 2014-08-12 LAB — POCT UA - MICROALBUMIN
Creatinine, POC: 200 mg/dL
MICROALBUMIN (UR) POC: 150 mg/L

## 2014-08-12 LAB — POCT GLYCOSYLATED HEMOGLOBIN (HGB A1C): Hemoglobin A1C: 10.2

## 2014-08-12 MED ORDER — PITAVASTATIN CALCIUM 2 MG PO TABS: 1.0000 | ORAL_TABLET | Freq: Every day | ORAL | Status: DC

## 2014-08-12 MED ORDER — SULFAMETHOXAZOLE-TRIMETHOPRIM 800-160 MG PO TABS: 1.0000 | ORAL_TABLET | Freq: Two times a day (BID) | ORAL | Status: DC

## 2014-08-12 NOTE — Progress Notes (Addendum)
   Subjective:    Patient ID: Pamela Holloway, female    DOB: Oct 17, 1960, 54 y.o.   MRN: [id number]  HPI Diabetes - no hypoglycemic events. No wounds or sores that are not healing well. No increased thirst or urination. Checking glucose at home. Taking medications as prescribed without any side effects.  Urinary frequency x 1 weeks. No dysuria or hematuria. No back pain, fevers chills or sweats.  Hypertension- Pt denies chest pain, SOB, dizziness, or heart palpitations.  Taking meds as directed w/o problems.  Denies medication side effects.  Just took BP meds right before she got here.   Hyperlipidemia- cholesterol try glycerin levels were quite elevated we did her blood work about a month ago. She's currently taking pravastatin at bedtime and has tolerated it well without any side effects. She has had previous side effects with Lipitor when we had tried that in the past.  Review of Systems     Objective:   Physical Exam  Constitutional: She is oriented to person, place, and time. She appears well-developed and well-nourished.  HENT:  Head: Normocephalic and atraumatic.  Cardiovascular: Normal rate, regular rhythm and normal heart sounds.   Pulmonary/Chest: Effort normal and breath sounds normal.  Neurological: She is alert and oriented to person, place, and time.  Skin: Skin is warm and dry.  Psychiatric: She has a normal mood and affect. Her behavior is normal.          Assessment & Plan:  DM- last A1c is 9.5.  Uncontrolled. Up from 7.5.  Discussed treatment options. Evidently she never actually started the [place]. She says it never got processed at the pharmacy. We have evidently since last October with 6 refills. 3 even if she did take it for a period of time she would have been out in the early spring. Will send over new prescription. Given a coupon card that makes it free for a year. Hopefully this will help get her blood sugars back under control. Encouraged her to continue to  work on diet and exercise. She her husband have been spent a lot of time working on their new [facility] she admits she hasn't been taking care of herself as well as she should. We'll need to consider insulin or one of the new medications like Victoza if she is not getting her A1c under control.  Urinary frequency- UA confirms UTI. Will tx with Bactrim.  Call if not better in one week.  HTN - patient is sick medications about 10 minutes before she came in today. She reports home blood pressures have been well controlled. Will follow-up in 3 months and recheck at that time.   hyperlipidemia-discussed switching to a more potent statin. Will discontinue pravastatin and switch to Livalo's she's had an intolerance to Lipitor. New prescriptions sent. Coupon card provided.  Colon cancer screening - she declined screening colonoscopy so we discussed Cologuard today. Handout provided with additional information. If she would like to have the test done then please give [place] a call back. I did encourage her to check with coverage with her insurance company.

## 2014-08-16 ENCOUNTER — Ambulatory Visit: Payer: BC Managed Care – PPO

## 2014-08-22 ENCOUNTER — Encounter: Payer: Self-pay | Admitting: Family Medicine

## 2014-08-22 ENCOUNTER — Ambulatory Visit (INDEPENDENT_AMBULATORY_CARE_PROVIDER_SITE_OTHER): Payer: BC Managed Care – PPO | Admitting: Family Medicine

## 2014-08-22 DIAGNOSIS — R300 Dysuria: Secondary | ICD-10-CM | POA: Diagnosis not present

## 2014-08-22 DIAGNOSIS — R350 Frequency of micturition: Secondary | ICD-10-CM | POA: Diagnosis not present

## 2014-08-22 LAB — POCT URINALYSIS DIPSTICK
BILIRUBIN UA: NEGATIVE
Blood, UA: NEGATIVE
KETONES UA: NEGATIVE
LEUKOCYTES UA: NEGATIVE
Nitrite, UA: NEGATIVE
Protein, UA: NEGATIVE
Spec Grav, UA: 1.01
Urobilinogen, UA: 0.2
pH, UA: 5.5

## 2014-08-22 NOTE — Progress Notes (Signed)
   Subjective:    Patient ID: Pamela Holloway, female    DOB: [birth year], 54 y.o.   MRN: [id number]  HPI    Review of Systems     Objective:   Physical Exam        Assessment & Plan:   urinary frequency-we'll await culture to confirm if UTIs cleared before we retreat for second time.  Beatrice Lecher, MD

## 2014-08-22 NOTE — Progress Notes (Signed)
Patient came into clinic today to retest urine. Pt reports she is still having some urinary frequency but never really had much dysuria. Pt completed her antibiotics treatment. Pt was able to provide a sample today, will preform a urine dipstick and a urine culture. Pt advised we would contact her regarding the results. Verbalized understanding, no further questions.

## 2014-08-23 ENCOUNTER — Telehealth: Payer: Self-pay | Admitting: *Deleted

## 2014-08-23 NOTE — Telephone Encounter (Signed)
lvm informing pt that the Ucx has not been resulted and it takes up to 3 days to get these results. Pamela Holloway, [name]

## 2014-08-24 LAB — URINE CULTURE
Colony Count: NO GROWTH
Organism ID, Bacteria: NO GROWTH

## 2014-08-24 NOTE — Telephone Encounter (Signed)
Pt informed of results.Pamela Holloway [place]  

## 2014-09-07 ENCOUNTER — Other Ambulatory Visit: Payer: Self-pay | Admitting: Family Medicine

## 2014-09-08 ENCOUNTER — Telehealth: Payer: Self-pay | Admitting: Sports Medicine

## 2014-09-08 ENCOUNTER — Telehealth: Payer: Self-pay

## 2014-09-08 NOTE — Telephone Encounter (Signed)
Spoke to patient gave her information as noted below. Allysen Lazo,CMA  

## 2014-09-08 NOTE — Telephone Encounter (Signed)
PLEASE SEE NOTE BELOW. WHEN I TALKED TO THE PATIENT SHE STATED THAT SHE WAS TOLD SHE HAD TO GET APPROVAL BECAUSE IT WAS AN EXPENSIVE INJECTION. PLEASE REVIEW PATIENT CHART FORM PREVIOUS VISIT TO SEE WHAT INJECTION PATIENT MAY NEED OR WHICH INJECTION SHE IS TALKING ABOUT. PLEASE ADVISE THAT INJECTION IT IS FOR HER HIP. Zakyah Pamela Holloway,CMA

## 2014-09-08 NOTE — Telephone Encounter (Signed)
Pt called and set up an appt to see you on aug 19 for her hip pain and would like to have another injection you had given her last year and she wants to make sure it will be approved by insurance before its done. Thanks

## 2014-09-08 NOTE — Telephone Encounter (Signed)
Hip injection doesn't need approval, knee injection (visco) does.

## 2014-09-12 ENCOUNTER — Telehealth: Payer: Self-pay | Admitting: Family Medicine

## 2014-09-12 NOTE — Telephone Encounter (Signed)
Received faxed form for prior authorization on Omega-3 Acid Ethyl Esters capsules filled out form and placed in Dr. Gardiner Ramus box for a signature. - CF

## 2014-09-16 ENCOUNTER — Ambulatory Visit (INDEPENDENT_AMBULATORY_CARE_PROVIDER_SITE_OTHER): Payer: BC Managed Care – PPO | Admitting: Sports Medicine

## 2014-09-16 ENCOUNTER — Encounter: Payer: Self-pay | Admitting: Sports Medicine

## 2014-09-16 DIAGNOSIS — M1611 Unilateral primary osteoarthritis, right hip: Secondary | ICD-10-CM

## 2014-09-16 MED ORDER — MELOXICAM 15 MG PO TABS: ORAL_TABLET | ORAL | Status: DC

## 2014-09-16 NOTE — Assessment & Plan Note (Signed)
Repeat femoral acetabular injection, the previous one was 13 months ago. She tells me she got about 6 weeks of relief. We are going to repeat her injection, I'm also going to have her he will meloxicam every day, return to see me in 4-6 weeks, referral to surgery if no better.

## 2014-09-16 NOTE — Progress Notes (Signed)
  Subjective:    CC: right hip pain  HPI: This is a pleasant 54 year female with right hip osteoarthritis, we injected her hip 13 months ago and she tells me she got about 6 weeks of relief. She is amenable to trying a repeat injection, she has not been regular with her anti-inflammatory's, pain is severe, persistent. Does not radiate, localized in the groin.  Past medical history, Surgical history, Family history not pertinant except as noted below, Social history, Allergies, and medications have been entered into the medical record, reviewed, and no changes needed.   Review of Systems: No fevers, chills, night sweats, weight loss, chest pain, or shortness of breath.   Objective:    General: Well Developed, well nourished, and in no acute distress.  Neuro: Alert and oriented x3, extra-ocular muscles intact, sensation grossly intact.  HEENT: Normocephalic, atraumatic, pupils equal round reactive to light, neck supple, no masses, no lymphadenopathy, thyroid nonpalpable.  Skin: Warm and dry, no rashes. Cardiac: Regular rate and rhythm, no murmurs rubs or gallops, no lower extremity edema.  Respiratory: Clear to auscultation bilaterally. Not using accessory muscles, speaking in full sentences. Right Hip: ROM IR: 20 Deg, ER: 60 Deg, Flexion: 120 Deg, Extension: 100 Deg, Abduction: 45 Deg, Adduction: 45 Deg, pain with internal rotation Strength IR: 5/5, ER: 5/5, Flexion: 5/5, Extension: 5/5, Abduction: 5/5, Adduction: 5/5 Pelvic alignment unremarkable to inspection and palpation. Standing hip rotation and gait without trendelenburg / unsteadiness. Greater trochanter without tenderness to palpation. No tenderness over piriformis. No SI joint tenderness and normal minimal SI movement.  Procedure: Real-time Ultrasound Guided Injection of right femoroacetabular joint Device: GE Logiq E  Verbal informed consent obtained.  Time-out conducted.  Noted no overlying erythema, induration, or other  signs of local infection.  Skin prepped in a sterile fashion.  Local anesthesia: Topical Ethyl chloride.  With sterile technique and under real time ultrasound guidance:  Spinal needle advanced in the femoral head/neck junction, 2 mL kenalog 40, 4 mL lidocaine injected easily Completed without difficulty  Pain immediately resolved suggesting accurate placement of the medication.  Advised to call if fevers/chills, erythema, induration, drainage, or persistent bleeding.  Images permanently stored and available for review in the ultrasound unit.  Impression: Technically successful ultrasound guided injection.  Impression and Recommendations:

## 2014-10-13 ENCOUNTER — Other Ambulatory Visit: Payer: Self-pay | Admitting: Family Medicine

## 2014-10-19 ENCOUNTER — Other Ambulatory Visit: Payer: Self-pay | Admitting: *Deleted

## 2014-10-19 MED ORDER — PITAVASTATIN CALCIUM 2 MG PO TABS: 1.0000 | ORAL_TABLET | Freq: Every day | ORAL | Status: DC

## 2014-10-24 ENCOUNTER — Other Ambulatory Visit: Payer: Self-pay | Admitting: Family Medicine

## 2014-10-28 ENCOUNTER — Ambulatory Visit (INDEPENDENT_AMBULATORY_CARE_PROVIDER_SITE_OTHER): Payer: BC Managed Care – PPO | Admitting: Sports Medicine

## 2014-10-28 ENCOUNTER — Ambulatory Visit (INDEPENDENT_AMBULATORY_CARE_PROVIDER_SITE_OTHER): Payer: BC Managed Care – PPO

## 2014-10-28 ENCOUNTER — Encounter: Payer: Self-pay | Admitting: Sports Medicine

## 2014-10-28 VITALS — BP 140/72 | HR 99 | Ht 64.0 in | Wt 206.0 lb

## 2014-10-28 DIAGNOSIS — M1611 Unilateral primary osteoarthritis, right hip: Secondary | ICD-10-CM

## 2014-10-28 DIAGNOSIS — M4802 Spinal stenosis, cervical region: Secondary | ICD-10-CM | POA: Diagnosis not present

## 2014-10-28 DIAGNOSIS — M5412 Radiculopathy, cervical region: Secondary | ICD-10-CM | POA: Diagnosis not present

## 2014-10-28 MED ORDER — MELOXICAM 15 MG PO TABS: ORAL_TABLET | ORAL | Status: DC

## 2014-10-28 NOTE — Assessment & Plan Note (Signed)
Clinically represents left C7. X-rays, meloxicam, formal physical therapy. Return in one month, MRI for interventional injection planning if no better.

## 2014-10-28 NOTE — Assessment & Plan Note (Signed)
Doing well after hip joint injection last month, previous injection provided 13 months of response

## 2014-10-28 NOTE — Progress Notes (Signed)
  Subjective:    CC: follow-up  HPI: Right femoral acetabular osteoarthritis: Doing well after hip joint injection last month, her previous injection provided 13 months of response.  Left arm numbness: No neck pain, numbness down the posterior aspect of the left arm to the second, third, and fourth fingers. Moderate, persistent without radiation, no lower study symptoms, no trauma, no constitutional symptoms or bowel or bladder dysfunction.  Past medical history, Surgical history, Family history not pertinant except as noted below, Social history, Allergies, and medications have been entered into the medical record, reviewed, and no changes needed.   Review of Systems: No fevers, chills, night sweats, weight loss, chest pain, or shortness of breath.   Objective:    General: Well Developed, well nourished, and in no acute distress.  Neuro: Alert and oriented x3, extra-ocular muscles intact, sensation grossly intact.  HEENT: Normocephalic, atraumatic, pupils equal round reactive to light, neck supple, no masses, no lymphadenopathy, thyroid nonpalpable.  Skin: Warm and dry, no rashes. Cardiac: Regular rate and rhythm, no murmurs rubs or gallops, no lower extremity edema.  Respiratory: Clear to auscultation bilaterally. Not using accessory muscles, speaking in full sentences. Neck: Negative spurling's Full neck range of motion Grip strength and sensation normal in bilateral hands Strength good C4 to T1 distribution No sensory change to C4 to T1 Reflexes normal  Impression and Recommendations:

## 2014-10-29 ENCOUNTER — Other Ambulatory Visit: Payer: Self-pay | Admitting: Family Medicine

## 2014-11-03 ENCOUNTER — Encounter: Payer: Self-pay | Admitting: Rehabilitative and Restorative Service Providers"

## 2014-11-03 ENCOUNTER — Ambulatory Visit (INDEPENDENT_AMBULATORY_CARE_PROVIDER_SITE_OTHER): Payer: BC Managed Care – PPO | Admitting: Rehabilitative and Restorative Service Providers"

## 2014-11-03 DIAGNOSIS — Z7409 Other reduced mobility: Secondary | ICD-10-CM | POA: Diagnosis not present

## 2014-11-03 DIAGNOSIS — M623 Immobility syndrome (paraplegic): Secondary | ICD-10-CM | POA: Diagnosis not present

## 2014-11-03 DIAGNOSIS — M25551 Pain in right hip: Secondary | ICD-10-CM

## 2014-11-03 DIAGNOSIS — M2560 Stiffness of unspecified joint, not elsewhere classified: Secondary | ICD-10-CM

## 2014-11-03 DIAGNOSIS — R293 Abnormal posture: Secondary | ICD-10-CM

## 2014-11-03 NOTE — Therapy (Signed)
[place], [place], [address] Phone: [phone]   Fax:  [phone]  Physical Therapy Evaluation  Patient Details  Name: Pamela Holloway MRN: [id number] Date of Birth: 1960-05-15 Referring Provider:  Silverio Decamp,*  Encounter Date: 11/03/2014      PT End of Session - 11/03/14 1811    Visit Number 1   Number of Visits 12   Date for PT Re-Evaluation 12/15/14   PT Start Time 2409   PT Stop Time 1759   PT Time Calculation (min) 54 min   Activity Tolerance Patient tolerated treatment well      Past Medical History  Diagnosis Date  . Hepatitis C   . Hypertension   . Thyroid disease     Hypothyroid    Past Surgical History  Procedure Laterality Date  . Cholecystectomy    . Tonsillectomy    . Laparoscopic gastric banding      There were no vitals filed for this visit.  Visit Diagnosis:  Hip pain, right - Plan: PT plan of care cert/re-cert  Stiffness due to immobility - Plan: PT plan of care cert/re-cert  Abnormal posture - Plan: PT plan of care cert/re-cert  Decreased mobility and endurance - Plan: PT plan of care cert/re-cert      Subjective Assessment - 11/03/14 1712    Subjective Patient reports Rt hip pain ofer the past 3 years with no known cause.Treated in PT ~ 2 years ago with no improvement to increase in symptoms. Symptoms increased ~ in the past 2 months. Injection 09/08/14 with some improvement but pain continues - more in groin but als pain in the posterior hip area.    How long can you sit comfortably? no limit   How long can you stand comfortably? 20-30 min   How long can you walk comfortably? when in pain 15 min    Patient Stated Goals get out of pain and not have the pain come back   Currently in Pain? Yes   Pain Score 4    Pain Location Hip   Pain Orientation Right   Pain Descriptors / Indicators Nagging;Dull;Sharp  sharp when moving sit to  stand   Pain  Type Chronic pain   Pain Radiating Towards in groin anteriorly and posterior hip    Pain Onset More than a month ago   Pain Frequency Intermittent   Aggravating Factors  moving from sit to stand; aggravating factors vary    Pain Relieving Factors meds; time; sitting            [facility] PT Assessment - 11/03/14 0001    Assessment   Medical Diagnosis Rt hip pain   Onset Date/Surgical Date 09/13/14   Hand Dominance Right   Next MD Visit 11/16   Prior Therapy yes ~ 3 years ago made symptoms worse   Precautions   Precautions None   Balance Screen   Has the patient fallen in the past 6 months Yes  fell up steps at [place] no injury   How many times? 1   Has the patient had a decrease in activity level because of a fear of falling?  No   Is the patient reluctant to leave their home because of a fear of falling?  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   Leisure household chores; sedentary siting on soft sofa    Observation/Other Assessments   Focus on Therapeutic Outcomes (FOTO)  35% limitation   Sensation   Additional Comments sharp pain into ant thigh;around knee; anterior leg to 2nd toe lasting a few seconds occuring 3-4 times/wk   Posture/Postural Control   Posture Comments Rt hemipelvis and hip slightly lower than Lt; Rt knee hyperextended; flexed forward at hips   AROM   Overall AROM Comments Lt hip ROM WFL's Rt hip limited and painful in flexion ~ 95 deg; ext neutral; adb ~ 35 deg    Lumbar Flexion 75%   Lumbar Extension 30%   Lumbar - Right Side Bend 60%   Lumbar - Left Side Bend 40%   Lumbar - Right Rotation 20%   Lumbar - Left Rotation 15%   Strength   Overall Strength Comments 5/5 bilat LE's   Flexibility   Hamstrings Lt 73 deg; 78 deg  Rt   Quadriceps heel to buttock ~ 5 inches bilat   ITB tight bilat   Piriformis tight bilatpain in Rt groin with testing Rt    Palpation   Spinal mobility tender CPA pressure lumbar spine    SI assessment  tender Rt with pressure   Palpation comment Significant tightness Rt hip flexors at iliopsoas; anterior quads; sartorius; hip adductors; piriformis; hip abductors   Special Tests    Special Tests --  painful figure 4; SLR; piriformis Rt w/ pain in the Rt groin   Ambulation/Gait   Gait Comments decreased wt bearing phase Rt LE; flexed forward at hips                    [facility] Adult PT Treatment/Exercise - 11/03/14 0001    Self-Care   Self-Care Other Self-Care Comments   Other Self-Care Comments  Pt educated on MFR with ball to affected area (sustained release); pt returned demo   Exercises   Exercises Lumbar;Neck   Lumbar Exercises: Stretches   Hip Flexor Stretch 3 reps;30 seconds  (Lt knee to chest, Rt leg off edge of table)   Lumbar Exercises: Supine   Other Supine Lumbar Exercises Ball release to Pt's Rt glute, adductor.    Lumbar Exercises: Prone   Other Prone Lumbar Exercises MFR with ball to hip flexor, held at tender spots until decreased tenderness.    Modalities   Modalities Moist Heat   Moist Heat Therapy   Number Minutes Moist Heat 10 Minutes   Moist Heat Location Lumbar Spine;Hip  anterior hip                 PT Education - 11/03/14 1755    Education provided Yes   Education Details HEP- MFR with ball to Rt post/ant hip, Rt hip flexor stretch; education re surfaces and positions for sitting/rest    Person(s) Educated Patient   Methods Explanation;Handout;Verbal cues;Tactile cues   Comprehension Returned demonstration;Verbalized understanding             PT Long Term Goals - 11/03/14 1818    PT LONG TERM GOAL #1   Title Patient I in HEP 12/15/14   Time 6   Period Weeks   Status New   PT LONG TERM GOAL #2   Title Decrease muscular  tightness Rt hip allowing improved hip ROM 12/15/14   Time 6   Period Weeks   Status New  PT LONG TERM GOAL #3   Title Improve posture and alignment with standing and walking 12/15/14   Time 6   Period Weeks   Status New   PT LONG TERM GOAL #4   Title Patient to tolerant siting/standing/walking > 30-60 min with min to no pain 12/15/14   Time 6   Period Weeks   Status New   PT LONG TERM GOAL #5   Title Improve FOTO to </= 25% limitation 12/15/14   Time 6   Period Weeks   Status New               Plan - 11/03/14 1812    Clinical Impression Statement Patient presents with 3 year history of Rt hip pain with flare up of symptoms in the past 2 months. She has limited trunk and LE ROM; poor posture and alignment; muscular tightness and pain with palpation through hip flexors/adductors/abductors; limited functional activity level. She will benefit from Physical Therapy to address problems as identified and improve functional activity level.    Pt will benefit from skilled therapeutic intervention in order to improve on the following deficits Postural dysfunction;Improper body mechanics;Decreased range of motion;Decreased mobility;Increased fascial restricitons;Decreased endurance;Decreased activity tolerance;Pain   Rehab Potential Good   PT Frequency 2x / week   PT Duration 6 weeks   PT Treatment/Interventions Patient/family education;ADLs/Self Care Home Management;Manual techniques;Dry needling;Cryotherapy;Electrical Stimulation;Moist Heat;Ultrasound   PT Next Visit Plan Review HEP; progress stretching for hip flexors; add core stabilization(3 part core only at this time) - no exercises requiring hip flexors to engage. Trial of modalities and manual work to hip flexors. Consider dry needling. Needs instruction in proper body mechanics for transfers/transitional movements.   PT Home Exercise Plan myofacial release work; stretches for HEP; education re chairs to sit or rest in - to avoid  sitting in soft surfaces in hip flexed position   Consulted and Agree with Plan of Care Patient         Problem List Patient Active Problem List   Diagnosis Date Noted  . Left cervical radiculopathy 10/28/2014  . Degenerative disc disease, cervical 09/10/2013  . Thoracic degenerative disc disease 09/10/2013  . Osteoarthritis of right hip 08/20/2013  . Right lumbar radiculopathy 08/20/2013  . 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 Diabetes mellitus type 2, uncontrolled, without complications ([place]) 23/76/2831  . History of hepatitis C virus infection 08/09/2008  . HYPERTENSION, BENIGN 07/05/2008  . Bariatric surgery status 06/29/2008  . HYPOTHYROIDISM NOS 11/03/2006  . Hyperlipidemia 11/03/2006  . COMMON MIGRAINE 11/03/2006    Sudeep Scheibel Nilda Simmer PT, MPH 11/03/2014, 6:23 PM  [facility] [place], [place], [address] Phone: [phone]   Fax:  [phone]

## 2014-11-07 ENCOUNTER — Encounter: Payer: BC Managed Care – PPO | Admitting: Physical Therapy

## 2014-11-07 ENCOUNTER — Encounter: Payer: Self-pay | Admitting: *Deleted

## 2014-11-07 ENCOUNTER — Emergency Department
Admission: EM | Admit: 2014-11-07 | Discharge: 2014-11-07 | Disposition: A | Discharge status: Home / Self Care | Payer: BC Managed Care – PPO | Source: Home / Self Care | Attending: Family Medicine | Admitting: Family Medicine

## 2014-11-07 DIAGNOSIS — W57XXXA Bitten or stung by nonvenomous insect and other nonvenomous arthropods, initial encounter: Secondary | ICD-10-CM | POA: Diagnosis not present

## 2014-11-07 DIAGNOSIS — L03032 Cellulitis of left toe: Secondary | ICD-10-CM | POA: Diagnosis not present

## 2014-11-07 DIAGNOSIS — Z23 Encounter for immunization: Secondary | ICD-10-CM | POA: Diagnosis not present

## 2014-11-07 DIAGNOSIS — T63421A Toxic effect of venom of ants, accidental (unintentional), initial encounter: Secondary | ICD-10-CM

## 2014-11-07 HISTORY — DX: Hyperlipidemia, unspecified: E78.5

## 2014-11-07 HISTORY — DX: Type 2 diabetes mellitus without complications: E11.9

## 2014-11-07 MED ORDER — TRIAMCINOLONE ACETONIDE 40 MG/ML IJ SUSP
40.0000 mg | Freq: Once | INTRAMUSCULAR | Status: AC
  Administered 2014-11-07: 40 mg via INTRAMUSCULAR

## 2014-11-07 MED ORDER — DOXYCYCLINE HYCLATE 100 MG PO CAPS: 100.0000 mg | ORAL_CAPSULE | Freq: Two times a day (BID) | ORAL | Status: DC

## 2014-11-07 MED ORDER — TETANUS-DIPHTH-ACELL PERTUSSIS 5-2.5-18.5 LF-MCG/0.5 IM SUSP
0.5000 mL | Freq: Once | INTRAMUSCULAR | Status: AC
  Administered 2014-11-07: 0.5 mL via INTRAMUSCULAR

## 2014-11-07 NOTE — ED Provider Notes (Signed)
CSN: [id number]     Arrival date & time 11/07/14  1557 History   First MD Initiated Contact with Patient 11/07/14 1636     Chief Complaint  Patient presents with  . Insect Bite      HPI Comments: Patient was picking up loose roofing shingles on the ground yesterday when she was stung by numerous fire ants on her hands and feet.  No wheezing, shortness of breath, or difficulty swallowing.  She has developed pain, itching, and swelling at bite sites, with appearance of a small vesicle at each bite.  A large vesicle with drainage and redness has appeared on the dorsum of her left great toe.  She feels well otherwise; no fevers, chills, and sweats.                                                                                                                                                  The history is provided by the patient.    Past Medical History  Diagnosis Date  . Hepatitis C   . Hypertension   . Thyroid disease     Hypothyroid  . Hyperlipidemia   . Diabetes mellitus without complication [facility])    Past Surgical History  Procedure Laterality Date  . Cholecystectomy    . Tonsillectomy    . Laparoscopic gastric banding     Family History  Problem Relation Age of Onset  . Diabetes Mother   . Hypertension Mother   . Heart disease Mother   . Diabetes Father   . Hypertension Father    Social History  Substance Use Topics  . Smoking status: Former Research scientist (life sciences)  . Smokeless tobacco: Never Used  . Alcohol Use: Yes     Comment: rare   OB History    Gravida Para Term Preterm AB TAB SAB Ectopic Multiple Living   2 2 2       2      Review of Systems  Constitutional: Negative.   HENT: Negative.   Eyes: Negative.   Respiratory: Negative.   Cardiovascular: Negative.   Gastrointestinal: Negative.   Genitourinary: Negative.   Musculoskeletal: Negative.   Skin: Positive for rash.  Neurological: Negative for headaches.    Allergies  Aspirin; Atorvastatin; and Invokana  Home  Medications   Prior to Admission medications   Medication Sig Start Date End Date Taking? Authorizing Provider  amitriptyline (ELAVIL) 50 MG tablet One half tab PO qHS for a week, then one tab PO qHS. 09/10/13   Silverio Decamp, MD  dapagliflozin propanediol (FARXIGA) 5 MG TABS tablet Take 5 mg by mouth daily. 11/24/13   Hali Marry, MD  doxycycline (VIBRAMYCIN) 100 MG capsule Take 1 capsule (100 mg total) by mouth 2 (two) times daily. Take with food. 11/07/14   Kandra Nicolas, MD  glipiZIDE (GLUCOTROL) 10 MG tablet Take  1 tablet (10 mg total) by mouth 2 (two) times daily. 03/05/14 03/05/15  Hali Marry, MD  hydrochlorothiazide (HYDRODIURIL) 12.5 MG tablet TAKE 1 TABLET DAILY 09/10/13   Hali Marry, MD  levothyroxine (SYNTHROID, LEVOTHROID) 150 MCG tablet Take 1 tablet (150 mcg total) by mouth daily before breakfast. 10/31/14   Hali Marry, MD  losartan-hydrochlorothiazide [facility]) 100-25 MG per tablet TAKE 1 TABLET DAILY 02/20/14   Hali Marry, MD  meloxicam (MOBIC) 15 MG tablet One tab PO qAM with breakfast for 2 weeks, then daily prn pain. 10/28/14   Silverio Decamp, MD  metFORMIN (GLUCOPHAGE) 1000 MG tablet TAKE 1 TABLET TWICE A DAY WITH MEALS (NEED FOLLOW UP WITH LAB WORK) 10/24/14   Hali Marry, MD  omega-3 acid ethyl esters (LOVAZA) 1 G capsule TAKE 2 CAPSULES TWICE A DAY 09/08/14   Hali Marry, MD  Pitavastatin Calcium (LIVALO) 2 MG TABS Take 1 tablet (2 mg total) by mouth at bedtime. 10/19/14   Hali Marry, MD  pravastatin (PRAVACHOL) 40 MG tablet Take 1 tablet (40 mg total) by mouth daily. 11/24/13   Hali Marry, MD  vitamin E 100 UNIT capsule Take by mouth daily.      Historical Provider, MD   Meds Ordered and Administered this Visit   Medications  triamcinolone acetonide (KENALOG-40) injection 40 mg (not administered)  Tdap (BOOSTRIX) injection 0.5 mL (not administered)    BP 169/98 mmHg  Pulse 85   Temp(Src) 97.9 F (36.6 C) (Oral)  Resp 16  Wt 205 lb (92.987 kg)  SpO2 99%  LMP 09/08/2011 No data found.   Physical Exam  Constitutional: She is oriented to person, place, and time. She appears well-developed and well-nourished. No distress.  HENT:  Head: Atraumatic.  Nose: Nose normal.  Mouth/Throat: Oropharynx is clear and moist.  Eyes: Conjunctivae are normal. Pupils are equal, round, and reactive to light.  Neck: Neck supple.  Cardiovascular: Normal heart sounds.   Pulmonary/Chest: Breath sounds normal.  Abdominal: There is no tenderness.  Musculoskeletal: She exhibits no edema.       Hands:      Feet:  Left great toe has 50mm vesicle with clear yellow fluid  on dorsum with surrounding erythema and tenderness to palpation.  Toe has good range of motion.  There are several smaller bite sites on dorsa of feet.  Both hands have several scattered erythematous bite sites on dorsa.  Lymphadenopathy:    She has no cervical adenopathy.  Neurological: She is alert and oriented to person, place, and time.  Skin: Skin is warm and dry. Rash noted.  Nursing note and vitals reviewed.   ED Course  Procedures  None    Labs Reviewed  WOUND CULTURE     MDM   1. Fire ant sting   2. Cellulitis of great toe, left    Kenalog 40mg  IM.  Wound culture pending.  Tdap administered. Begin doxycycline 100mg  BID for staph coverage. May continue Benadryl 25mg , two caps every 4 to 6 hours.  Keep wound on left great toe bandaged until healed, and change daily. Followup with Family Doctor if not improved in about 3 to 4 days.    Kandra Nicolas, MD 11/07/14 [phone]

## 2014-11-07 NOTE — ED Notes (Signed)
Pt c/o fire ant bites all over x 1 day. She has taken Benadryl q 2 hrs.

## 2014-11-07 NOTE — Discharge Instructions (Signed)
May continue Benadryl 25mg , two caps every 4 to 6 hours.  Keep wound on left great toe bandaged until healed, and change daily.   Cellulitis Cellulitis is an infection of the skin and the tissue beneath it. The infected area is usually red and tender. Cellulitis occurs most often in the arms and lower legs.  CAUSES  Cellulitis is caused by bacteria that enter the skin through cracks or cuts in the skin. The most common types of bacteria that cause cellulitis are staphylococci and streptococci. SIGNS AND SYMPTOMS   Redness and warmth.  Swelling.  Tenderness or pain.  Fever. DIAGNOSIS  Your health care provider can usually determine what is wrong based on a physical exam. Blood tests may also be done. TREATMENT  Treatment usually involves taking an antibiotic medicine. HOME CARE INSTRUCTIONS   Take your antibiotic medicine as directed by your health care provider. Finish the antibiotic even if you start to feel better.  Keep the infected arm or leg elevated to reduce swelling.  Apply a warm cloth to the affected area up to 4 times per day to relieve pain.  Take medicines only as directed by your health care provider.  Keep all follow-up visits as directed by your health care provider. SEEK MEDICAL CARE IF:   You notice red streaks coming from the infected area.  Your red area gets larger or turns dark in color.  Your bone or joint underneath the infected area becomes painful after the skin has healed.  Your infection returns in the same area or another area.  You notice a swollen bump in the infected area.  You develop new symptoms.  You have a fever. SEEK IMMEDIATE MEDICAL CARE IF:   You feel very sleepy.  You develop vomiting or diarrhea.  You have a general ill feeling (malaise) with muscle aches and pains.   This information is not intended to replace advice given to you by your health care provider. Make sure you discuss any questions you have with your health  care provider.   Document Released: 10/24/2004 Document Revised: 10/05/2014 Document Reviewed: 04/01/2011 Elsevier Interactive Patient Education [employer].

## 2014-11-10 ENCOUNTER — Ambulatory Visit (INDEPENDENT_AMBULATORY_CARE_PROVIDER_SITE_OTHER): Payer: BC Managed Care – PPO | Admitting: Rehabilitative and Restorative Service Providers"

## 2014-11-10 ENCOUNTER — Telehealth: Payer: Self-pay | Admitting: *Deleted

## 2014-11-10 DIAGNOSIS — M623 Immobility syndrome (paraplegic): Secondary | ICD-10-CM

## 2014-11-10 DIAGNOSIS — Z7409 Other reduced mobility: Secondary | ICD-10-CM | POA: Diagnosis not present

## 2014-11-10 DIAGNOSIS — M25551 Pain in right hip: Secondary | ICD-10-CM | POA: Diagnosis not present

## 2014-11-10 DIAGNOSIS — R293 Abnormal posture: Secondary | ICD-10-CM

## 2014-11-10 DIAGNOSIS — M2560 Stiffness of unspecified joint, not elsewhere classified: Secondary | ICD-10-CM

## 2014-11-10 LAB — WOUND CULTURE
GRAM STAIN: NONE SEEN
GRAM STAIN: NONE SEEN
Gram Stain: NONE SEEN
Organism ID, Bacteria: NO GROWTH
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
## 2014-11-10 NOTE — Patient Instructions (Signed)
Ball stretch lying on back with 4" ball in back of Rt hip  Keep left knee bent,straighten Rt leg  Fel stretch through the front of the Right hip  Hold 60 sec or as tolerated  2-3 times/day   Standing at wall  Push hips away froom wall  Hold 20-30 sec Several reps  Several times per day especially after sitting

## 2014-11-14 ENCOUNTER — Ambulatory Visit (INDEPENDENT_AMBULATORY_CARE_PROVIDER_SITE_OTHER): Payer: BC Managed Care – PPO | Admitting: Physical Therapy

## 2014-11-14 DIAGNOSIS — M623 Immobility syndrome (paraplegic): Secondary | ICD-10-CM | POA: Diagnosis not present

## 2014-11-14 DIAGNOSIS — M2560 Stiffness of unspecified joint, not elsewhere classified: Secondary | ICD-10-CM

## 2014-11-14 DIAGNOSIS — Z7409 Other reduced mobility: Secondary | ICD-10-CM | POA: Diagnosis not present

## 2014-11-14 DIAGNOSIS — R293 Abnormal posture: Secondary | ICD-10-CM

## 2014-11-14 DIAGNOSIS — M25551 Pain in right hip: Secondary | ICD-10-CM

## 2014-11-14 NOTE — Patient Instructions (Addendum)
Adductor Stretch - Supine    Lie in bed, knees bent, feet flat. Keeping feet together, lower knees toward bed until stretch felt at inner thighs.  You can rest knees on pillow to help tolerate position.  Repeat __3_ times. Do _1__ times per day.  aAbdominal Bracing With Pelvic Floor (Hook-Lying)    With neutral spine, tighten pelvic floor and abdominals. Hold 5-10 seconds. Repeat _10__ times. Do _1__ times a day.   Copyright  VHI. All rights reserved.   [facility] [address] [place], [place] [address]  [phone] (office) [phone] (fax)

## 2014-11-14 NOTE — Therapy (Signed)
[place], [place], [address] Phone: [phone]   Fax:  [phone]  Physical Therapy Treatment  Patient Details  Name: Pamela Holloway MRN: [id number] Date of Birth: 06-27-1960 No Data Recorded  Encounter Date: 11/14/2014      PT End of Session - 11/14/14 0748    Visit Number 3   Number of Visits 12   Date for PT Re-Evaluation 12/15/14   PT Start Time 0705   PT Stop Time 0759   PT Time Calculation (min) 54 min      Past Medical History  Diagnosis Date  . Hepatitis C   . Hypertension   . Thyroid disease     Hypothyroid  . Hyperlipidemia   . Diabetes mellitus without complication [facility])     Past Surgical History  Procedure Laterality Date  . Cholecystectomy    . Tonsillectomy    . Laparoscopic gastric banding      There were no vitals filed for this visit.  Visit Diagnosis:  Hip pain, right  Stiffness due to immobility  Abnormal posture  Decreased mobility and endurance      Subjective Assessment - 11/14/14 0709    Subjective Pt reports her pain is higher first thing in the morning, and it decreases as day goes on.  "[name] was a great day; hardly any pain".  Sitting long periods still is an aggrivating factor.    Currently in Pain? Yes   Pain Score 5    Pain Descriptors / Indicators Aching   Aggravating Factors  walking or moving a certain way.    Pain Relieving Factors heat, medicine.             [facility] PT Assessment - 11/14/14 0001    Assessment   Medical Diagnosis Rt hip pain   Onset Date/Surgical Date 09/13/14   Hand Dominance Right   Next MD Visit 11/16   Prior Therapy yes ~ 3 years ago made symptoms worse                     [facility] Adult PT Treatment/Exercise - 11/14/14 0001    Lumbar Exercises: Stretches   Hip Flexor Stretch 3 reps;30 seconds  (Lt knee to chest, Rt leg off edge of table)   Lumbar Exercises: Aerobic   Stationary Bike  NuStep L3: 5 min    Lumbar Exercises: Standing   Other Standing Lumbar Exercises Standing hip extension with upper back against wall.   Lumbar Exercises: Supine   Ab Set 5 reps;3 seconds   Other Supine Lumbar Exercises Ball release to Pt's Rt glute, adductor.    Other Supine Lumbar Exercises trial of butterfly stretch x 25 sec RLE supported    Lumbar Exercises: Sidelying   Clam 10 reps   Clam Limitations (with TPR to piriformis/ glute med)    Modalities   Modalities Electrical Stimulation   Moist Heat Therapy   Number Minutes Moist Heat 15 Minutes   Moist Heat Location Hip  Rt ant/post hip    Electrical Stimulation   Electrical Stimulation Location Rt adductors and Rt post hip    Electrical Stimulation Action premod to each area   Electrical Stimulation Parameters to tolerance    Electrical Stimulation Goals Pain   Manual Therapy   Manual therapy comments pt supine LE's on bolster    Soft tissue mobilization manual work through Rt hip flexors and adductors - through clothing  PT Education - 11/14/14 0752    Education provided Yes   Education Details Added gentle hip adductor stretch.     Person(s) Educated Patient   Methods Explanation   Comprehension Verbalized understanding             PT Long Term Goals - 11/03/14 1818    PT LONG TERM GOAL #1   Title Patient I in HEP 12/15/14   Time 6   Period Weeks   Status New   PT LONG TERM GOAL #2   Title Decrease muscular tightness Rt hip allowing improved hip ROM 12/15/14   Time 6   Period Weeks   Status New   PT LONG TERM GOAL #3   Title Improve posture and alignment with standing and walking 12/15/14   Time 6   Period Weeks   Status New   PT LONG TERM GOAL #4   Title Patient to tolerant siting/standing/walking > 30-60 min with min to no pain 12/15/14   Time 6   Period Weeks   Status New   PT LONG TERM GOAL #5   Title Improve FOTO to </= 25% limitation 12/15/14   Time 6   Period Weeks    Status New               Plan - 11/14/14 5625    Clinical Impression Statement Pt continues with tight and tender Rt hip. Pt responds well to manual work and gentle stretches.  Pt reported decrease in symptoms with this work and further reduction with MHP and estim to adductors / piriformis.   Progressing slowly towards goals.    Pt will benefit from skilled therapeutic intervention in order to improve on the following deficits Postural dysfunction;Improper body mechanics;Decreased range of motion;Decreased mobility;Increased fascial restricitons;Decreased endurance;Decreased activity tolerance;Pain   Rehab Potential Good   PT Frequency 2x / week   PT Duration 6 weeks   PT Treatment/Interventions Patient/family education;ADLs/Self Care Home Management;Manual techniques;Dry needling;Cryotherapy;Electrical Stimulation;Moist Heat;Ultrasound   PT Next Visit Plan Review HEP; progress stretching for hip flexors; add core stabilization(3 part core only at this time) - no exercises requiring hip flexors to engage. Trial of modalities and manual work to hip flexors. Consider dry needling. Needs instruction in proper body mechanics for transfers/transitional movements.        Problem List Patient Active Problem List   Diagnosis Date Noted  . Left cervical radiculopathy 10/28/2014  . Degenerative disc disease, cervical 09/10/2013  . Thoracic degenerative disc disease 09/10/2013  . Osteoarthritis of right hip 08/20/2013  . Right lumbar radiculopathy 08/20/2013  . 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 Diabetes mellitus type 2, uncontrolled, without complications ([place]) 63/89/3734  . History of hepatitis C virus infection 08/09/2008  . HYPERTENSION, BENIGN 07/05/2008  . Bariatric surgery status 06/29/2008  . HYPOTHYROIDISM NOS 11/03/2006  . Hyperlipidemia 11/03/2006  . COMMON MIGRAINE 11/03/2006    [name], PTA 11/14/2014 5:08 PM  [place], [place], [address] Phone: [phone]   Fax:  [phone]  Name: Pamela Holloway MRN: [id number] Date of Birth: January 24, 1961

## 2014-11-15 ENCOUNTER — Other Ambulatory Visit: Payer: Self-pay | Admitting: Family Medicine

## 2014-11-18 ENCOUNTER — Ambulatory Visit (INDEPENDENT_AMBULATORY_CARE_PROVIDER_SITE_OTHER): Payer: BC Managed Care – PPO | Admitting: Family Medicine

## 2014-11-18 ENCOUNTER — Encounter: Payer: Self-pay | Admitting: Family Medicine

## 2014-11-18 VITALS — BP 165/77 | HR 78 | Temp 97.9°F | Resp 18 | Wt 206.3 lb

## 2014-11-18 DIAGNOSIS — E1165 Type 2 diabetes mellitus with hyperglycemia: Secondary | ICD-10-CM

## 2014-11-18 DIAGNOSIS — IMO0001 Reserved for inherently not codable concepts without codable children: Secondary | ICD-10-CM

## 2014-11-18 DIAGNOSIS — Z0000 Encounter for general adult medical examination without abnormal findings: Secondary | ICD-10-CM

## 2014-11-18 DIAGNOSIS — I10 Essential (primary) hypertension: Secondary | ICD-10-CM

## 2014-11-18 LAB — POCT GLYCOSYLATED HEMOGLOBIN (HGB A1C): Hemoglobin A1C: 8.9

## 2014-11-18 MED ORDER — HYDROCHLOROTHIAZIDE 25 MG PO TABS: 12.5000 mg | ORAL_TABLET | Freq: Every day | ORAL | Status: DC

## 2014-11-18 MED ORDER — DAPAGLIFLOZIN PROPANEDIOL 10 MG PO TABS: 10.0000 mg | ORAL_TABLET | Freq: Every day | ORAL | Status: DC

## 2014-11-18 NOTE — Progress Notes (Signed)
   Subjective:    Patient ID: JAKIYA BOOKBINDER, female    DOB: 1960-06-26, 54 y.o.   MRN: [id number]  HPI Diabetes - no hypoglycemic events. No wounds or sores that are not healing well. No increased thirst or urination. Checking glucose at home. Taking medications as prescribed without any side effects. Last a1C was 10.2.   Doing well on the Farxiga.    Hypertension- Pt denies chest pain, SOB, dizziness, or heart palpitations.  Taking meds as directed w/o problems.  Denies medication side effects.      Review of Systems     Objective:   Physical Exam  Constitutional: She is oriented to person, place, and time. She appears well-developed and well-nourished.  HENT:  Head: Normocephalic and atraumatic.  Cardiovascular: Normal rate, regular rhythm and normal heart sounds.   Pulmonary/Chest: Effort normal and breath sounds normal.  Neurological: She is alert and oriented to person, place, and time.  Skin: Skin is warm and dry.  Psychiatric: She has a normal mood and affect. Her behavior is normal.       Assessment & Plan:  DM-  A1C down to 8.9. Great work. Increase Farxiga. Now that he rhip is feeling better she is hoping to start walking again.   Foot exam performed today. Reminded to get eye exam.   HTN - uncontrolled. Will inc HCTZ to 25. Will monitor for dehydration or dizziness.    Declines colonoscopy.

## 2014-11-18 NOTE — Patient Instructions (Signed)
There are several great [facility] is here in [place]. These include [facility] which is off [address], and [employer] which is on [address] as well as my eye Dr. on [address].

## 2014-11-21 ENCOUNTER — Other Ambulatory Visit: Payer: Self-pay | Admitting: *Deleted

## 2014-11-21 ENCOUNTER — Ambulatory Visit (INDEPENDENT_AMBULATORY_CARE_PROVIDER_SITE_OTHER): Payer: BC Managed Care – PPO | Admitting: Physical Therapy

## 2014-11-21 DIAGNOSIS — M2560 Stiffness of unspecified joint, not elsewhere classified: Secondary | ICD-10-CM

## 2014-11-21 DIAGNOSIS — M25551 Pain in right hip: Secondary | ICD-10-CM | POA: Diagnosis not present

## 2014-11-21 DIAGNOSIS — E1165 Type 2 diabetes mellitus with hyperglycemia: Principal | ICD-10-CM

## 2014-11-21 DIAGNOSIS — R293 Abnormal posture: Secondary | ICD-10-CM

## 2014-11-21 DIAGNOSIS — M623 Immobility syndrome (paraplegic): Secondary | ICD-10-CM

## 2014-11-21 DIAGNOSIS — IMO0001 Reserved for inherently not codable concepts without codable children: Secondary | ICD-10-CM

## 2014-11-21 DIAGNOSIS — Z7409 Other reduced mobility: Secondary | ICD-10-CM

## 2014-11-21 MED ORDER — DAPAGLIFLOZIN PROPANEDIOL 10 MG PO TABS: 10.0000 mg | ORAL_TABLET | Freq: Every day | ORAL | Status: DC

## 2014-11-21 NOTE — Therapy (Signed)
[place], [place], [address] Phone: [phone]   Fax:  [phone]  Physical Therapy Treatment  Patient Details  Name: Pamela Holloway MRN: [id number] Date of Birth: 1960/11/22 No Data Recorded  Encounter Date: 11/21/2014      PT End of Session - 11/21/14 0711    Visit Number 4   Number of Visits 12   Date for PT Re-Evaluation 12/15/14   PT Start Time 0700   PT Stop Time 0749   PT Time Calculation (min) 49 min      Past Medical History  Diagnosis Date  . Hepatitis C   . Hypertension   . Thyroid disease     Hypothyroid  . Hyperlipidemia   . Diabetes mellitus without complication [facility])     Past Surgical History  Procedure Laterality Date  . Cholecystectomy    . Tonsillectomy    . Laparoscopic gastric banding      There were no vitals filed for this visit.  Visit Diagnosis:  Hip pain, right  Stiffness due to immobility  Abnormal posture  Decreased mobility and endurance      Subjective Assessment - 11/21/14 0706    Subjective Pt reports she had felt improvement in Rt hip, but did a little more walking this weekend and noticed increase in pain (8/10).  Self massage to front of hip helps decrease pain.    Currently in Pain? Yes   Pain Score 2    Pain Location Hip   Pain Orientation Right;Anterior   Pain Descriptors / Indicators Sharp   Aggravating Factors  walking    Pain Relieving Factors massage, heat             [facility] PT Assessment - 11/21/14 0001    Assessment   Medical Diagnosis Rt hip pain   Onset Date/Surgical Date 09/13/14   Hand Dominance Right   Next MD Visit 11/16   Prior Therapy yes ~ 3 years ago made symptoms worse            [facility] Adult PT Treatment/Exercise - 11/21/14 0001    Lumbar Exercises: Stretches   Hip Flexor Stretch 3 reps;30 seconds  (Lt knee to chest, Rt leg off edge of table)   Lumbar Exercises: Aerobic   Stationary Bike NuStep  L3: 5 min   Pt abducts Lt hip for improved tolerance   Modalities   Modalities Electrical Stimulation;Moist Heat   Moist Heat Therapy   Number Minutes Moist Heat 15 Minutes   Moist Heat Location Hip   Electrical Stimulation   Electrical Stimulation Location Rt adductors and Rt post hip    Electrical Stimulation Action premod to each area   Electrical Stimulation Parameters to tolerance    Electrical Stimulation Goals Pain   Manual Therapy   Manual Therapy Manual Traction;Myofascial release   Myofascial Release to Rt adductor, Rt glute med/ pirifomis, psoas/ iliacus    Manual Traction Long leg traction to RLE x 20 sec x 4 reps            PT Long Term Goals - 11/03/14 1818    PT LONG TERM GOAL #1   Title Patient I in HEP 12/15/14   Time 6   Period Weeks   Status New   PT LONG TERM GOAL #2   Title Decrease muscular tightness Rt hip allowing improved hip ROM 12/15/14   Time 6   Period Weeks   Status New   PT LONG  TERM GOAL #3   Title Improve posture and alignment with standing and walking 12/15/14   Time 6   Period Weeks   Status New   PT LONG TERM GOAL #4   Title Patient to tolerant siting/standing/walking > 30-60 min with min to no pain 12/15/14   Time 6   Period Weeks   Status New   PT LONG TERM GOAL #5   Title Improve FOTO to </= 25% limitation 12/15/14   Time 6   Period Weeks   Status New               Plan - 11/21/14 1653    Clinical Impression Statement Pt had good response with long leg traction and manual therapy to Rt hip adductors/psoas, further reduction of pain with use of estim/ MHP.  Progressing slowly towards goals.    Pt will benefit from skilled therapeutic intervention in order to improve on the following deficits Postural dysfunction;Improper body mechanics;Decreased range of motion;Decreased mobility;Increased fascial restricitons;Decreased endurance;Decreased activity tolerance;Pain   Rehab Potential Good   PT Frequency 2x / week   PT  Duration 6 weeks   PT Treatment/Interventions Patient/family education;ADLs/Self Care Home Management;Manual techniques;Dry needling;Cryotherapy;Electrical Stimulation;Moist Heat;Ultrasound   PT Next Visit Plan Continue manual therapy to Rt adductor/ hip flexor/ piriformis.  Add core series.  Consider dry needling.    Consulted and Agree with Plan of Care Patient        Problem List Patient Active Problem List   Diagnosis Date Noted  . Left cervical radiculopathy 10/28/2014  . Degenerative disc disease, cervical 09/10/2013  . Thoracic degenerative disc disease 09/10/2013  . Osteoarthritis of right hip 08/20/2013  . Right lumbar radiculopathy 08/20/2013  . 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 Diabetes mellitus type 2, uncontrolled, without complications ([place]) 03/00/9233  . History of hepatitis C virus infection 08/09/2008  . HYPERTENSION, BENIGN 07/05/2008  . Bariatric surgery status 06/29/2008  . HYPOTHYROIDISM NOS 11/03/2006  . Hyperlipidemia 11/03/2006  . COMMON MIGRAINE 11/03/2006    [name], PTA 11/21/2014 4:55 PM  [place], [place], [address] Phone: [phone]   Fax:  [phone]  Name: Pamela Holloway MRN: [id number] Date of Birth: 21-Jan-1961

## 2014-11-23 ENCOUNTER — Other Ambulatory Visit: Payer: Self-pay | Admitting: Gynecology

## 2014-11-23 DIAGNOSIS — Z9289 Personal history of other medical treatment: Secondary | ICD-10-CM

## 2014-11-25 ENCOUNTER — Other Ambulatory Visit: Payer: Self-pay | Admitting: Sports Medicine

## 2014-11-25 DIAGNOSIS — M1611 Unilateral primary osteoarthritis, right hip: Secondary | ICD-10-CM

## 2014-12-01 ENCOUNTER — Ambulatory Visit (INDEPENDENT_AMBULATORY_CARE_PROVIDER_SITE_OTHER): Payer: BC Managed Care – PPO | Admitting: Rehabilitative and Restorative Service Providers"

## 2014-12-01 ENCOUNTER — Encounter: Payer: Self-pay | Admitting: Rehabilitative and Restorative Service Providers"

## 2014-12-01 DIAGNOSIS — M25551 Pain in right hip: Secondary | ICD-10-CM

## 2014-12-01 DIAGNOSIS — R293 Abnormal posture: Secondary | ICD-10-CM | POA: Diagnosis not present

## 2014-12-01 DIAGNOSIS — Z7409 Other reduced mobility: Secondary | ICD-10-CM

## 2014-12-01 DIAGNOSIS — M2560 Stiffness of unspecified joint, not elsewhere classified: Secondary | ICD-10-CM

## 2014-12-01 DIAGNOSIS — M623 Immobility syndrome (paraplegic): Secondary | ICD-10-CM

## 2014-12-01 NOTE — Therapy (Signed)
[place], [place], [address] Phone: [phone]   Fax:  [phone]  Physical Therapy Treatment  Patient Details  Name: Pamela Holloway MRN: [id number] Date of Birth: 1960-03-04 No Data Recorded  Encounter Date: 12/01/2014      PT End of Session - 12/01/14 1705    Visit Number 5   Number of Visits 12   Date for PT Re-Evaluation 12/15/14   PT Start Time 1700   PT Stop Time 1752   PT Time Calculation (min) 52 min   Activity Tolerance Patient tolerated treatment well      Past Medical History  Diagnosis Date  . Hepatitis C   . Hypertension   . Thyroid disease     Hypothyroid  . Hyperlipidemia   . Diabetes mellitus without complication [facility])     Past Surgical History  Procedure Laterality Date  . Cholecystectomy    . Tonsillectomy    . Laparoscopic gastric banding      There were no vitals filed for this visit.  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 Sitting for prolonged periods of time increased symptoms. She does have times when she is pain free. Pain typically does not last as long. The stretching does help    How long can you stand comfortably? 1 hour   How long can you walk comfortably? 15 min    Currently in Pain? Yes   Pain Score 2    Pain Orientation Right;Anterior   Pain Descriptors / Indicators Nagging   Pain Type Chronic pain            [facility] PT Assessment - 12/01/14 0001    Assessment   Medical Diagnosis Rt hip pain   Onset Date/Surgical Date 09/13/14   Hand Dominance Right   Next MD Visit 11/16   Prior Therapy yes ~ 3 years ago made symptoms worse   AROM   Overall AROM Comments Lt hip ROM WFL's Rt hip limited with  less pain in flexion ~ 105 deg; ext 5 deg; adb ~ 40 deg    Lumbar Flexion 80%   Lumbar Extension 30%   Lumbar - Right Side Bend 65%   Lumbar - Left Side Bend 60%   Lumbar - Right Rotation 25%   Lumbar - Left Rotation 20%   Flexibility   Hamstrings Lt 84 deg; Rt 85 deg   Quadriceps heel to buttock ~ 5 inches bilat   ITB tight bilat   Piriformis tight bilatpain in Rt groin with testing Rt                      [facility] Adult PT Treatment/Exercise - 12/01/14 0001    Lumbar Exercises: Stretches   Hip Flexor Stretch 3 reps;30 seconds  (Lt knee to chest, Rt leg off edge of table)   Pelvic Tilt --  hip flexor in half kneeling 30 sec x 3   Lumbar Exercises: Aerobic   Stationary Bike NuStep L5: 5 min   Pt abducts Lt hip for improved tolerance   Lumbar Exercises: Standing   Other Standing Lumbar Exercises Standing hip extension with upper back against wall.   Lumbar Exercises: Supine  Ab Set 5 reps  10 sec hold   Lumbar Exercises: Prone   Straight Leg Raise 10 reps;1 second;2 seconds  alternating with pelvic press   Modalities   Modalities Electrical Stimulation;Moist Heat   Moist Heat Therapy   Number Minutes Moist Heat 15 Minutes   Moist Heat Location Hip   Electrical Stimulation   Electrical Stimulation Location Rt adductors and Rt post hip    Electrical Stimulation Action IFC   Electrical Stimulation Parameters to tolerance   Electrical Stimulation Goals Pain   Manual Therapy   Manual Therapy Soft tissue mobilization;Myofascial release;Manual Traction   Manual therapy comments pt supine LE's on bolster    Soft tissue mobilization manual work through Rt hip flexors and adductors - through clothing    Myofascial Release to Rt adductor, Rt glute med/ pirifomis, psoas/ iliacus    Manual Traction Long leg traction to RLE x 20 sec x 4 reps    Other Manual Therapy deep tissue/TP release work hip flexors/adductors to synthesis pubis                 PT Education  - 12/01/14 1731    Education provided Yes   Education Details added prone hip extension and hip flexor in half kneeling    Person(s) Educated Patient   Methods Explanation;Demonstration;Tactile cues;Verbal cues;Handout   Comprehension Verbalized understanding;Returned demonstration;Verbal cues required;Tactile cues required             PT Long Term Goals - 12/01/14 1753    PT LONG TERM GOAL #1   Title Patient I in HEP 12/15/14   Time 6   Period Weeks   Status On-going   PT LONG TERM GOAL #2   Title Decrease muscular tightness Rt hip allowing improved hip ROM 12/15/14   Baseline progressing well    Time 6   Period Weeks   Status On-going   PT LONG TERM GOAL #3   Title Improve posture and alignment with standing and walking 12/15/14   Baseline improved standing posture and gait pattern   Time 6   Period Weeks   Status On-going   PT LONG TERM GOAL #4   Title Patient to tolerant siting/standing/walking > 30-60 min with min to no pain 12/15/14   Time 6   Period Weeks   Status On-going   PT LONG TERM GOAL #5   Title Improve FOTO to </= 25% limitation 12/15/14   Time 6   Period Weeks   Status On-going               Plan - 12/01/14 1732    Clinical Impression Statement Continued progress with chronic hip flexor tightness. She remains shortened through Rt hip flexors but is improving with less pain and increased functional activitiy level. Progressing toward stated goals of therapy and will benefit form continued treatment.    Pt will benefit from skilled therapeutic intervention in order to improve on the following deficits Postural dysfunction;Improper body mechanics;Decreased range of motion;Decreased mobility;Increased fascial restricitons;Decreased endurance;Decreased activity tolerance;Pain   Rehab Potential Good   PT Frequency 2x / week   PT Duration 6 weeks   PT Treatment/Interventions Patient/family education;ADLs/Self Care Home Management;Manual  techniques;Dry needling;Cryotherapy;Electrical Stimulation;Moist Heat;Ultrasound   PT Next Visit Plan Continue manual therapy to Rt adductor/ hip flexor/ piriformis.  Add core series.  Consider dry needling.    PT Home Exercise Plan myofacial release work; stretches for HEP; education re chairs to sit or rest in - to avoid sitting  in soft surfaces in hip flexed position   Consulted and Agree with Plan of Care Patient        Problem List Patient Active Problem List   Diagnosis Date Noted  . Left cervical radiculopathy 10/28/2014  . Degenerative disc disease, cervical 09/10/2013  . Thoracic degenerative disc disease 09/10/2013  . Osteoarthritis of right hip 08/20/2013  . Right lumbar radiculopathy 08/20/2013  . 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 Diabetes mellitus type 2, uncontrolled, without complications ([place]) 41/66/0630  . History of hepatitis C virus infection 08/09/2008  . HYPERTENSION, BENIGN 07/05/2008  . Bariatric surgery status 06/29/2008  . HYPOTHYROIDISM NOS 11/03/2006  . Hyperlipidemia 11/03/2006  . COMMON MIGRAINE 11/03/2006    Celyn Nilda Simmer PT, MPH 12/01/2014, 5:55 PM  [facility] [place], [place], [address] Phone: [phone]   Fax:  [phone]  Name: Pamela Holloway MRN: [id number] Date of Birth: December 25, 1960

## 2014-12-01 NOTE — Patient Instructions (Signed)
Buttocks     Pelvic press  Then lift LE Lying face down, inhale. Keeping leg straight, exhale while lifting leg just off floor. Hold 2 count. Slowly return to starting position. Repeat __10__ times each leg. Do _1-2_ sets per session. Do _1-2___ sessions per day.  Forward Lean (Half-Kneeling)    Kneeling on right leg, slowly lean forward over other leg keeping back up straight (not like this picture), keeping stomach tight. Hold 30 sec Repeat __3__ times per set.  Do __2-3__ sessions per day.

## 2014-12-02 ENCOUNTER — Ambulatory Visit: Payer: BC Managed Care – PPO | Admitting: Sports Medicine

## 2014-12-03 LAB — HM DIABETES EYE EXAM

## 2014-12-05 ENCOUNTER — Encounter: Payer: Self-pay | Admitting: Sports Medicine

## 2014-12-05 ENCOUNTER — Ambulatory Visit (INDEPENDENT_AMBULATORY_CARE_PROVIDER_SITE_OTHER): Payer: BC Managed Care – PPO | Admitting: Sports Medicine

## 2014-12-05 ENCOUNTER — Ambulatory Visit (HOSPITAL_BASED_OUTPATIENT_CLINIC_OR_DEPARTMENT_OTHER): Payer: BC Managed Care – PPO

## 2014-12-05 VITALS — BP 163/82 | HR 88 | Temp 98.2°F | Resp 18 | Wt 205.1 lb

## 2014-12-05 DIAGNOSIS — M1611 Unilateral primary osteoarthritis, right hip: Secondary | ICD-10-CM | POA: Diagnosis not present

## 2014-12-05 DIAGNOSIS — G5621 Lesion of ulnar nerve, right upper limb: Secondary | ICD-10-CM | POA: Insufficient documentation

## 2014-12-05 DIAGNOSIS — M5030 Other cervical disc degeneration, unspecified cervical region: Secondary | ICD-10-CM | POA: Diagnosis not present

## 2014-12-05 NOTE — Assessment & Plan Note (Signed)
Elbow sleeve to encourage elbow extension at night, return if no better in one month and we can do an ulnar nerve hydrodissection at the cubital tunnel.

## 2014-12-05 NOTE — Progress Notes (Signed)
  Subjective:    CC: Follow-up  HPI: Left cervical radiculopathy: C7, resolved with physical therapy  Right hip osteoarthritis: Continues to do well  Right pinky numbness and tingling going worse at night, in the morning, symptoms are moderate, persistent.  Past medical history, Surgical history, Family history not pertinant except as noted below, Social history, Allergies, and medications have been entered into the medical record, reviewed, and no changes needed.   Review of Systems: No fevers, chills, night sweats, weight loss, chest pain, or shortness of breath.   Objective:    General: Well Developed, well nourished, and in no acute distress.  Neuro: Alert and oriented x3, extra-ocular muscles intact, sensation grossly intact.  HEENT: Normocephalic, atraumatic, pupils equal round reactive to light, neck supple, no masses, no lymphadenopathy, thyroid nonpalpable.  Skin: Warm and dry, no rashes. Cardiac: Regular rate and rhythm, no murmurs rubs or gallops, no lower extremity edema.  Respiratory: Clear to auscultation bilaterally. Not using accessory muscles, speaking in full sentences. Right Elbow: Unremarkable to inspection. Range of motion full pronation, supination, flexion, extension. Strength is full to all of the above directions Stable to varus, valgus stress. Negative moving valgus stress test. No discrete areas of tenderness to palpation. Ulnar nerve does not sublux. Positive cubital tunnel Tinel's.  Impression and Recommendations:

## 2014-12-05 NOTE — Assessment & Plan Note (Signed)
Continues to be pain free after injection 3 months ago.

## 2014-12-05 NOTE — Assessment & Plan Note (Signed)
Left cervical radiculopathy, C7 distribution has done very well with formal physical therapy and is essentially resolved.

## 2014-12-08 ENCOUNTER — Ambulatory Visit (INDEPENDENT_AMBULATORY_CARE_PROVIDER_SITE_OTHER): Payer: BC Managed Care – PPO | Admitting: Physical Therapy

## 2014-12-08 DIAGNOSIS — R293 Abnormal posture: Secondary | ICD-10-CM | POA: Diagnosis not present

## 2014-12-08 DIAGNOSIS — Z7409 Other reduced mobility: Secondary | ICD-10-CM | POA: Diagnosis not present

## 2014-12-08 DIAGNOSIS — M25551 Pain in right hip: Secondary | ICD-10-CM | POA: Diagnosis not present

## 2014-12-08 DIAGNOSIS — M2560 Stiffness of unspecified joint, not elsewhere classified: Secondary | ICD-10-CM

## 2014-12-08 DIAGNOSIS — M623 Immobility syndrome (paraplegic): Secondary | ICD-10-CM

## 2014-12-08 NOTE — Therapy (Signed)
[place], [place], [address] Phone: [phone]   Fax:  [phone]  Physical Therapy Treatment  Patient Details  Name: Pamela Holloway MRN: [id number] Date of Birth: 04-11-1960 No Data Recorded  Encounter Date: 12/08/2014      PT End of Session - 12/08/14 1708    Visit Number 6   Number of Visits 12   Date for PT Re-Evaluation 12/15/14   PT Start Time 1700   PT Stop Time 1739   PT Time Calculation (min) 39 min   Activity Tolerance Patient tolerated treatment well;No increased pain      Past Medical History  Diagnosis Date  . Hepatitis C   . Hypertension   . Thyroid disease     Hypothyroid  . Hyperlipidemia   . Diabetes mellitus without complication [facility])     Past Surgical History  Procedure Laterality Date  . Cholecystectomy    . Tonsillectomy    . Laparoscopic gastric banding      There were no vitals filed for this visit.  Visit Diagnosis:  Hip pain, right  Stiffness due to immobility  Abnormal posture  Decreased mobility and endurance      Subjective Assessment - 12/08/14 1706    Subjective Pt reports that her Rt hip has improved a great deal since beginning therapy. Still has pain during transitional movements.  She had pain in hip when walking across parking lot.    Currently in Pain? No/denies            [facility] PT Assessment - 12/08/14 0001    Assessment   Medical Diagnosis Rt hip pain   Onset Date/Surgical Date 09/13/14   Hand Dominance Right   Next MD Visit PRN   Flexibility   Quadriceps heel to buttocks 3 " Rt, 4" Lt           [facility] Adult PT Treatment/Exercise - 12/08/14 0001    Lumbar Exercises: Stretches   Hip Flexor Stretch 5 reps;20 seconds  single high kneel, with pelvic tilt -popping sound   Press Ups 10 seconds;5 reps   Quad Stretch 2 reps;30 seconds  prone with strap, each leg   Lumbar Exercises: Aerobic   Stationary Bike NuStep L4: 6  min   Pt abducts Lt hip for improved tolerance   Lumbar Exercises: Supine   Other Supine Lumbar Exercises Rt adductor stretch (butterfly) x 1 min x 3 reps.    Lumbar Exercises: Prone   Straight Leg Raise 10 reps;5 seconds  each leg   Manual Therapy   Manual Therapy Myofascial release   Manual therapy comments pt hooklying with Rt leg slightly abducted.   Pt not as tender with work to Rt psoas   Soft tissue mobilization gentle rocking with ITB soft tissue mobilization   Myofascial Release to Rt adductor group distally to proximally, medial hamstring, Rt psoas          PT Long Term Goals - 12/01/14 1753    PT LONG TERM GOAL #1   Title Patient I in HEP 12/15/14   Time 6   Period Weeks   Status On-going   PT LONG TERM GOAL #2   Title Decrease muscular tightness Rt hip allowing improved hip ROM 12/15/14   Baseline progressing well    Time 6   Period Weeks   Status On-going   PT LONG TERM GOAL #3   Title Improve posture and alignment with standing and walking 12/15/14  Baseline improved standing posture and gait pattern   Time 6   Period Weeks   Status On-going   PT LONG TERM GOAL #4   Title Patient to tolerant siting/standing/walking > 30-60 min with min to no pain 12/15/14   Time 6   Period Weeks   Status On-going   PT LONG TERM GOAL #5   Title Improve FOTO to </= 25% limitation 12/15/14   Time 6   Period Weeks   Status On-going               Plan - 12/08/14 1744    Clinical Impression Statement Pt tolerated all exercises without increase in pain.  Noted pt continues with tightness in Rt hip adductors with manual therapy; point tender midbelly to proximal adductors.  Pt reported improved hip mobility with less pain after manual therapy.  Progressing well towards goals.   Held modalities due to 0/10 pain level at end of session; will use heat and TENS at home tonight.    Pt will benefit from skilled therapeutic intervention in order to improve on the following  deficits Postural dysfunction;Improper body mechanics;Decreased range of motion;Decreased mobility;Increased fascial restricitons;Decreased endurance;Decreased activity tolerance;Pain   Rehab Potential Good   PT Frequency 2x / week   PT Duration 6 weeks   PT Treatment/Interventions Patient/family education;ADLs/Self Care Home Management;Manual techniques;Dry needling;Cryotherapy;Electrical Stimulation;Moist Heat;Ultrasound   PT Next Visit Plan Dry needling. Continue manual therapy to Rt adductor/ hip flexor/ piriformis.  Add core series.     Consulted and Agree with Plan of Care Patient        Problem List Patient Active Problem List   Diagnosis Date Noted  . Cubital tunnel syndrome on right 12/05/2014  . Left cervical radiculopathy 10/28/2014  . Degenerative disc disease, cervical 09/10/2013  . Thoracic degenerative disc disease 09/10/2013  . Osteoarthritis of right hip 08/20/2013  . Right lumbar radiculopathy 08/20/2013  . 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 Diabetes mellitus type 2, uncontrolled, without complications ([place]) [id number]  . History of hepatitis C virus infection 08/09/2008  . HYPERTENSION, BENIGN 07/05/2008  . Bariatric surgery status 06/29/2008  . HYPOTHYROIDISM NOS 11/03/2006  . Hyperlipidemia 11/03/2006  . COMMON MIGRAINE 11/03/2006   [name], PTA 12/08/2014 5:58 PM  [place], [place], [address] Phone: [phone]   Fax:  [phone]  Name: Pamela Holloway MRN: [id number] Date of Birth: [birth year]

## 2014-12-15 ENCOUNTER — Ambulatory Visit (INDEPENDENT_AMBULATORY_CARE_PROVIDER_SITE_OTHER): Payer: BC Managed Care – PPO | Admitting: Physical Therapy

## 2014-12-15 ENCOUNTER — Encounter: Payer: Self-pay | Admitting: Physical Therapy

## 2014-12-15 DIAGNOSIS — M2560 Stiffness of unspecified joint, not elsewhere classified: Secondary | ICD-10-CM

## 2014-12-15 DIAGNOSIS — Z7409 Other reduced mobility: Secondary | ICD-10-CM

## 2014-12-15 DIAGNOSIS — M623 Immobility syndrome (paraplegic): Secondary | ICD-10-CM

## 2014-12-15 DIAGNOSIS — M25551 Pain in right hip: Secondary | ICD-10-CM | POA: Diagnosis not present

## 2014-12-15 NOTE — Patient Instructions (Signed)

## 2014-12-15 NOTE — Therapy (Signed)
[place], [place], [address] Phone: [phone]   Fax:  [phone]  Physical Therapy Treatment  Patient Details  Name: Pamela Holloway MRN: [id number] Date of Birth: 1960-08-27 No Data Recorded  Encounter Date: 12/15/2014      PT End of Session - 12/15/14 1753    Visit Number 7   Number of Visits 12   PT Start Time 4656   PT Stop Time 1800   PT Time Calculation (min) 55 min   Activity Tolerance Patient tolerated treatment well      Past Medical History  Diagnosis Date  . Hepatitis C   . Hypertension   . Thyroid disease     Hypothyroid  . Hyperlipidemia   . Diabetes mellitus without complication [facility])     Past Surgical History  Procedure Laterality Date  . Cholecystectomy    . Tonsillectomy    . Laparoscopic gastric banding      There were no vitals filed for this visit.  Visit Diagnosis:  Hip pain, right  Stiffness due to immobility                       [facility] Adult PT Treatment/Exercise - 12/15/14 0001    Modalities   Modalities Electrical Stimulation;Moist Heat   Moist Heat Therapy   Number Minutes Moist Heat 15 Minutes   Moist Heat Location --  Rt gluts, ant hip and adductors   Electrical Stimulation   Electrical Stimulation Location R adductors and ant/post hip   Electrical Stimulation Action premod   Electrical Stimulation Parameters to tolerance   Electrical Stimulation Goals Pain;Tone   Manual Therapy   Soft tissue mobilization Rt glut med, piriformis, hip adductors STW with TPR           Trigger Point Dry Needling - 12/15/14 1718    Consent Given? Yes   Education Handout Provided Yes   Muscles Treated Lower Body Piriformis;Adductor longus/brevius/maximus;Gluteus maximus   Gluteus Maximus Response Twitch response elicited;Palpable increased muscle length   Piriformis Response Twitch response elicited;Palpable increased muscle length   Adductor Response Twitch response elicited;Palpable increased muscle length              PT Education - 12/15/14 1706    Education provided Yes   Education Details TDN   Person(s) Educated Patient   Methods Explanation;Handout   Comprehension Verbalized understanding             PT Long Term Goals - 12/01/14 1753    PT LONG TERM GOAL #1   Title Patient I in HEP 12/15/14   Time 6   Period Weeks   Status On-going   PT LONG TERM GOAL #2   Title Decrease muscular tightness Rt hip allowing improved hip ROM 12/15/14   Baseline progressing well    Time 6   Period Weeks   Status On-going   PT LONG TERM GOAL #3   Title Improve posture and alignment with standing and walking 12/15/14   Baseline improved standing posture and gait pattern   Time 6   Period Weeks   Status On-going   PT LONG TERM GOAL #4   Title Patient to tolerant siting/standing/walking > 30-60 min with min to no pain 12/15/14   Time 6   Period Weeks   Status On-going   PT LONG TERM GOAL #5   Title Improve FOTO to </= 25% limitation 12/15/14   Time 6  Period Weeks   Status On-going               Plan - 12/15/14 1754    Clinical Impression Statement Pt tolerated TDN and manual therapy well, Had some good releases with treatment. No new goals met at this time.    Pt will benefit from skilled therapeutic intervention in order to improve on the following deficits Postural dysfunction;Improper body mechanics;Decreased range of motion;Decreased mobility;Increased fascial restricitons;Decreased endurance;Decreased activity tolerance;Pain   Rehab Potential Good   PT Frequency 2x / week   PT Duration 6 weeks   PT Treatment/Interventions Patient/family education;ADLs/Self Care Home Management;Manual techniques;Dry needling;Cryotherapy;Electrical Stimulation;Moist Heat;Ultrasound   PT Next Visit Plan reassess and write renewal. Will be in two weeks as she needs late appointments and we will not be open  next Thursday.    Consulted and Agree with Plan of Care Patient        Problem List Patient Active Problem List   Diagnosis Date Noted  . Cubital tunnel syndrome on right 12/05/2014  . Left cervical radiculopathy 10/28/2014  . Degenerative disc disease, cervical 09/10/2013  . Thoracic degenerative disc disease 09/10/2013  . Osteoarthritis of right hip 08/20/2013  . Right lumbar radiculopathy 08/20/2013  . 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 Diabetes mellitus type 2, uncontrolled, without complications ([place]) 95/36/9223  . History of hepatitis C virus infection 08/09/2008  . HYPERTENSION, BENIGN 07/05/2008  . Bariatric surgery status 06/29/2008  . HYPOTHYROIDISM NOS 11/03/2006  . Hyperlipidemia 11/03/2006  . COMMON MIGRAINE 11/03/2006    Jeral Pinch PT 12/15/2014, 5:56 PM  [facility] [place], [place], [address] Phone: [phone]   Fax:  [phone]  Name: JAQUIA Holloway MRN: [id number] Date of Birth: [birth year]

## 2014-12-28 ENCOUNTER — Other Ambulatory Visit: Payer: Self-pay | Admitting: Sports Medicine

## 2014-12-29 ENCOUNTER — Encounter: Payer: Self-pay | Admitting: Physical Therapy

## 2014-12-29 ENCOUNTER — Ambulatory Visit (INDEPENDENT_AMBULATORY_CARE_PROVIDER_SITE_OTHER): Payer: BC Managed Care – PPO | Admitting: Physical Therapy

## 2014-12-29 DIAGNOSIS — M25551 Pain in right hip: Secondary | ICD-10-CM | POA: Diagnosis not present

## 2014-12-29 DIAGNOSIS — M623 Immobility syndrome (paraplegic): Secondary | ICD-10-CM

## 2014-12-29 DIAGNOSIS — M2560 Stiffness of unspecified joint, not elsewhere classified: Secondary | ICD-10-CM

## 2014-12-29 DIAGNOSIS — Z7409 Other reduced mobility: Secondary | ICD-10-CM

## 2014-12-29 NOTE — Therapy (Signed)
[place], [place], [address] Phone: [phone]   Fax:  [phone]  Physical Therapy Treatment  Patient Details  Name: Pamela Holloway MRN: [id number] Date of Birth: [birth year] No Data Recorded  Encounter Date: 12/29/2014      PT End of Session - 12/29/14 1710    Visit Number 8   Number of Visits 12   Date for PT Re-Evaluation 02/09/15   PT Start Time [id number]   PT Stop Time 1804   PT Time Calculation (min) 54 min      Past Medical History  Diagnosis Date  . Hepatitis C   . Hypertension   . Thyroid disease     Hypothyroid  . Hyperlipidemia   . Diabetes mellitus without complication [facility])     Past Surgical History  Procedure Laterality Date  . Cholecystectomy    . Tonsillectomy    . Laparoscopic gastric banding      There were no vitals filed for this visit.  Visit Diagnosis:  Hip pain, right - Plan: PT plan of care cert/re-cert  Stiffness due to immobility - Plan: PT plan of care cert/re-cert  Decreased mobility and endurance - Plan: PT plan of care cert/re-cert      Subjective Assessment - 12/29/14 1712    Subjective Pt reports she was feeling really good until Saturday night after driving back from [place].  She feels like it needs to be needled.    Currently in Pain? Yes   Pain Score 3    Pain Location Hip   Pain Orientation Right;Anterior   Pain Descriptors / Indicators Sharp   Pain Type Chronic pain   Aggravating Factors  any activity with lifting the Rt LE, up stairs, in/out of car, lifting leg to shave.    Pain Relieving Factors nothing.             [facility] PT Assessment - 12/29/14 0001    Assessment   Medical Diagnosis Rt hip pain   Onset Date/Surgical Date 09/13/14   Hand Dominance Right   Next MD Visit PRN   Observation/Other Assessments   Focus on Therapeutic Outcomes (FOTO)  35% limited   Sensation   Additional Comments very tight and tender around the Rt  hip complex into the hip flexors with radiating symptoms to her knee.    AROM   Overall AROM Comments Hips WNL passively , Rt hip limted due to pain.    Flexibility   ITB tight bilat   Piriformis tight bilatpain in Rt groin with testing Rt    Palpation   Palpation comment Significant tightness Rt hip flexors at iliopsoas; anterior quads; sartorius; hip adductors; piriformis; hip abductors                     [facility] Adult PT Treatment/Exercise - 12/29/14 0001    Lumbar Exercises: Aerobic   Stationary Bike NuStep L4: 6 min    Modalities   Modalities Moist Heat   Moist Heat Therapy   Number Minutes Moist Heat 15 Minutes   Moist Heat Location --  low back, adductors Rt and upper quads Rt.    Manual Therapy   Manual Therapy Myofascial release   Manual therapy comments pt hooklying with Rt leg slightly abducted.   Pt not as tender with work to Rt psoas   Soft tissue mobilization Rt glut med, piriformis, hip adductors STW with TPR    Myofascial Release to Rt adductor group  distally to proximally, medial hamstring, Rt psoas          Trigger Point Dry Needling - 12/29/14 1754    Consent Given? Yes   Education Handout Provided No   Muscles Treated Lower Body Quadriceps;Adductor longus/brevius/maximus;Gluteus maximus   Gluteus Maximus Response Palpable increased muscle length   Piriformis Response Palpable increased muscle length   Quadriceps Response Twitch response elicited;Palpable increased muscle length   Adductor Response Twitch response elicited;Palpable increased muscle length                   PT Long Term Goals - 12/29/14 1809    PT LONG TERM GOAL #1   Title Patient I in HEP 02/09/15   Time 6   Status On-going   PT LONG TERM GOAL #2   Title Decrease muscular tightness Rt hip allowing improved hip ROM 02/09/15   Time 6   Period Days   Status On-going   PT LONG TERM GOAL #3   Title Improve posture and alignment with standing and walking 02/09/15    Time 6   Period Weeks   Status On-going   PT LONG TERM GOAL #5   Title Improve FOTO to </= 25% limitation 02/09/15   Time 6   Period Weeks   Status On-going  scored 35% limited, same as at initial eval.                Plan - 12/29/14 1806    Clinical Impression Statement Pt was doing well after last visit then had a set back after a long car ride.  She needs core stabilization once she is out of her pain flare ups.  Her progress is limited as she can only come once a week due to her work schedule. She continues to have Rt  radicular sympotms. she states her neck is bothering her a lot now and wishes to have this evaluated.  She does have an order for this. Renewal sent to continue work on her back.    Pt will benefit from skilled therapeutic intervention in order to improve on the following deficits Postural dysfunction;Improper body mechanics;Decreased range of motion;Decreased mobility;Increased fascial restricitons;Decreased endurance;Decreased activity tolerance;Pain   Rehab Potential Good   PT Frequency 1x / week   PT Duration 6 weeks   PT Treatment/Interventions Patient/family education;ADLs/Self Care Home Management;Manual techniques;Dry needling;Cryotherapy;Electrical Stimulation;Moist Heat;Ultrasound   PT Next Visit Plan assess her neck per order   Consulted and Agree with Plan of Care Patient        Problem List Patient Active Problem List   Diagnosis Date Noted  . Cubital tunnel syndrome on right 12/05/2014  . Left cervical radiculopathy 10/28/2014  . Degenerative disc disease, cervical 09/10/2013  . Thoracic degenerative disc disease 09/10/2013  . Osteoarthritis of right hip 08/20/2013  . Right lumbar radiculopathy 08/20/2013  . 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 Diabetes mellitus type 2, uncontrolled, without complications ([place]) [id number]  . History of hepatitis C virus infection 08/09/2008  . HYPERTENSION, BENIGN 07/05/2008  . Bariatric surgery status  06/29/2008  . HYPOTHYROIDISM NOS 11/03/2006  . Hyperlipidemia 11/03/2006  . COMMON MIGRAINE 11/03/2006    Jeral Pinch PT 12/29/2014, 6:14 PM  [facility] [place], [place], [address] Phone: [phone]   Fax:  [phone]  Name: Pamela Holloway MRN: [id number] Date of Birth: [birth year]

## 2015-01-12 ENCOUNTER — Ambulatory Visit (INDEPENDENT_AMBULATORY_CARE_PROVIDER_SITE_OTHER): Payer: BC Managed Care – PPO | Admitting: Physical Therapy

## 2015-01-12 DIAGNOSIS — M25551 Pain in right hip: Secondary | ICD-10-CM | POA: Diagnosis not present

## 2015-01-12 DIAGNOSIS — M623 Immobility syndrome (paraplegic): Secondary | ICD-10-CM | POA: Diagnosis not present

## 2015-01-12 DIAGNOSIS — M2560 Stiffness of unspecified joint, not elsewhere classified: Secondary | ICD-10-CM

## 2015-01-12 DIAGNOSIS — Z7409 Other reduced mobility: Secondary | ICD-10-CM | POA: Diagnosis not present

## 2015-01-12 NOTE — Patient Instructions (Addendum)
Pelvic Press    Place hands under belly between navel and pubic bone, palms up. Feel pressure on hands. Increase pressure on hands by pressing pelvis down. This is NOT a pelvic tilt. Hold _1__ seconds. Relax. Repeat _10__ times. Once a day  Leg Lift: One-Leg    Press pelvis down. Keep knee straight; lengthen and lift one leg (from waist). Do not twist body. Keep other leg down. Hold _1__ seconds. Relax. Repeat 10 time. Repeat with other leg. Once a day  Shoulder Blade Squeeze: Airplane    Arms out to sides at 90, elbows straight, palms down. Press pelvis down. Squeeze backbone with shoulder blades. Raise arms, front of shoulders, chest, and head. Keep neck neutral. Hold _1__ seconds. Relax. Repeat _10__ times. Once a day  Shoulder Blade Squeeze: W    Arms out to sides at 90 palms down. Bend elbows to 90. Press pelvis down. Squeeze backbone with shoulder blades. Raise arms, front of shoulders, chest, and head. Keep neck neutral. Hold _1__ seconds. Relax. Repeat _10__ times. Once a day Copyright  VHI. All rights reserved.

## 2015-01-12 NOTE — Therapy (Signed)
[place], [place], [address] Phone: [phone]   Fax:  [phone]  Physical Therapy Treatment  Patient Details  Name: Pamela Holloway MRN: [id number] Date of Birth: [birth year] No Data Recorded  Encounter Date: 01/12/2015      PT End of Session - 01/12/15 1752    Visit Number 9   Number of Visits 12   Date for PT Re-Evaluation 02/09/15   PT Start Time 4742   PT Stop Time 1801   PT Time Calculation (min) 56 min   Activity Tolerance Patient tolerated treatment well      Past Medical History  Diagnosis Date  . Hepatitis C   . Hypertension   . Thyroid disease     Hypothyroid  . Hyperlipidemia   . Diabetes mellitus without complication [facility])     Past Surgical History  Procedure Laterality Date  . Cholecystectomy    . Tonsillectomy    . Laparoscopic gastric banding      There were no vitals filed for this visit.  Visit Diagnosis:  Hip pain, right  Stiffness due to immobility  Decreased mobility and endurance      Subjective Assessment - 01/12/15 1709    Subjective Pt reports she was really sore for about 3 days then had minimal pain for a about 10 days.  The pain returned with a vengance about 5 days ago. This has been a really bad week. Performin a lot of deep pressure release. Did a lot of wrapping of presents on Sat in FWD bend position. Started going to [employer] using nustep like device   Patient Stated Goals get out of pain and not have the pain come back   Currently in Pain? Yes   Pain Location --  thigh   Pain Orientation Right;Anterior   Pain Type Chronic pain                         [facility] Adult PT Treatment/Exercise - 01/12/15 0001    Lumbar Exercises: Stretches   ITB Stretch 30 seconds  cross body    Lumbar Exercises: Prone   Other Prone Lumbar Exercises pelvic press, then with hip ext   Other Prone Lumbar Exercises upper body lifts T & W     Modalities   Modalities Moist Heat   Moist Heat Therapy   Number Minutes Moist Heat 15 Minutes   Moist Heat Location Lumbar Spine  Rt thigh   Manual Therapy   Manual Therapy Soft tissue mobilization;Myofascial release   Soft tissue mobilization Rt thigh - quad, lateral HS QL   Myofascial Release Rt QL TPR          Trigger Point Dry Needling - 01/12/15 1747    Consent Given? Yes   Education Handout Provided No   Muscles Treated Lower Body Quadriceps;Hamstring  vastus lateralis   Quadriceps Response Twitch response elicited;Palpable increased muscle length   Hamstring Response Twitch response elicited;Palpable increased muscle length                   PT Long Term Goals - 12/29/14 1809    PT LONG TERM GOAL #1   Title Patient I in HEP 02/09/15   Time 6   Status On-going   PT LONG TERM GOAL #2   Title Decrease muscular tightness Rt hip allowing improved hip ROM 02/09/15   Time 6   Period Days   Status On-going  PT LONG TERM GOAL #3   Title Improve posture and alignment with standing and walking 02/09/15   Time 6   Period Weeks   Status On-going   PT LONG TERM GOAL #5   Title Improve FOTO to </= 25% limitation 02/09/15   Time 6   Period Weeks   Status On-going  scored 35% limited, same as at initial eval.                Plan - 01/12/15 1750    Clinical Impression Statement Pt had very similiar cycle of relief and return of pain.  Had good decrease in pain with TDN and manual work.  She needs to focus on core stability exercise at home especially when she is feeling well. No new goals met as she is only able to attend 5pm appontments and  hasn't been able to get in for about 3 wks.    Pt will benefit from skilled therapeutic intervention in order to improve on the following deficits Postural dysfunction;Improper body mechanics;Decreased range of motion;Decreased mobility;Increased fascial restricitons;Decreased endurance;Decreased activity tolerance;Pain    Rehab Potential Good   PT Frequency 1x / week   PT Duration 6 weeks   PT Treatment/Interventions Patient/family education;ADLs/Self Care Home Management;Manual techniques;Dry needling;Cryotherapy;Electrical Stimulation;Moist Heat;Ultrasound   PT Next Visit Plan assess her neck per order   Consulted and Agree with Plan of Care Patient        Problem List Patient Active Problem List   Diagnosis Date Noted  . Cubital tunnel syndrome on right 12/05/2014  . Left cervical radiculopathy 10/28/2014  . Degenerative disc disease, cervical 09/10/2013  . Thoracic degenerative disc disease 09/10/2013  . Osteoarthritis of right hip 08/20/2013  . Right lumbar radiculopathy 08/20/2013  . 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 Diabetes mellitus type 2, uncontrolled, without complications ([place]) 35/59/7416  . History of hepatitis C virus infection 08/09/2008  . HYPERTENSION, BENIGN 07/05/2008  . Bariatric surgery status 06/29/2008  . HYPOTHYROIDISM NOS 11/03/2006  . Hyperlipidemia 11/03/2006  . COMMON MIGRAINE 11/03/2006    Jeral Pinch PT 01/12/2015, 5:54 PM  [facility] [place], [place], [address] Phone: [phone]   Fax:  [phone]  Name: Pamela Holloway MRN: [id number] Date of Birth: April 27, 1960

## 2015-01-19 ENCOUNTER — Ambulatory Visit (INDEPENDENT_AMBULATORY_CARE_PROVIDER_SITE_OTHER): Payer: BC Managed Care – PPO | Admitting: Physical Therapy

## 2015-01-19 DIAGNOSIS — Z7409 Other reduced mobility: Secondary | ICD-10-CM

## 2015-01-19 DIAGNOSIS — M25551 Pain in right hip: Secondary | ICD-10-CM | POA: Diagnosis not present

## 2015-01-19 DIAGNOSIS — R293 Abnormal posture: Secondary | ICD-10-CM

## 2015-01-19 DIAGNOSIS — M623 Immobility syndrome (paraplegic): Secondary | ICD-10-CM

## 2015-01-19 DIAGNOSIS — M2560 Stiffness of unspecified joint, not elsewhere classified: Secondary | ICD-10-CM

## 2015-01-19 NOTE — Patient Instructions (Signed)
On Elbows (Prone)    Rise up on elbows as high as possible, keeping hips on floor. Hold _60-120___ seconds. Repeat _1___ times per set. Do __1__ sets per session. Do _2-3___ sessions per day.    Press-Up    Press upper body upward, keeping hips in contact with floor. Keep lower back and buttocks relaxed. Hold __1__ seconds. Repeat _10___ times per set. Do _1__ sets per session. Do __2-3__ sessions per day.  Copyright  VHI. All rights reserved.

## 2015-01-19 NOTE — Therapy (Signed)
[facility] [place], [place], [address] Phone: [phone]   Fax:  [phone]  Physical Therapy Treatment  Patient Details  Name: Pamela Holloway MRN: [id number] Date of Birth: 11-01-1960 No Data Recorded  Encounter Date: 01/19/2015      PT End of Session - 01/19/15 1700    Visit Number 10   Number of Visits 12   Date for PT Re-Evaluation 02/09/15   PT Start Time [id number]      Past Medical History  Diagnosis Date  . Hepatitis C   . Hypertension   . Thyroid disease     Hypothyroid  . Hyperlipidemia   . Diabetes mellitus without complication [facility])     Past Surgical History  Procedure Laterality Date  . Cholecystectomy    . Tonsillectomy    . Laparoscopic gastric banding      There were no vitals filed for this visit.  Visit Diagnosis:  Hip pain, right  Stiffness due to immobility  Abnormal posture      Subjective Assessment - 01/19/15 1655    Subjective Pt reports she was very sore everyday after the last treatment until she had her husband massage and now she feels so much better.    Pain Score 2    Pain Location Groin   Pain Orientation Right;Anterior   Pain Descriptors / Indicators Aching;Sharp   Pain Type Chronic pain   Aggravating Factors  After last visit    Pain Relieving Factors massage                         [facility] Adult PT Treatment/Exercise - 01/19/15 0001    Lumbar Exercises: Supine   Ab Set 10 reps   Clam 10 reps  wiht TA contraction, has burning  to her Rt knee   Lumbar Exercises: Prone   Other Prone Lumbar Exercises POE, then press ups.    Modalities   Modalities Electrical Stimulation;Moist Heat   Moist Heat Therapy   Number Minutes Moist Heat 15 Minutes   Moist Heat Location --  anterior Rt thigh   Electrical Stimulation   Electrical Stimulation Location Rt hip adductors and quad   Electrical Stimulation Action with TDN   Electrical  Stimulation Parameters to tolerance   Electrical Stimulation Goals Tone;Pain   Manual Therapy   Manual Therapy Passive ROM;Soft tissue mobilization   Soft tissue mobilization Rt thigh quad and adductors   Passive ROM AA stretch Rt quad prone. POE and with press up,S/L quad and HS stretch with straps.           Trigger Point Dry Needling - 01/19/15 1729    Consent Given? Yes   Education Handout Provided No   Muscles Treated Lower Body Quadriceps;Adductor longus/brevius/maximus   Quadriceps Response Twitch response elicited;Palpable increased muscle length  hooked to e-stim   Adductor Response Twitch response elicited;Palpable increased muscle length  hooked to stim               PT Education - 01/19/15 1707    Education provided Yes   Education Details POE   Person(s) Educated Patient   Methods Demonstration;Handout   Comprehension Returned demonstration             PT Long Term Goals - 12/29/14 1809    PT LONG TERM GOAL #1   Title Patient I in HEP 02/09/15   Time 6   Status On-going   PT LONG  TERM GOAL #2   Title Decrease muscular tightness Rt hip allowing improved hip ROM 02/09/15   Time 6   Period Days   Status On-going   PT LONG TERM GOAL #3   Title Improve posture and alignment with standing and walking 02/09/15   Time 6   Period Weeks   Status On-going   PT LONG TERM GOAL #5   Title Improve FOTO to </= 25% limitation 02/09/15   Time 6   Period Weeks   Status On-going  scored 35% limited, same as at initial eval.                Plan - 01/19/15 1757    Clinical Impression Statement Pt had significant pain in her Rt groin and anterior thigh after the last treatment.  She did get relief with massage.  This treatment we focused on the Rt quad and had good relief at the end.  We will see how she does over the week.    Pt will benefit from skilled therapeutic intervention in order to improve on the following deficits Postural dysfunction;Improper  body mechanics;Decreased range of motion;Decreased mobility;Increased fascial restricitons;Decreased endurance;Decreased activity tolerance;Pain   Rehab Potential Good   PT Frequency 1x / week   PT Duration 6 weeks   PT Treatment/Interventions Patient/family education;ADLs/Self Care Home Management;Manual techniques;Dry needling;Cryotherapy;Electrical Stimulation;Moist Heat;Ultrasound   PT Next Visit Plan assess her neck per order - when she is ready or continue to treat her back/groin   Consulted and Agree with Plan of Care Patient        Problem List Patient Active Problem List   Diagnosis Date Noted  . Cubital tunnel syndrome on right 12/05/2014  . Left cervical radiculopathy 10/28/2014  . Degenerative disc disease, cervical 09/10/2013  . Thoracic degenerative disc disease 09/10/2013  . Osteoarthritis of right hip 08/20/2013  . Right lumbar radiculopathy 08/20/2013  . 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 Diabetes mellitus type 2, uncontrolled, without complications ([place]) [id number]  . History of hepatitis C virus infection 08/09/2008  . HYPERTENSION, BENIGN 07/05/2008  . Bariatric surgery status 06/29/2008  . HYPOTHYROIDISM NOS 11/03/2006  . Hyperlipidemia 11/03/2006  . COMMON MIGRAINE 11/03/2006    Jeral Pinch PT 01/19/2015, [place], [place], [address] Phone: [phone]   Fax:  [phone]  Name: Pamela Holloway MRN: [id number] Date of Birth: January 15, 1961

## 2015-01-21 ENCOUNTER — Other Ambulatory Visit: Payer: Self-pay | Admitting: Family Medicine

## 2015-01-26 ENCOUNTER — Encounter: Payer: BC Managed Care – PPO | Admitting: Physical Therapy

## 2015-02-02 ENCOUNTER — Encounter: Payer: Self-pay | Admitting: Physical Therapy

## 2015-02-02 ENCOUNTER — Ambulatory Visit (INDEPENDENT_AMBULATORY_CARE_PROVIDER_SITE_OTHER): Payer: BC Managed Care – PPO | Admitting: Physical Therapy

## 2015-02-02 DIAGNOSIS — M25551 Pain in right hip: Secondary | ICD-10-CM | POA: Diagnosis not present

## 2015-02-02 DIAGNOSIS — R293 Abnormal posture: Secondary | ICD-10-CM

## 2015-02-02 DIAGNOSIS — M623 Immobility syndrome (paraplegic): Secondary | ICD-10-CM | POA: Diagnosis not present

## 2015-02-02 DIAGNOSIS — Z7409 Other reduced mobility: Secondary | ICD-10-CM | POA: Diagnosis not present

## 2015-02-02 DIAGNOSIS — M2560 Stiffness of unspecified joint, not elsewhere classified: Secondary | ICD-10-CM

## 2015-02-02 NOTE — Therapy (Signed)
[facility] [place], [place], [address] Phone: [phone]   Fax:  [phone]  Physical Therapy Treatment  Patient Details  Name: JASLYNNE DAHAN MRN: [id number] Date of Birth: May 24, 1960 No Data Recorded  Encounter Date: 02/02/2015      PT End of Session - 02/02/15 1653    Visit Number 11   Number of Visits 12   Date for PT Re-Evaluation 02/09/15   PT Start Time 4627   PT Stop Time 1751   PT Time Calculation (min) 57 min   Activity Tolerance Patient limited by pain  in her Rt hip      Past Medical History  Diagnosis Date  . Hepatitis C   . Hypertension   . Thyroid disease     Hypothyroid  . Hyperlipidemia   . Diabetes mellitus without complication [facility])     Past Surgical History  Procedure Laterality Date  . Cholecystectomy    . Tonsillectomy    . Laparoscopic gastric banding      There were no vitals filed for this visit.  Visit Diagnosis:  Hip pain, right  Stiffness due to immobility  Abnormal posture  Decreased mobility and endurance      Subjective Assessment - 02/02/15 1658    Subjective Pt states she only has some pain in the Rt groin and upper thigh. she has been performing her HEP.  [place] used heat, rested and has been feeling well.    Currently in Pain? Yes   Pain Score 1    Pain Location Groin   Pain Orientation Right;Anterior   Pain Descriptors / Indicators Dull   Pain Type Chronic pain   Pain Relieving Factors heat, massage                         [facility] Adult PT Treatment/Exercise - 02/02/15 0001    Lumbar Exercises: Supine   Ab Set 10 reps;5 seconds   Clam 10 reps  wiht TA    Heel Slides 10 reps  with TA contractions   Bent Knee Raise 10 reps  with TA contractions   Other Supine Lumbar Exercises windshield with legs in hooklying. Very difficult with Rt hip due to limited ROM   Modalities   Modalities  Electrical Stimulation;Moist Heat   Moist Heat Therapy   Number Minutes Moist Heat 15 Minutes   Moist Heat Location Lumbar Spine;Hip  anterior Rt    Electrical Stimulation   Electrical Stimulation Location all around Rt hip anterior to posterior   Electrical Stimulation Action IFC   Electrical Stimulation Parameters to tolerance   Electrical Stimulation Goals Pain   Manual Therapy   Manual Therapy Passive ROM;Manual Traction;Joint mobilization   Joint Mobilization grade II+ post Rt hip mobs   Passive ROM Rt hip ER stretches   Manual Traction long leg with abduction                PT Education - 02/02/15 1710    Education provided Yes   Education Details HEP TA work   [employer]) Educated Patient   Methods Demonstration;Handout   Comprehension Returned demonstration             PT Long Term Goals - 02/02/15 1712    PT LONG TERM GOAL #1   Title Patient I in HEP 02/09/15   Status Achieved   PT LONG TERM GOAL #2  Title Decrease muscular tightness Rt hip allowing improved hip ROM 02/09/15   Status On-going   PT LONG TERM GOAL #3   Title Improve posture and alignment with standing and walking 02/09/15   Status Achieved   PT LONG TERM GOAL #4   Title Patient to tolerant siting/standing/walking > 30-60 min with min to no pain 12/15/14   Status Achieved   PT LONG TERM GOAL #5   Title Improve FOTO to </= 25% limitation 02/09/15   Status On-going               Plan - 02/02/15 1741    Clinical Impression Statement Patient is doing much better, has met a couple goals and progressing to the others.  Pain and symptoms are mainly into the Rt groin, she has some catching in the hip joint with ROM along with the pain.  Possible hip eitology as back symptoms have resolved.    Pt will benefit from skilled therapeutic intervention in order to improve on the following deficits Postural dysfunction;Improper body mechanics;Decreased range of motion;Decreased mobility;Increased  fascial restricitons;Decreased endurance;Decreased activity tolerance;Pain   Rehab Potential Good   PT Frequency 1x / week   PT Duration 6 weeks   PT Treatment/Interventions Patient/family education;ADLs/Self Care Home Management;Manual techniques;Dry needling;Cryotherapy;Electrical Stimulation;Moist Heat;Ultrasound   PT Next Visit Plan plan for D/C to HEP and return to MD for further hip work up if still catching   Consulted and Agree with Plan of Care Patient        Problem List Patient Active Problem List   Diagnosis Date Noted  . Cubital tunnel syndrome on right 12/05/2014  . Left cervical radiculopathy 10/28/2014  . Degenerative disc disease, cervical 09/10/2013  . Thoracic degenerative disc disease 09/10/2013  . Osteoarthritis of right hip 08/20/2013  . Right lumbar radiculopathy 08/20/2013  . 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 Diabetes mellitus type 2, uncontrolled, without complications ([place]) 82/57/4935  . History of hepatitis C virus infection 08/09/2008  . HYPERTENSION, BENIGN 07/05/2008  . Bariatric surgery status 06/29/2008  . HYPOTHYROIDISM NOS 11/03/2006  . Hyperlipidemia 11/03/2006  . COMMON MIGRAINE 11/03/2006    Jeral Pinch PT 02/02/2015, 5:43 PM  [facility] [place], [place], [address] Phone: [phone]   Fax:  [phone]  Name: APPLE DEARMAS MRN: [id number] Date of Birth: Jul 10, 1960

## 2015-02-02 NOTE — Patient Instructions (Signed)
  Abdominal Bracing With Pelvic Floor (Hook-Lying)   With neutral spine, tighten pelvic floor and abdominals. Hold 10 seconds. Repeat __10_ times. Do _1__ times a day.   Knee to Chest: Transverse Plane Stability   Bring one knee up, then return. Be sure pelvis does not roll side to side. Keep pelvis still. Lift knee __10_ times each leg. Restabilize pelvis. Repeat with other leg. Do _1-2__ sets, _1__ times per day.   Hip External Rotation With Pillow: Transverse Plane Stability   One knee bent, one leg straight, on pillow. Slowly roll bent knee out. Be sure pelvis does not rotate. Do _10__ times. Restabilize pelvis. Repeat with other leg. Do _1-2__ sets, _1__ times per day.  Heel Slide: 4-10 Inches - Transverse Plane Stability   Slide heel 4 inches down. Be sure pelvis does not rotate. Do _10__ times. Restabilize pelvis. Repeat with other leg. Do __1_ sets, _1__ times per day.   [facility] [address] [place], [place] [address]  [phone] (office) [phone] (fax)   

## 2015-02-09 ENCOUNTER — Ambulatory Visit (INDEPENDENT_AMBULATORY_CARE_PROVIDER_SITE_OTHER): Payer: BC Managed Care – PPO | Admitting: Rehabilitative and Restorative Service Providers"

## 2015-02-09 ENCOUNTER — Encounter: Payer: Self-pay | Admitting: Rehabilitative and Restorative Service Providers"

## 2015-02-09 DIAGNOSIS — R293 Abnormal posture: Secondary | ICD-10-CM

## 2015-02-09 DIAGNOSIS — Z7409 Other reduced mobility: Secondary | ICD-10-CM

## 2015-02-09 DIAGNOSIS — M25551 Pain in right hip: Secondary | ICD-10-CM

## 2015-02-09 DIAGNOSIS — M623 Immobility syndrome (paraplegic): Secondary | ICD-10-CM

## 2015-02-09 DIAGNOSIS — M2560 Stiffness of unspecified joint, not elsewhere classified: Secondary | ICD-10-CM

## 2015-02-09 NOTE — Therapy (Signed)
[place], [place], [address] Phone: [phone]   Fax:  [phone]  Physical Therapy Treatment  Patient Details  Name: Pamela Holloway MRN: [id number] Date of Birth: [birth year] Referring Provider: T  Encounter Date: 02/09/2015      PT End of Session - 02/09/15 1710    Visit Number 12   Number of Visits 12   Date for PT Re-Evaluation 02/09/15   PT Start Time 9532   PT Stop Time 0233   PT Time Calculation (min) 45 min   Activity Tolerance Patient limited by pain;Patient tolerated treatment well  Rt hip pain       Past Medical History  Diagnosis Date  . Hepatitis C   . Hypertension   . Thyroid disease     Hypothyroid  . Hyperlipidemia   . Diabetes mellitus without complication [facility])     Past Surgical History  Procedure Laterality Date  . Cholecystectomy    . Tonsillectomy    . Laparoscopic gastric banding      There were no vitals filed for this visit.  Visit Diagnosis:  Hip pain, right  Stiffness due to immobility  Abnormal posture  Decreased mobility and endurance      Subjective Assessment - 02/09/15 1715    Subjective Was doing well unitl Monday. Pain has been much worse since then and she is not sure why. Her husband has been helping her massage the area and she is continuing with her HEP. What seems to help the most in PT is the dry needling with the e-stim.    Currently in Pain? Yes   Pain Score 6    Pain Location Groin   Pain Orientation Right;Anterior   Pain Descriptors / Indicators Dull   Pain Type Chronic pain   Pain Onset More than a month ago   Pain Frequency Intermittent            [facility] PT Assessment - 02/09/15 0001    Assessment   Medical Diagnosis Rt hip pain   Referring Provider T   Onset Date/Surgical Date 09/13/14   Hand Dominance Right   Next MD Visit 03/10/15   Observation/Other Assessments   Focus on Therapeutic Outcomes (FOTO)  22%  limitation    Sensation   Additional Comments very tight and tender around the Rt hip complex into the hip flexors with radiating symptoms to her knee.    Posture/Postural Control   Posture Comments Rt hemipelvis and hip slightly lower than Lt; Rt knee hyperextended; flexed forward at hips   AROM   Overall AROM Comments Hips WNL passively , Rt hip limted due to pain.    Lumbar Flexion 80%   Lumbar Extension 30%   Lumbar - Right Side Bend 65%   Lumbar - Left Side Bend 60%   Lumbar - Right Rotation 25%   Lumbar - Left Rotation 25%   Flexibility   Hamstrings ~85 deg bialt    Quadriceps heel to buttocks 3 " Rt, 4" Lt   ITB tight bilat   Piriformis tight bilat/pain in Rt groin with testing Rt    Palpation   Palpation comment Significant tightness Rt hip flexors at iliopsoas; anterior quads; sartorius; hip adductors; piriformis; hip abductors   Ambulation/Gait   Gait Comments improved gait pattern with less pain with ambulation                      [facility] Adult PT Treatment/Exercise -  02/09/15 0001    Lumbar Exercises: Supine   Ab Set 10 reps;5 seconds   Clam 10 reps  wiht TA    Heel Slides 10 reps  with TA contractions   Bent Knee Raise 10 reps  with TA contractions   Modalities   Modalities Electrical Stimulation;Moist Heat   Moist Heat Therapy   Number Minutes Moist Heat 15 Minutes   Moist Heat Location Lumbar Spine;Hip  anterior Rt    Electrical Stimulation   Electrical Stimulation Location anterior hip/anterior thigh   Electrical Stimulation Action IFC   Electrical Stimulation Parameters to tolerance   Electrical Stimulation Goals Pain;Tone   Manual Therapy   Soft tissue mobilization Rt hip flexors/thigh quad and adductors                     PT Long Term Goals - 02/09/15 1713    PT LONG TERM GOAL #1   Title Patient I in HEP 02/09/15   Time 6   Period Weeks   Status Achieved   PT LONG TERM GOAL #2   Title Decrease muscular tightness Rt  hip allowing improved hip ROM 02/09/15   Baseline progressing well    Time 6   Period Weeks   Status On-going   PT LONG TERM GOAL #3   Title Improve posture and alignment with standing and walking 02/09/15   Baseline improved standing posture and gait pattern   Time 6   Period Weeks   Status Achieved   PT LONG TERM GOAL #4   Title Patient to tolerant siting/standing/walking > 30-60 min with min to no pain 12/15/14   Time 6   Period Weeks   Status Achieved   PT LONG TERM GOAL #5   Title Improve FOTO to </= 25% limitation 02/09/15   Baseline 22% limitation    Time 6   Period Weeks   Status Achieved               Plan - 02/09/15 1710    Clinical Impression Statement Good improved overall but continues to have pain in the Rt hip. Patient needs to continue with I HEP and will contact MD for further work up of Rt hip pain if symptoms persist. She will call with any questions or problems. Goals of treatment have been partially accomplished.    Pt will benefit from skilled therapeutic intervention in order to improve on the following deficits Postural dysfunction;Improper body mechanics;Decreased range of motion;Decreased mobility;Increased fascial restricitons;Decreased endurance;Decreased activity tolerance;Pain   Rehab Potential Good   PT Frequency 1x / week   PT Duration 6 weeks   PT Treatment/Interventions Patient/family education;ADLs/Self Care Home Management;Manual techniques;Dry needling;Cryotherapy;Electrical Stimulation;Moist Heat;Ultrasound   PT Next Visit Plan  D/C to HEP and return to MD for further hip work up if still catching   PT Home Exercise Plan myofacial release work; stretches for [employer]; education re chairs to sit or rest in - to avoid sitting in soft surfaces in hip flexed position   Consulted and Agree with Plan of Care Patient        Problem List Patient Active Problem List   Diagnosis Date Noted  . Cubital tunnel syndrome on right 12/05/2014  . Left  cervical radiculopathy 10/28/2014  . Degenerative disc disease, cervical 09/10/2013  . Thoracic degenerative disc disease 09/10/2013  . Osteoarthritis of right hip 08/20/2013  . Right lumbar radiculopathy 08/20/2013  . 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 Diabetes mellitus type 2, uncontrolled, without  complications ([place]) 99/37/1696  . History of hepatitis C virus infection 08/09/2008  . HYPERTENSION, BENIGN 07/05/2008  . Bariatric surgery status 06/29/2008  . HYPOTHYROIDISM NOS 11/03/2006  . Hyperlipidemia 11/03/2006  . COMMON MIGRAINE 11/03/2006    Ferne Ellingwood Nilda Simmer PT, MPH  02/09/2015, 5:51 PM  [facility] [place], [place], [address] Phone: [phone]   Fax:  [phone]  Name: Pamela Holloway MRN: [id number] Date of Birth: 05/08/1960  PHYSICAL THERAPY DISCHARGE SUMMARY  Visits from Start of Care: 12  Current functional level related to goals / functional outcomes: Improving mobility with short term pain relief. Pt continues to have pain through Rt anterior hip/groin with significant muscular tightness through hip flexors; quads; hip adductors   Remaining deficits: Pain on an intermittent basis   Education / Equipment: HEP; TENS unit  Plan: Patient agrees to discharge.  Patient goals were partially met. Patient is being discharged due to                                                     ?????   Patient will continue with I management of symptoms and work on HEP prior to RTD 03/10/15.   Ansley Stanwood P. Helene Kelp PT, MPH 02/09/2015 5:54 PM

## 2015-02-14 ENCOUNTER — Encounter: Payer: Self-pay | Admitting: Sports Medicine

## 2015-02-14 ENCOUNTER — Ambulatory Visit (INDEPENDENT_AMBULATORY_CARE_PROVIDER_SITE_OTHER): Payer: BC Managed Care – PPO | Admitting: Sports Medicine

## 2015-02-14 DIAGNOSIS — M1611 Unilateral primary osteoarthritis, right hip: Secondary | ICD-10-CM

## 2015-02-14 MED ORDER — DIAZEPAM 5 MG PO TABS: ORAL_TABLET | ORAL | Status: DC

## 2015-02-14 NOTE — Assessment & Plan Note (Addendum)
Previous injection was over 4 months ago, repeat femoroacetabular injection. Continue physical therapy with needling and e-stim, adding Valium for preprocedural anxiolysis before her next injection.

## 2015-02-14 NOTE — Progress Notes (Signed)
  Subjective:    CC: right hip osteoarthritis  HPI: This is a pleasant 55 year old female, she has known right hip osteoarthritis and has done well with occasional injections, the most recent of which was about 4 months ago, she is having recurrence of pain in the groin, moderate, persistent without radiation, worse with weightbearing.  Past medical history, Surgical history, Family history not pertinant except as noted below, Social history, Allergies, and medications have been entered into the medical record, reviewed, and no changes needed.   Review of Systems: No fevers, chills, night sweats, weight loss, chest pain, or shortness of breath.   Objective:    General: Well Developed, well nourished, and in no acute distress.  Neuro: Alert and oriented x3, extra-ocular muscles intact, sensation grossly intact.  HEENT: Normocephalic, atraumatic, pupils equal round reactive to light, neck supple, no masses, no lymphadenopathy, thyroid nonpalpable.  Skin: Warm and dry, no rashes. Cardiac: Regular rate and rhythm, no murmurs rubs or gallops, no lower extremity edema.  Respiratory: Clear to auscultation bilaterally. Not using accessory muscles, speaking in full sentences.  Procedure: Real-time Ultrasound Guided Injection of Right femoral acetabular joint Device: GE Logiq E  Verbal informed consent obtained.  Time-out conducted.  Noted no overlying erythema, induration, or other signs of local infection.  Skin prepped in a sterile fashion.  Local anesthesia: Topical Ethyl chloride.  With sterile technique and under real time ultrasound guidance:  22-gauge spinal needle advanced to the femoral head/neck junction, 2 mL kenalog 40, 2 mL lidocaine, 2 mL Marcaine injected easily. Completed without difficulty  Pain immediately resolved suggesting accurate placement of the medication.  Advised to call if fevers/chills, erythema, induration, drainage, or persistent bleeding.  Images permanently  stored and available for review in the ultrasound unit.  Impression: Technically successful ultrasound guided injection.  Impression and Recommendations:

## 2015-02-17 ENCOUNTER — Encounter: Payer: Self-pay | Admitting: Gynecology

## 2015-02-17 ENCOUNTER — Ambulatory Visit (INDEPENDENT_AMBULATORY_CARE_PROVIDER_SITE_OTHER): Payer: BC Managed Care – PPO | Admitting: Gynecology

## 2015-02-17 VITALS — BP 124/76 | Ht 65.0 in | Wt 202.0 lb

## 2015-02-17 DIAGNOSIS — N9089 Other specified noninflammatory disorders of vulva and perineum: Secondary | ICD-10-CM

## 2015-02-17 DIAGNOSIS — N906 Unspecified hypertrophy of vulva: Secondary | ICD-10-CM

## 2015-02-17 DIAGNOSIS — Z01419 Encounter for gynecological examination (general) (routine) without abnormal findings: Secondary | ICD-10-CM

## 2015-02-17 NOTE — Patient Instructions (Signed)
Schedule your colonoscopy with either:  [facility]   Address: [address], [place], [place] [address]  [phone]    or  [facility]  Address: [address], [place], [place] [address]  [phone]      You may obtain a copy of any labs that were done today by logging onto MyChart as outlined in the instructions provided with your AVS (after visit summary). The office will not call with normal lab results but certainly if there are any significant abnormalities then we will contact you.   Health Maintenance Adopting a healthy lifestyle and getting preventive care can go a long way to promote health and wellness. Talk with your health care provider about what schedule of regular examinations is right for you. This is a good chance for you to check in with your provider about disease prevention and staying healthy. In between checkups, there are plenty of things you can do on your own. Experts have done a lot of research about which lifestyle changes and preventive measures are most likely to keep you healthy. Ask your health care provider for more information. WEIGHT AND DIET  Eat a healthy diet  Be sure to include plenty of vegetables, fruits, low-fat dairy products, and lean protein.  Do not eat a lot of foods high in solid fats, added sugars, or salt.  Get regular exercise. This is one of the most important things you can do for your health.  Most adults should exercise for at least 150 minutes each week. The exercise should increase your heart rate and make you sweat (moderate-intensity exercise).  Most adults should also do strengthening exercises at least twice a week. This is in addition to the moderate-intensity exercise.  Maintain a healthy weight  Body mass index (BMI) is a measurement that can be used to identify possible weight problems. It estimates body fat based on height and weight. Your health care provider can help determine  your BMI and help you achieve or maintain a healthy weight.  For females 22 years of age and older:   A BMI below 18.5 is considered underweight.  A BMI of 18.5 to 24.9 is normal.  A BMI of 25 to 29.9 is considered overweight.  A BMI of 30 and above is considered obese.  Watch levels of cholesterol and blood lipids  You should start having your blood tested for lipids and cholesterol at 56 years of age, then have this test every 5 years.  You may need to have your cholesterol levels checked more often if:  Your lipid or cholesterol levels are high.  You are older than 56 years of age.  You are at high risk for heart disease.  CANCER SCREENING   Lung Cancer  Lung cancer screening is recommended for adults 19-51 years old who are at high risk for lung cancer because of a history of smoking.  A yearly low-dose CT scan of the lungs is recommended for people who:  Currently smoke.  Have quit within the past 15 years.  Have at least a 30-pack-year history of smoking. A pack year is smoking an average of one pack of cigarettes a day for 1 year.  Yearly screening should continue until it has been 15 years since you quit.  Yearly screening should stop if you develop a health problem that would prevent you from having lung cancer treatment.  Breast Cancer  Practice breast self-awareness. This means understanding how your breasts normally appear and  feel.  It also means doing regular breast self-exams. Let your health care provider know about any changes, no matter how small.  If you are in your 20s or 30s, you should have a clinical breast exam (CBE) by a health care provider every 1-3 years as part of a regular health exam.  If you are 35 or older, have a CBE every year. Also consider having a breast X-ray (mammogram) every year.  If you have a family history of breast cancer, talk to your health care provider about genetic screening.  If you are at high risk for breast  cancer, talk to your health care provider about having an MRI and a mammogram every year.  Breast cancer gene (BRCA) assessment is recommended for women who have family members with BRCA-related cancers. BRCA-related cancers include:  Breast.  Ovarian.  Tubal.  Peritoneal cancers.  Results of the assessment will determine the need for genetic counseling and BRCA1 and BRCA2 testing. Cervical Cancer Routine pelvic examinations to screen for cervical cancer are no longer recommended for nonpregnant women who are considered low risk for cancer of the pelvic organs (ovaries, uterus, and vagina) and who do not have symptoms. A pelvic examination may be necessary if you have symptoms including those associated with pelvic infections. Ask your health care provider if a screening pelvic exam is right for you.   The Pap test is the screening test for cervical cancer for women who are considered at risk.  If you had a hysterectomy for a problem that was not cancer or a condition that could lead to cancer, then you no longer need Pap tests.  If you are older than 65 years, and you have had normal Pap tests for the past 10 years, you no longer need to have Pap tests.  If you have had past treatment for cervical cancer or a condition that could lead to cancer, you need Pap tests and screening for cancer for at least 20 years after your treatment.  If you no longer get a Pap test, assess your risk factors if they change (such as having a new sexual partner). This can affect whether you should start being screened again.  Some women have medical problems that increase their chance of getting cervical cancer. If this is the case for you, your health care provider may recommend more frequent screening and Pap tests.  The human papillomavirus (HPV) test is another test that may be used for cervical cancer screening. The HPV test looks for the virus that can cause cell changes in the cervix. The cells  collected during the Pap test can be tested for HPV.  The HPV test can be used to screen women 72 years of age and older. Getting tested for HPV can extend the interval between normal Pap tests from three to five years.  An HPV test also should be used to screen women of any age who have unclear Pap test results.  After 56 years of age, women should have HPV testing as often as Pap tests.  Colorectal Cancer  This type of cancer can be detected and often prevented.  Routine colorectal cancer screening usually begins at 56 years of age and continues through 56 years of age.  Your health care provider may recommend screening at an earlier age if you have risk factors for colon cancer.  Your health care provider may also recommend using home test kits to check for hidden blood in the stool.  A small camera  at the end of a tube can be used to examine your colon directly (sigmoidoscopy or colonoscopy). This is done to check for the earliest forms of colorectal cancer.  Routine screening usually begins at age 40.  Direct examination of the colon should be repeated every 5-10 years through 56 years of age. However, you may need to be screened more often if early forms of precancerous polyps or small growths are found. Skin Cancer  Check your skin from head to toe regularly.  Tell your health care provider about any new moles or changes in moles, especially if there is a change in a mole's shape or color.  Also tell your health care provider if you have a mole that is larger than the size of a pencil eraser.  Always use sunscreen. Apply sunscreen liberally and repeatedly throughout the day.  Protect yourself by wearing long sleeves, pants, a wide-brimmed hat, and sunglasses whenever you are outside. HEART DISEASE, DIABETES, AND HIGH BLOOD PRESSURE   Have your blood pressure checked at least every 1-2 years. High blood pressure causes heart disease and increases the risk of stroke.  If  you are between 74 years and 72 years old, ask your health care provider if you should take aspirin to prevent strokes.  Have regular diabetes screenings. This involves taking a blood sample to check your fasting blood sugar level.  If you are at a normal weight and have a low risk for diabetes, have this test once every three years after 56 years of age.  If you are overweight and have a high risk for diabetes, consider being tested at a younger age or more often. PREVENTING INFECTION  Hepatitis B  If you have a higher risk for hepatitis B, you should be screened for this virus. You are considered at high risk for hepatitis B if:  You were born in a country where hepatitis B is common. Ask your health care provider which countries are considered high risk.  Your parents were born in a high-risk country, and you have not been immunized against hepatitis B (hepatitis B vaccine).  You have HIV or AIDS.  You use needles to inject street drugs.  You live with someone who has hepatitis B.  You have had sex with someone who has hepatitis B.  You get hemodialysis treatment.  You take certain medicines for conditions, including cancer, organ transplantation, and autoimmune conditions. Hepatitis C  Blood testing is recommended for:  Everyone born from [birth year] through 1966.  Anyone with known risk factors for hepatitis C. Sexually transmitted infections (STIs)  You should be screened for sexually transmitted infections (STIs) including gonorrhea and chlamydia if:  You are sexually active and are younger than 56 years of age.  You are older than 56 years of age and your health care provider tells you that you are at risk for this type of infection.  Your sexual activity has changed since you were last screened and you are at an increased risk for chlamydia or gonorrhea. Ask your health care provider if you are at risk.  If you do not have HIV, but are at risk, it may be recommended that  you take a prescription medicine daily to prevent HIV infection. This is called pre-exposure prophylaxis (PrEP). You are considered at risk if:  You are sexually active and do not regularly use condoms or know the HIV status of your partner(s).  You take drugs by injection.  You are sexually active with a partner  who has HIV. Talk with your health care provider about whether you are at high risk of being infected with HIV. If you choose to begin PrEP, you should first be tested for HIV. You should then be tested every 3 months for as long as you are taking PrEP.  PREGNANCY   If you are premenopausal and you may become pregnant, ask your health care provider about preconception counseling.  If you may become pregnant, take 400 to 800 micrograms (mcg) of folic acid every day.  If you want to prevent pregnancy, talk to your health care provider about birth control (contraception). OSTEOPOROSIS AND MENOPAUSE   Osteoporosis is a disease in which the bones lose minerals and strength with aging. This can result in serious bone fractures. Your risk for osteoporosis can be identified using a bone density scan.  If you are 8 years of age or older, or if you are at risk for osteoporosis and fractures, ask your health care provider if you should be screened.  Ask your health care provider whether you should take a calcium or vitamin D supplement to lower your risk for osteoporosis.  Menopause may have certain physical symptoms and risks.  Hormone replacement therapy may reduce some of these symptoms and risks. Talk to your health care provider about whether hormone replacement therapy is right for you.  HOME CARE INSTRUCTIONS   Schedule regular health, dental, and eye exams.  Stay current with your immunizations.   Do not use any tobacco products including cigarettes, chewing tobacco, or electronic cigarettes.  If you are pregnant, do not drink alcohol.  If you are breastfeeding, limit how  much and how often you drink alcohol.  Limit alcohol intake to no more than 1 drink per day for nonpregnant women. One drink equals 12 ounces of beer, 5 ounces of wine, or 1 ounces of hard liquor.  Do not use street drugs.  Do not share needles.  Ask your health care provider for help if you need support or information about quitting drugs.  Tell your health care provider if you often feel depressed.  Tell your health care provider if you have ever been abused or do not feel safe at home. Document Released: 07/30/2010 Document Revised: 05/31/2013 Document Reviewed: 12/16/2012 [facility] Patient Information 2015 [place], [place]. This information is not intended to replace advice given to you by your health care provider. Make sure you discuss any questions you have with your health care provider.

## 2015-02-17 NOTE — Progress Notes (Signed)
ROLLENE GELPI August 30, 1960 [id number]        55 y.o.  G2P2002  for annual exam.  Doing well.  Past medical history,surgical history, problem list, medications, allergies, family history and social history were all reviewed and documented as reviewed in the EPIC chart.  ROS:  Performed with pertinent positives and negatives included in the history, assessment and plan.   Additional significant findings :  none   Exam: [name] assistant Filed Vitals:   02/17/15 1500  BP: 124/76  Height: 5\' 5"  (1.651 m)  Weight: 202 lb (91.627 kg)   General appearance:  Normal affect, orientation and appearance. Skin: Grossly normal HEENT: Without gross lesions.  No cervical or supraclavicular adenopathy. Thyroid normal.  Lungs:  Clear without wheezing, rales or rhonchi Cardiac: RR, without RMG Abdominal:  Soft, nontender, without masses, guarding, rebound, organomegaly or hernia Breasts:  Examined lying and sitting without masses, retractions, discharge or axillary adenopathy. Pelvic:  Ext/BUS/vagina normal with the exception of right labial minora hypertrophy and a small draining area superior edge of the labia minora  Cervix normal  Uterus axial to anteverted, normal size, shape and contour, midline and mobile nontender   Adnexa  Without masses or tenderness    Anus and perineum  Normal   Rectovaginal  Normal sphincter tone without palpated masses or tenderness.    Assessment/Plan:  55 y.o. G41P2002 female for annual exam. No vaginal bleeding, vasectomy birth control.  1. Postmenopausal. Without significant hot flushes, night sweats, vaginal dryness or any vaginal bleeding. Continue to monitor and report any issues or vaginal bleeding. 2. Small draining area right labia minora. Patient notes small bump which I think was probably a small boil which has drained. Does not appear ulcerative such as HSV. Recommend observation/soaks and assuming it resolves then follow. If it persists changes or  enlarges then follow up for further evaluation. 3. Right labia minora hypertrophy.  Stable over years time observation. Not bothersome to the patient. Continue to observe. 4. Mammography coming due in March/April. Reminded patient to schedule when she agrees to do so. SBE reviewed. 5. Pap smear 01/2013 normal. No Pap smear done today. Plan repeat Pap smear at 3 year interval per current screening guidelines. No history of abnormal Pap smears previously. 6. Colonoscopy never. I again strongly recommended patient schedule a screening colonoscopy. Names and numbers provided. Patient agrees to do so. 7. Health maintenance. No routine lab work done as this is done through her primary physician's office who follows her for her medical issues. Follow up in one year, sooner as needed.   [name], 3:24 PM 02/17/2015

## 2015-02-22 ENCOUNTER — Other Ambulatory Visit: Payer: Self-pay

## 2015-02-22 DIAGNOSIS — M5412 Radiculopathy, cervical region: Secondary | ICD-10-CM

## 2015-02-22 MED ORDER — MELOXICAM 15 MG PO TABS: ORAL_TABLET | ORAL | Status: DC

## 2015-02-23 ENCOUNTER — Other Ambulatory Visit: Payer: Self-pay | Admitting: Family Medicine

## 2015-02-23 MED ORDER — GLIPIZIDE 10 MG PO TABS: 10.0000 mg | ORAL_TABLET | Freq: Two times a day (BID) | ORAL | Status: DC

## 2015-03-10 ENCOUNTER — Encounter: Payer: Self-pay | Admitting: Family Medicine

## 2015-03-10 ENCOUNTER — Ambulatory Visit (INDEPENDENT_AMBULATORY_CARE_PROVIDER_SITE_OTHER): Payer: BC Managed Care – PPO | Admitting: Family Medicine

## 2015-03-10 ENCOUNTER — Ambulatory Visit: Payer: BC Managed Care – PPO | Admitting: Sports Medicine

## 2015-03-10 VITALS — BP 140/82 | HR 102 | Ht 65.0 in | Wt 204.1 lb

## 2015-03-10 DIAGNOSIS — E1165 Type 2 diabetes mellitus with hyperglycemia: Secondary | ICD-10-CM | POA: Diagnosis not present

## 2015-03-10 DIAGNOSIS — J069 Acute upper respiratory infection, unspecified: Secondary | ICD-10-CM | POA: Diagnosis not present

## 2015-03-10 DIAGNOSIS — E039 Hypothyroidism, unspecified: Secondary | ICD-10-CM | POA: Diagnosis not present

## 2015-03-10 DIAGNOSIS — IMO0001 Reserved for inherently not codable concepts without codable children: Secondary | ICD-10-CM

## 2015-03-10 DIAGNOSIS — I10 Essential (primary) hypertension: Secondary | ICD-10-CM | POA: Diagnosis not present

## 2015-03-10 LAB — POCT GLYCOSYLATED HEMOGLOBIN (HGB A1C): Hemoglobin A1C: 9

## 2015-03-10 MED ORDER — DULAGLUTIDE 0.75 MG/0.5ML ~~LOC~~ SOAJ: 0.7500 mg | SUBCUTANEOUS | Status: DC

## 2015-03-10 MED ORDER — AZITHROMYCIN 250 MG PO TABS: ORAL_TABLET | ORAL | Status: AC

## 2015-03-10 NOTE — Progress Notes (Signed)
   Subjective:    Patient ID: Pamela Holloway, female    DOB: 1960/06/02, 55 y.o.   MRN: [id number]  HPI Diabetes - no hypoglycemic events. No wounds or sores that are not healing well. No increased thirst or urination. Checking glucose at home. Taking medications as prescribed without any side effects. He says she has been tolerating her birthday and her husband's birthday and so has been eating a lot of things she shouldn't.  Hypertension- Pt denies chest pain, SOB, dizziness, or heart palpitations.  Taking meds as directed w/o problems.  Denies medication side effects.    2 days of ST and cough and nasal congestion. No fever, chills.  She is worried because her husband has had a cold the last 2-3 months. He has some sinus pressure and headache today. No GI symptoms.  Review of Systems     Objective:   Physical Exam  Constitutional: She is oriented to person, place, and time. She appears well-developed and well-nourished.  HENT:  Head: Normocephalic and atraumatic.  Right Ear: External ear normal.  Left Ear: External ear normal.  Nose: Nose normal.  Mouth/Throat: Oropharynx is clear and moist.  TMs and canals are clear.   Eyes: Conjunctivae and EOM are normal. Pupils are equal, round, and reactive to light.  Neck: Neck supple. No thyromegaly present.  Cardiovascular: Normal rate, regular rhythm and normal heart sounds.   Pulmonary/Chest: Effort normal and breath sounds normal. She has no wheezes.  Lymphadenopathy:    She has no cervical adenopathy.  Neurological: She is alert and oriented to person, place, and time.  Skin: Skin is warm and dry.  Psychiatric: She has a normal mood and affect. Her behavior is normal.        Assessment & Plan:  DM- A1C is up to 9.0, previous of 8.9.  Uncontrolled. We discussed the addition of Trulicity. Discussed dental side effects of the medication. Coupon card provided. Demonstrate how to use the device here in the office. Follow-up in 3 months.  If she has any difficulty getting the medication, if it's not covered by her insurance, and I encouraged her to call us back or have the pharmacy call us and we can switch medications. Otherwise continue current regimen with Farxiga and metformin.  HTN - borderline elevated today but she has gained a couple pounds since I last saw her. Encouraged her to get back on track. Her blood pressure actually looked great in January. Continue current regimen.  URI - likely viral. I did go ahead and give her prescription for azithromycin take if she suddenly gets worse or is not better after one week. She will be doing some traveling same.  Hypothyroidism-due to recheck TSH. If her TSH is still in the 3 range then we'll try adjusting her dose  Dr. Abbie Sons for pap smear.   Declined flu vaccine  Had eye exam in October.

## 2015-03-11 LAB — BASIC METABOLIC PANEL WITH GFR
BUN: 10 mg/dL (ref 7–25)
CALCIUM: 10 mg/dL (ref 8.6–10.4)
CO2: 24 mmol/L (ref 20–31)
Chloride: 98 mmol/L (ref 98–110)
Creat: 0.92 mg/dL (ref 0.50–1.05)
GFR, EST AFRICAN AMERICAN: 80 mL/min (ref >=60)
GFR, EST NON AFRICAN AMERICAN: 70 mL/min (ref >=60)
GLUCOSE: 174 mg/dL — AB (ref 65–99)
Potassium: 4.1 mmol/L (ref 3.5–5.3)
SODIUM: 137 mmol/L (ref 135–146)

## 2015-03-11 LAB — TSH: TSH: 3 mIU/L

## 2015-03-13 NOTE — Addendum Note (Signed)
Addended by: Teddy Spike on: 03/13/2015 05:36 PM   Modules accepted: Orders

## 2015-03-23 NOTE — Telephone Encounter (Signed)
error 

## 2015-04-11 ENCOUNTER — Other Ambulatory Visit: Payer: Self-pay | Admitting: *Deleted

## 2015-04-11 MED ORDER — DAPAGLIFLOZIN PROPANEDIOL 10 MG PO TABS: 10.0000 mg | ORAL_TABLET | Freq: Every day | ORAL | Status: DC

## 2015-04-25 ENCOUNTER — Other Ambulatory Visit: Payer: Self-pay | Admitting: Family Medicine

## 2015-04-25 LAB — TSH: TSH: 0.01 mIU/L — ABNORMAL LOW

## 2015-04-25 MED ORDER — LEVOTHYROXINE SODIUM 50 MCG PO TABS: 50.0000 ug | ORAL_TABLET | ORAL | Status: DC

## 2015-05-02 ENCOUNTER — Ambulatory Visit (HOSPITAL_BASED_OUTPATIENT_CLINIC_OR_DEPARTMENT_OTHER)
Admission: RE | Admit: 2015-05-02 | Discharge: 2015-05-02 | Disposition: A | Discharge status: Home / Self Care | Payer: BC Managed Care – PPO | Source: Ambulatory Visit | Attending: Gynecology | Admitting: Gynecology

## 2015-05-02 DIAGNOSIS — Z1231 Encounter for screening mammogram for malignant neoplasm of breast: Secondary | ICD-10-CM | POA: Diagnosis not present

## 2015-05-02 DIAGNOSIS — Z9289 Personal history of other medical treatment: Secondary | ICD-10-CM | POA: Insufficient documentation

## 2015-05-05 ENCOUNTER — Ambulatory Visit (INDEPENDENT_AMBULATORY_CARE_PROVIDER_SITE_OTHER): Payer: BC Managed Care – PPO | Admitting: Sports Medicine

## 2015-05-05 ENCOUNTER — Ambulatory Visit: Payer: BC Managed Care – PPO | Admitting: Sports Medicine

## 2015-05-05 ENCOUNTER — Encounter: Payer: Self-pay | Admitting: Sports Medicine

## 2015-05-05 DIAGNOSIS — M1611 Unilateral primary osteoarthritis, right hip: Secondary | ICD-10-CM | POA: Diagnosis not present

## 2015-05-05 NOTE — Assessment & Plan Note (Signed)
Been several hip joint injections, they have been lasting about 3 months but I do think it's time for her to consider hip arthroplasty. Referral to Dr. Mayer Camel, discussed that he will often do anterior approach resulting in short recovery. Return as needed.

## 2015-05-05 NOTE — Progress Notes (Signed)
  Subjective:    CC: Follow-up   HPI: Right hip osteoarthritis: Has been through several injections, arthritis his x-ray confirmed, injections of her right at fantastic relief however the most recent of which has not lasted a full 3 months.  Having a recurrence of pain, severe, persistent, localized in the groin and worse with activity and weightbearing.  Past medical history, Surgical history, Family history not pertinant except as noted below, Social history, Allergies, and medications have been entered into the medical record, reviewed, and no changes needed.   Review of Systems: No fevers, chills, night sweats, weight loss, chest pain, or shortness of breath.   Objective:    General: Well Developed, well nourished, and in no acute distress.  Neuro: Alert and oriented x3, extra-ocular muscles intact, sensation grossly intact.  HEENT: Normocephalic, atraumatic, pupils equal round reactive to light, neck supple, no masses, no lymphadenopathy, thyroid nonpalpable.  Skin: Warm and dry, no rashes. Cardiac: Regular rate and rhythm, no murmurs rubs or gallops, no lower extremity edema.  Respiratory: Clear to auscultation bilaterally. Not using accessory muscles, speaking in full sentences.  Procedure: Real-time Ultrasound Guided Injection of right hip joint Device: GE Logiq E  Verbal informed consent obtained.  Time-out conducted.  Noted no overlying erythema, induration, or other signs of local infection.  Skin prepped in a sterile fashion.  Local anesthesia: Topical Ethyl chloride.  With sterile technique and under real time ultrasound guidance:  22-gauge spinal needle advanced to the femoral head/neck junction, entered the capsule and 2 mL kenalog 40, 2 mL lidocaine, 2 mL Marcaine injected easily. Completed without difficulty  Pain immediately resolved suggesting accurate placement of the medication.  Advised to call if fevers/chills, erythema, induration, drainage, or persistent  bleeding.  Images permanently stored and available for review in the ultrasound unit.  Impression: Technically successful ultrasound guided injection.  Impression and Recommendations:

## 2015-05-08 ENCOUNTER — Other Ambulatory Visit: Payer: Self-pay | Admitting: Family Medicine

## 2015-05-10 ENCOUNTER — Ambulatory Visit: Payer: BC Managed Care – PPO | Admitting: Sports Medicine

## 2015-05-11 ENCOUNTER — Ambulatory Visit: Payer: BC Managed Care – PPO | Admitting: Sports Medicine

## 2015-05-24 ENCOUNTER — Other Ambulatory Visit: Payer: Self-pay | Admitting: Family Medicine

## 2015-06-14 ENCOUNTER — Other Ambulatory Visit: Payer: Self-pay

## 2015-06-14 DIAGNOSIS — M5412 Radiculopathy, cervical region: Secondary | ICD-10-CM

## 2015-06-14 MED ORDER — MELOXICAM 15 MG PO TABS: ORAL_TABLET | ORAL | Status: DC

## 2015-06-19 ENCOUNTER — Other Ambulatory Visit: Payer: Self-pay | Admitting: *Deleted

## 2015-06-19 MED ORDER — OMEGA-3-ACID ETHYL ESTERS 1 G PO CAPS: 2.0000 | ORAL_CAPSULE | Freq: Two times a day (BID) | ORAL | Status: DC

## 2015-06-23 ENCOUNTER — Ambulatory Visit (INDEPENDENT_AMBULATORY_CARE_PROVIDER_SITE_OTHER): Payer: BC Managed Care – PPO | Admitting: Family Medicine

## 2015-06-23 ENCOUNTER — Encounter: Payer: Self-pay | Admitting: Gynecology

## 2015-06-23 ENCOUNTER — Ambulatory Visit (INDEPENDENT_AMBULATORY_CARE_PROVIDER_SITE_OTHER): Payer: BC Managed Care – PPO | Admitting: Gynecology

## 2015-06-23 ENCOUNTER — Telehealth: Payer: Self-pay | Admitting: *Deleted

## 2015-06-23 ENCOUNTER — Other Ambulatory Visit: Payer: Self-pay | Admitting: Family Medicine

## 2015-06-23 ENCOUNTER — Telehealth: Payer: Self-pay | Admitting: Sports Medicine

## 2015-06-23 ENCOUNTER — Encounter: Payer: Self-pay | Admitting: Family Medicine

## 2015-06-23 VITALS — BP 140/90

## 2015-06-23 VITALS — BP 155/66 | HR 89 | Wt 197.0 lb

## 2015-06-23 DIAGNOSIS — I10 Essential (primary) hypertension: Secondary | ICD-10-CM

## 2015-06-23 DIAGNOSIS — R21 Rash and other nonspecific skin eruption: Secondary | ICD-10-CM | POA: Diagnosis not present

## 2015-06-23 DIAGNOSIS — M1611 Unilateral primary osteoarthritis, right hip: Secondary | ICD-10-CM

## 2015-06-23 DIAGNOSIS — E0590 Thyrotoxicosis, unspecified without thyrotoxic crisis or storm: Secondary | ICD-10-CM

## 2015-06-23 DIAGNOSIS — N760 Acute vaginitis: Secondary | ICD-10-CM

## 2015-06-23 DIAGNOSIS — A499 Bacterial infection, unspecified: Secondary | ICD-10-CM | POA: Diagnosis not present

## 2015-06-23 DIAGNOSIS — L304 Erythema intertrigo: Secondary | ICD-10-CM

## 2015-06-23 DIAGNOSIS — E669 Obesity, unspecified: Secondary | ICD-10-CM

## 2015-06-23 DIAGNOSIS — E039 Hypothyroidism, unspecified: Secondary | ICD-10-CM

## 2015-06-23 DIAGNOSIS — E1165 Type 2 diabetes mellitus with hyperglycemia: Secondary | ICD-10-CM | POA: Diagnosis not present

## 2015-06-23 DIAGNOSIS — B9689 Other specified bacterial agents as the cause of diseases classified elsewhere: Secondary | ICD-10-CM

## 2015-06-23 DIAGNOSIS — E119 Type 2 diabetes mellitus without complications: Secondary | ICD-10-CM | POA: Diagnosis not present

## 2015-06-23 DIAGNOSIS — IMO0001 Reserved for inherently not codable concepts without codable children: Secondary | ICD-10-CM

## 2015-06-23 LAB — COMPLETE METABOLIC PANEL WITH GFR
ALBUMIN: 4.6 g/dL (ref 3.6–5.1)
ALT: 43 U/L — ABNORMAL HIGH (ref 6–29)
AST: 30 U/L (ref 10–35)
Alkaline Phosphatase: 89 U/L (ref 33–130)
BUN: 16 mg/dL (ref 7–25)
CALCIUM: 9.8 mg/dL (ref 8.6–10.4)
CHLORIDE: 98 mmol/L (ref 98–110)
CO2: 25 mmol/L (ref 20–31)
CREATININE: 0.87 mg/dL (ref 0.50–1.05)
GFR, EST AFRICAN AMERICAN: 86 mL/min (ref >=60)
GFR, EST NON AFRICAN AMERICAN: 75 mL/min (ref >=60)
Glucose, Bld: 157 mg/dL — ABNORMAL HIGH (ref 65–99)
Potassium: 4.1 mmol/L (ref 3.5–5.3)
Sodium: 139 mmol/L (ref 135–146)
TOTAL PROTEIN: 7.2 g/dL (ref 6.1–8.1)
Total Bilirubin: 1.2 mg/dL (ref 0.2–1.2)

## 2015-06-23 LAB — LIPID PANEL
CHOL/HDL RATIO: 5.9 ratio — AB (ref <=5.0)
CHOLESTEROL: 243 mg/dL — AB (ref 125–200)
HDL: 41 mg/dL — ABNORMAL LOW (ref >=46)
TRIGLYCERIDES: 447 mg/dL — AB (ref <150)

## 2015-06-23 LAB — WET PREP FOR TRICH, YEAST, CLUE
CLUE CELLS WET PREP: NONE SEEN
Trich, Wet Prep: NONE SEEN
Yeast Wet Prep HPF POC: NONE SEEN

## 2015-06-23 LAB — POCT GLYCOSYLATED HEMOGLOBIN (HGB A1C): HEMOGLOBIN A1C: 6.2

## 2015-06-23 LAB — TSH: TSH: 0.99 m[IU]/L

## 2015-06-23 MED ORDER — DAPAGLIFLOZIN PRO-METFORMIN ER 10-1000 MG PO TB24: 1.0000 | ORAL_TABLET | Freq: Every day | ORAL | Status: DC

## 2015-06-23 MED ORDER — LIDOCAINE-PRILOCAINE 2.5-2.5 % EX CREA: 1.0000 "application " | TOPICAL_CREAM | CUTANEOUS | Status: DC | PRN

## 2015-06-23 MED ORDER — NYSTATIN-TRIAMCINOLONE 100000-0.1 UNIT/GM-% EX CREA
1.0000 | TOPICAL_CREAM | Freq: Three times a day (TID) | CUTANEOUS | Status: DC
Start: 2015-06-23 — End: 2015-08-18

## 2015-06-23 NOTE — Telephone Encounter (Signed)
Pt called and would like to get her labs done prior to her appt today. Labs ordered and faxed.Pamela Holloway

## 2015-06-23 NOTE — Progress Notes (Signed)
   HPI: Is a 55 year old that presented to the office today complaining of vulvar irritation and pruritus especially around her groin. Patient is a monogamous relationship she denies any vaginal discharge. She denies any dysuria, frequency, fever, chills, nausea, vomiting or any GI issues. She was diagnosis with type 2 diabetes. She is on no hormone replacement therapy.   ROS: A ROS was performed and pertinent positives and negatives are included in the history.  GENERAL: No fevers or chills. HEENT: No change in vision, no earache, sore throat or sinus congestion. NECK: No pain or stiffness. CARDIOVASCULAR: No chest pain or pressure. No palpitations. PULMONARY: No shortness of breath, cough or wheeze. GASTROINTESTINAL: No abdominal pain, nausea, vomiting or diarrhea, melena or bright red blood per rectum. GENITOURINARY: No urinary frequency, urgency, hesitancy or dysuria. MUSCULOSKELETAL: No joint or muscle pain, no back pain, no recent trauma. DERMATOLOGIC: No rash, no itching, no lesions. ENDOCRINE: No polyuria, polydipsia, no heat or cold intolerance. No recent change in weight. HEMATOLOGICAL: No anemia or easy bruising or bleeding. NEUROLOGIC: No headache, seizures, numbness, tingling or weakness. PSYCHIATRIC: No depression, no loss of interest in normal activity or change in sleep pattern.   PE: Blood pressure 140/90 Gen. appearance overweight white female with the above mentioned complaint Pelvic exam: Excoriated area erythematous medial thigh groin area and labia majora consistent with intertrigo Vaginal exam: Speculum was introduced a vagina no gross lesions on inspection Cervix: No lesions or discharge Bimanual exam not done Rectal exam: Not done  Wet prep too numerous to count white blood cells moderate bacteria inside vagina external vagina negative    Assessment Plan: Patient been a diabetic in the areas noted on the crease of her groin and labia majora consistent with intertrigo  attributed to yeast. She will be placed on mytrex [employer] cream to apply externally twice a day for the next 7-10 days and then use once a week when necessary. Also for her bacterial vaginosis she will be prescribed Tindamax 500 mg tablets for [place]us today repeat in 24 hours.    Greater than 50% of time was spent in counseling and coordinating care of this patient.   Time of consultation: 15   Minutes.

## 2015-06-23 NOTE — Patient Instructions (Addendum)
Nystatin; Triamcinolone cream or ointment What is this medicine? NYSTATIN; TRIAMCINOLONE (nye STAT in; trye am SIN oh lone) is a combination of an antifungal medicine and a steroid. It is used to treat certain kinds of fungal or yeast infections of the skin. This medicine may be used for other purposes; ask your health care provider or pharmacist if you have questions. What should I tell my health care provider before I take this medicine? They need to know if you have any of these conditions: -large areas of burned or damaged skin -skin wasting or thinning -peripheral vascular disease or poor circulation -an unusual or allergic reaction to nystatin, triamcinolone, other corticosteroids, other medicines, foods, dyes, or preservatives -pregnant or trying to get pregnant -breast-feeding How should I use this medicine? This medicine is for external use only. Do not take by mouth. Follow the directions on the prescription label. Wash your hands before and after use. If treating hand or nail infections, wash hands before use only. Apply a thin layer of this medicine to the affected area and rub in gently. Do not use on healthy skin or over large areas of skin. Do not get this medicine in your eyes. If you do, rinse out with plenty of cool tap water. When applying to the groin area, apply a limited amount and do not use for longer than 2 weeks unless directed to by your doctor or health care professional. Do not cover or wrap the treated area with an airtight bandage (such as a plastic bandage). Use the full course of treatment prescribed, even if you think the infection is getting better. Use at regular intervals. Do not use your medicine more often than directed. Do not use this medicine for any condition other than the one for which it was prescribed. Talk to your pediatrician regarding the use of this medicine in children. While this drug may be prescribed for selected conditions, precautions do apply.  Children being treated in the diaper area should not wear tight-fitting diapers or plastic pants. Elderly patients are more likely to have damaged skin through aging, and this may increase side effects. This medicine should only be used for brief periods and infrequently in older patients. Overdosage: If you think you have taken too much of this medicine contact a poison control center or emergency room at once. NOTE: This medicine is only for you. Do not share this medicine with others. What if I miss a dose? If you miss a dose, use it as soon as you can. If it is almost time for your next dose, use only that dose. Do not use double or extra doses. What may interact with this medicine? Interactions are not expected. Do not use any other skin products on the affected area without telling your doctor or health care professional. This list may not describe all possible interactions. Give your health care provider a list of all the medicines, herbs, non-prescription drugs, or dietary supplements you use. Also tell them if you smoke, drink alcohol, or use illegal drugs. Some items may interact with your medicine. What should I watch for while using this medicine? Tell your doctor or health care professional if your symptoms do not start to get better within 1 week when treating the groin area or within 2 weeks when treating the feet. . Tell your doctor or health care professional if you develop sores or blisters that do not heal properly. If your skin infection returns after stopping this medicine, contact your doctor  or health care professional. If you are using this medicine to treat an infection in the groin area, do not wear underwear that is tight-fitting or made from synthetic fibers such as rayon or nylon. Instead, wear loose-fitting, cotton underwear. Also dry the area completely after bathing. What side effects may I notice from receiving this medicine? Side effects that you should report to your  doctor or health care professional as soon as possible: -burning or itching of the skin -dark red spots on the skin -loss of feeling on skin -painful, red, pus-filled blisters in hair follicles -skin infection -thinning of the skin or sunburn: more likely if applied to the face Side effects that usually do not require medical attention (report to your doctor or health care professional if they continue or are bothersome): -dry or peeling skin -skin irritation This list may not describe all possible side effects. Call your doctor for medical advice about side effects. You may report side effects to FDA at 1-800-FDA-1088. Where should I keep my medicine? Keep out of the reach of children. Store at room temperature between 15 and 30 degrees C (59 and 86 degrees F). Do not freeze. Throw away any unused medicine after the expiration date. NOTE: This sheet is a summary. It may not cover all possible information. If you have questions about this medicine, talk to your doctor, pharmacist, or health care provider.    2016, Elsevier/Gold Standard. (2007-08-07 17:29:26) Intertrigo Intertrigo is a skin condition that occurs in between folds of skin in places on the body that rub together a lot and do not get much ventilation. It is caused by heat, moisture, friction, sweat retention, and lack of air circulation, which produces red, irritated patches and, sometimes, scaling or drainage. People who have diabetes, who are obese, or who have treatment with antibiotics are at increased risk for intertrigo. The most common sites for intertrigo to occur include:  The groin.  The breasts.  The armpits.  Folds of abdominal skin.  Webbed spaces between the fingers or toes. Intertrigo may be aggravated by:  Sweat.  Feces.  Yeast or bacteria that are present near skin folds.  Urine.  Vaginal discharge. HOME CARE INSTRUCTIONS  The following steps can be taken to reduce friction and keep the  affected area cool and dry:  Expose skin folds to the air.  Keep deep skin folds separated with cotton or linen cloth. Avoid tight fitting clothing that could cause chafing.  Wear open-toed shoes or sandals to help reduce moisture between the toes.  Apply absorbent powders to affected areas as directed by your caregiver.  Apply over-the-counter barrier pastes, such as zinc oxide, as directed by your caregiver.  If you develop a fungal infection in the affected area, your caregiver may have you use antifungal creams. SEEK MEDICAL CARE IF:   The rash is not improving after 1 week of treatment.  The rash is getting worse (more red, more swollen, more painful, or spreading).  You have a fever or chills. MAKE SURE YOU:   Understand these instructions.  Will watch your condition.  Will get help right away if you are not doing well or get worse.   This information is not intended to replace advice given to you by your health care provider. Make sure you discuss any questions you have with your health care provider.   Document Released: 01/14/2005 Document Revised: 04/08/2011 Document Reviewed: 07/18/2014 Elsevier Interactive Patient Education 2016 Elsevier Inc. Bacterial Vaginosis Bacterial vaginosis is a  vaginal infection that occurs when the normal balance of bacteria in the vagina is disrupted. It results from an overgrowth of certain bacteria. This is the most common vaginal infection in women of childbearing age. Treatment is important to prevent complications, especially in pregnant women, as it can cause a premature delivery. CAUSES  Bacterial vaginosis is caused by an increase in harmful bacteria that are normally present in smaller amounts in the vagina. Several different kinds of bacteria can cause bacterial vaginosis. However, the reason that the condition develops is not fully understood. RISK FACTORS Certain activities or behaviors can put you at an increased risk of  developing bacterial vaginosis, including: Having a new sex partner or multiple sex partners. Douching. Using an intrauterine device (IUD) for contraception. Women do not get bacterial vaginosis from toilet seats, bedding, swimming pools, or contact with objects around them. SIGNS AND SYMPTOMS  Some women with bacterial vaginosis have no signs or symptoms. Common symptoms include: Grey vaginal discharge. A fishlike odor with discharge, especially after sexual intercourse. Itching or burning of the vagina and vulva. Burning or pain with urination. DIAGNOSIS  Your health care provider will take a medical history and examine the vagina for signs of bacterial vaginosis. A sample of vaginal fluid may be taken. Your health care provider will look at this sample under a microscope to check for bacteria and abnormal cells. A vaginal pH test may also be done.  TREATMENT  Bacterial vaginosis may be treated with antibiotic medicines. These may be given in the form of a pill or a vaginal cream. A second round of antibiotics may be prescribed if the condition comes back after treatment. Because bacterial vaginosis increases your risk for sexually transmitted diseases, getting treated can help reduce your risk for chlamydia, gonorrhea, HIV, and herpes. HOME CARE INSTRUCTIONS  Only take over-the-counter or prescription medicines as directed by your health care provider. If antibiotic medicine was prescribed, take it as directed. Make sure you finish it even if you start to feel better. Tell all sexual partners that you have a vaginal infection. They should see their health care provider and be treated if they have problems, such as a mild rash or itching. During treatment, it is important that you follow these instructions: Avoid sexual activity or use condoms correctly. Do not douche. Avoid alcohol as directed by your health care provider. Avoid breastfeeding as directed by your health care provider. SEEK  MEDICAL CARE IF:  Your symptoms are not improving after 3 days of treatment. You have increased discharge or pain. You have a fever. MAKE SURE YOU:  Understand these instructions. Will watch your condition. Will get help right away if you are not doing well or get worse. FOR MORE INFORMATION  Centers for Disease Control and Prevention, Division of STD Prevention: AppraiserFraud.fi American Sexual Health Association (ASHA): www.ashastd.org    This information is not intended to replace advice given to you by your health care provider. Make sure you discuss any questions you have with your health care provider.   Document Released: 01/14/2005 Document Revised: 02/04/2014 Document Reviewed: 08/26/2012 Elsevier Interactive Patient Education [employer].

## 2015-06-23 NOTE — Telephone Encounter (Signed)
08-28-15 this patient wanted you to know that she has hip replacement surgery scheduled on that day with Dr.Rowan

## 2015-06-23 NOTE — Patient Instructions (Signed)
Once you are out of [place] and metfomin you can start the Xigduo once a day in the place of the [place] and metformin.

## 2015-06-23 NOTE — Progress Notes (Signed)
Subjective:    CC:   HPI:  Diabetes - no hypoglycemic events. No wounds or sores that are not healing well. No increased thirst or urination. Checking glucose at home. Taking medications as prescribed without any side effects. She is on trulicity and [place] and glipizide.    Hypertension- Pt denies chest pain, SOB, dizziness, or heart palpitations.  Taking meds as directed w/o problems.  Denies medication side effects.    BC-she's really made strides to get her diet under control and watch portion sizes. She is Artie lost 7 pounds since February. She wants to lose weight before her hip replacement surgery.  She is scheduled for hip replacement on July 31.  Past medical history, Surgical history, Family history not pertinant except as noted below, Social history, Allergies, and medications have been entered into the medical record, reviewed, and corrections made.   Review of Systems: No fevers, chills, night sweats, weight loss, chest pain, or shortness of breath.   Objective:    General: Well Developed, well nourished, and in no acute distress.  Neuro: Alert and oriented x3, extra-ocular muscles intact, sensation grossly intact.  HEENT: Normocephalic, atraumatic  Skin: Warm and dry, no rashes. Cardiac: Regular rate and rhythm, no murmurs rubs or gallops, no lower extremity edema.  Respiratory: Clear to auscultation bilaterally. Not using accessory muscles, speaking in full sentences.   Impression and Recommendations:   Diabetes  - Well-controlled. Hemoglobin A1c down to 6.2 from previous of 9 which is absolutely fantastic. She is doing a great job. She really is struggling with doing her Trulicity injection. She says it takes her homocystine 20 minutes to work up to doing it because it so uncomfortable. 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 Her husband typically does it for her. We will try EMLA cream and see if this makes it more comfortable. We will also combining her metformin and  reports he got into combination Xigduo to make her medication regimen more simple. Encouraged her to continue working on diet and weight. She is doing absolutely fantastic. And hopefully we can eventually come off of some of the medication.  HTN - mildly elevated today. Blood pressure was at goal last month so we will just keep an eye on it for now. Continue work on diet and exercise.  Due for CMP and lipid panel. She went for blood work that morning. Next  Obesity/BMI 32-she is doing fantastic and has already lost 7 pounds since February. She says she's really been trying to eat more healthy overall.  Scheduled for right hip replacement in July.

## 2015-06-28 ENCOUNTER — Other Ambulatory Visit: Payer: Self-pay | Admitting: *Deleted

## 2015-06-28 DIAGNOSIS — E039 Hypothyroidism, unspecified: Secondary | ICD-10-CM

## 2015-06-28 LAB — LDL CHOLESTEROL, DIRECT: Direct LDL: 134 mg/dL — ABNORMAL HIGH (ref <130)

## 2015-06-28 NOTE — Addendum Note (Signed)
Addended by: Teddy Spike on: 06/28/2015 12:35 PM   Modules accepted: Orders

## 2015-07-06 ENCOUNTER — Telehealth: Payer: Self-pay | Admitting: *Deleted

## 2015-07-06 MED ORDER — FLUCONAZOLE 150 MG PO TABS: 150.0000 mg | ORAL_TABLET | Freq: Once | ORAL | Status: DC

## 2015-07-06 MED ORDER — METRONIDAZOLE 500 MG PO TABS: 500.0000 mg | ORAL_TABLET | Freq: Two times a day (BID) | ORAL | Status: DC

## 2015-07-06 NOTE — Telephone Encounter (Signed)
Call in prescription for Flagyl 500 mg twice a day for 7 days. Also call her in 1 prescribed for Diflucan 150 mg one by mouth.

## 2015-07-06 NOTE — Telephone Encounter (Signed)
Pt saw you on 06/23/15 was prescribed Tindamax 500 mg tablets four  today repeat in 24 hours, pt said symptoms returned yesterday with vulvar irritation and pruritus as noted on OV. Pt asked if she could have refill on Rx? Please advise

## 2015-07-06 NOTE — Telephone Encounter (Signed)
Pt aware, Rx sent. 

## 2015-07-11 ENCOUNTER — Encounter: Payer: Self-pay | Admitting: *Deleted

## 2015-07-11 ENCOUNTER — Emergency Department (INDEPENDENT_AMBULATORY_CARE_PROVIDER_SITE_OTHER)
Admission: EM | Admit: 2015-07-11 | Discharge: 2015-07-11 | Disposition: A | Discharge status: Home / Self Care | Payer: BC Managed Care – PPO | Source: Home / Self Care | Attending: Family Medicine | Admitting: Family Medicine

## 2015-07-11 DIAGNOSIS — S80862A Insect bite (nonvenomous), left lower leg, initial encounter: Secondary | ICD-10-CM

## 2015-07-11 DIAGNOSIS — S80861A Insect bite (nonvenomous), right lower leg, initial encounter: Secondary | ICD-10-CM

## 2015-07-11 DIAGNOSIS — L089 Local infection of the skin and subcutaneous tissue, unspecified: Secondary | ICD-10-CM | POA: Diagnosis not present

## 2015-07-11 DIAGNOSIS — W57XXXA Bitten or stung by nonvenomous insect and other nonvenomous arthropods, initial encounter: Principal | ICD-10-CM

## 2015-07-11 MED ORDER — PREDNISONE 20 MG PO TABS: ORAL_TABLET | ORAL | Status: DC

## 2015-07-11 MED ORDER — CEPHALEXIN 500 MG PO CAPS: 500.0000 mg | ORAL_CAPSULE | Freq: Two times a day (BID) | ORAL | Status: DC

## 2015-07-11 MED ORDER — HYDROXYZINE HCL 25 MG PO TABS: 25.0000 mg | ORAL_TABLET | Freq: Four times a day (QID) | ORAL | Status: DC | PRN

## 2015-07-11 MED ORDER — CETIRIZINE HCL 10 MG PO TABS: 10.0000 mg | ORAL_TABLET | Freq: Every day | ORAL | Status: DC

## 2015-07-11 NOTE — ED Provider Notes (Signed)
CSN: [id number]     Arrival date & time 07/11/15  1726 History   First MD Initiated Contact with Patient 07/11/15 1743     Chief Complaint  Patient presents with  . Insect Bite   (Consider location/radiation/quality/duration/timing/severity/associated sxs/prior Treatment) HPI  Pamela Holloway is a 55 y.o. female presenting to UC with c/o gradually worsening swelling and itching of bilateral legs secondary to multiple mosquito bites. Pt also c/o leg cramping due to the swelling. Symptoms are moderate in severity. She has been using rubbing alcohol on insect bites as well as took Benadryl earlier today but had difficulty staying awake. Minimal relief. Hx of local allergic reactions to insect bites but has not had this many bites before. Denies hx of anaphylaxis.  Denies oral swelling or SOB. No fever, chills, n/v/d.    Past Medical History  Diagnosis Date  . Hypertension   . Thyroid disease     Hypothyroid  . Hyperlipidemia   . Diabetes mellitus without complication [facility])    Past Surgical History  Procedure Laterality Date  . Cholecystectomy    . Tonsillectomy    . Laparoscopic gastric banding     Family History  Problem Relation Age of Onset  . Diabetes Mother   . Hypertension Mother   . Heart disease Mother   . Diabetes Father   . Hypertension Father    Social History  Substance Use Topics  . Smoking status: Former Research scientist (life sciences)  . Smokeless tobacco: Never Used  . Alcohol Use: 0.0 oz/week    0 Standard drinks or equivalent per week     Comment: rare   OB History    Gravida Para Term Preterm AB TAB SAB Ectopic Multiple Living   2 2 2       2      Review of Systems  Constitutional: Negative for fever and chills.  Respiratory: Negative for shortness of breath, wheezing and stridor.   Cardiovascular: Positive for leg swelling ( bilateral).  Musculoskeletal: Positive for myalgias, joint swelling and arthralgias.       Bilateral leg swelling and cramping  Skin: Positive for rash  and wound. Negative for color change.    Allergies  Aspirin; Atorvastatin; Invokana; and Tine test  Home Medications   Prior to Admission medications   Medication Sig Start Date End Date Taking? Authorizing Provider  amitriptyline (ELAVIL) 50 MG tablet One half tab PO qHS for a week, then one tab PO qHS. 09/10/13   Silverio Decamp, MD  cephALEXin (KEFLEX) 500 MG capsule Take 1 capsule (500 mg total) by mouth 2 (two) times daily. 07/11/15   Noland Fordyce, PA-C  cetirizine (ZYRTEC) 10 MG tablet Take 1 tablet (10 mg total) by mouth daily. 07/11/15   Noland Fordyce, PA-C  Dapagliflozin-Metformin HCl ER (XIGDUO XR) 10-998 MG TB24 Take 1 tablet by mouth daily. 06/23/15   Hali Marry, MD  diazepam (VALIUM) 5 MG tablet Take 1 tab PO 1 hour before procedure or imaging. 02/14/15   Silverio Decamp, MD  Dulaglutide (TRULICITY) A999333 [id number] SOPN Inject 0.75 mg into the skin once a week. 03/10/15   Hali Marry, MD  fluconazole (DIFLUCAN) 150 MG tablet Take 1 tablet (150 mg total) by mouth once. 07/06/15   Terrance Mass, MD  glipiZIDE (GLUCOTROL) 10 MG tablet TAKE 1 TABLET TWICE A DAY  MUST KEEP UP COMING        APPOINTMENT WITH PRIMARY   CARE PHYSICIAN 05/24/15   Hali Marry, MD  hydrochlorothiazide (HYDRODIURIL) 25 MG tablet TAKE ONE-HALF (1/2) TABLET DAILY 05/08/15   Hali Marry, MD  hydrOXYzine (ATARAX/VISTARIL) 25 MG tablet Take 1 tablet (25 mg total) by mouth every 6 (six) hours as needed for itching. 07/11/15   Noland Fordyce, PA-C  levothyroxine (SYNTHROID, LEVOTHROID) 150 MCG tablet Take 1 tablet (150 mcg total) by mouth daily before breakfast. 10/31/14   Hali Marry, MD  levothyroxine (SYNTHROID, LEVOTHROID) 50 MCG tablet Take 1 tablet (50 mcg total) by mouth every 7 (seven) days. 04/25/15   Hali Marry, MD  lidocaine-prilocaine (EMLA) cream Apply 1 application topically as needed. Before injection 06/23/15   Hali Marry, MD   losartan-hydrochlorothiazide [facility]) 100-25 MG tablet TAKE 1 TABLET DAILY 11/15/14   Hali Marry, MD  meloxicam (MOBIC) 15 MG tablet One tab PO qAM with breakfast prn pain. 06/14/15   Silverio Decamp, MD  metroNIDAZOLE (FLAGYL) 500 MG tablet Take 1 tablet (500 mg total) by mouth 2 (two) times daily. 07/06/15   Terrance Mass, MD  nystatin-triamcinolone (MYCOLOG II) cream Apply 1 application topically 3 (three) times daily. 06/23/15   Terrance Mass, MD  omega-3 acid ethyl esters (LOVAZA) 1 g capsule Take 2 capsules (2 g total) by mouth 2 (two) times daily. 06/19/15   Hali Marry, MD  pravastatin (PRAVACHOL) 40 MG tablet Take 1 tablet (40 mg total) by mouth daily. 11/24/13   Hali Marry, MD  predniSONE (DELTASONE) 20 MG tablet 3 tabs po day one, then 2 po daily x 4 days 07/11/15   Noland Fordyce, PA-C  vitamin E 100 UNIT capsule Take by mouth daily.      Historical Provider, MD   Meds Ordered and Administered this Visit  Medications - No data to display  BP 148/92 mmHg  Pulse 93  Temp(Src) 97.4 F (36.3 C) (Oral)  Resp 16  Ht 5\' 4"  (1.626 m)  Wt 197 lb (89.359 kg)  BMI 33.80 kg/m2  SpO2 100%  LMP 09/08/2011 No data found.   Physical Exam  Constitutional: She is oriented to person, place, and time. She appears well-developed and well-nourished.  HENT:  Head: Normocephalic and atraumatic.  Mouth/Throat: Oropharynx is clear and moist.  No oral swelling  Eyes: EOM are normal.  Neck: Normal range of motion.  Cardiovascular: Normal rate.   Pulses:      Dorsalis pedis pulses are 2+ on the right side, and 2+ on the left side.  Pulmonary/Chest: Effort normal. No respiratory distress.  Musculoskeletal: Normal range of motion. She exhibits edema and tenderness.  Mild to moderate bilateral edema to lower legs. Mild tenderness. Compartments are soft.   Neurological: She is alert and oriented to person, place, and time.  Skin: Skin is warm and dry. There is  erythema.  Bilateral lower legs: diffuse erythematous raised papules and pustules c/w insect bites. Some oozing clear to yellow fluid.  Mildly tender.  Psychiatric: She has a normal mood and affect. Her behavior is normal.  Nursing note and vitals reviewed.   ED Course  Procedures (including critical care time)  Labs Review Labs Reviewed - No data to display  Imaging Review No results found.    MDM   1. Infected insect bite of left leg, initial encounter   2. Infected insect bite of right leg, initial encounter   3. Insect bite of lower leg with local reaction, left, initial encounter   4. Insect bite of lower leg with local reaction, right, initial encounter  Exam c/w local reaction to insect bites with secondary skin infection. No evidence of abscesses. No evidence of anaphylaxis.   Rx: Keflex, prednisone, cetirizine for during the day, and atarax. Encouraged to keep clean with soap and water. F/u with PCP in 1 week for wound recheck if not improving. Sooner if worsening. Patient verbalized understanding and agreement with treatment plan.     Noland Fordyce, PA-C 07/11/15 1900

## 2015-07-11 NOTE — Discharge Instructions (Signed)
Atarax (hydroxizine) is an antihistamine that can be taken to help with itching. This medication can cause drowsiness so do not drive or drink alcohol while taking.     Insect Bite Mosquitoes, flies, fleas, bedbugs, and many other insects can bite. Insect bites are different from insect stings. A sting is when poison (venom) is injected into the skin. Insect bites can cause pain or itching for a few days, but they are usually not serious. Some insects can spread diseases to people through a bite. SYMPTOMS  Symptoms of an insect bite include:  Itching or pain in the bite area.  Redness and swelling in the bite area.  An open wound (skin ulcer). In many cases, symptoms last for 2-4 days.  DIAGNOSIS  This condition is usually diagnosed based on symptoms and a physical exam. TREATMENT  Treatment is usually not needed for an insect bite. Symptoms often go away on their own. Your health care provider may recommend creams or lotions to help reduce itching. Antibiotic medicines may be prescribed if the bite becomes infected. A tetanus shot may be given in some cases. If you develop an allergic reaction to an insect bite, your health care provider will prescribe medicines to treat the reaction (antihistamines). This is rare. HOME CARE INSTRUCTIONS  Do not scratch the bite area.  Keep the bite area clean and dry. Wash the bite area daily with soap and water as told by your health care provider.  If directed, applyice to the bite area.  Put ice in a plastic bag.  Place a towel between your skin and the bag.  Leave the ice on for 20 minutes, 2-3 times per day.  To help reduce itching and swelling, try applying a baking soda paste, cortisone cream, or calamine lotion to the bite area as told by your health care provider.  Apply or take over-the-counter and prescription medicines only as told by your health care provider.  If you were prescribed an antibiotic medicine, use it as told by your  health care provider. Do not stop using the antibiotic even if your condition improves.  Keep all follow-up visits as told by your health care provider. This is important. PREVENTION   Use insect repellent. The best insect repellents contain:  DEET, picaridin, oil of lemon eucalyptus (OLE), or IR3535.  Higher amounts of an active ingredient.  When you are outdoors, wear clothing that covers your arms and legs.  Avoid opening windows that do not have window screens. SEEK MEDICAL CARE IF:  You have increased redness, swelling, or pain in the bite area.  You have a fever. SEEK IMMEDIATE MEDICAL CARE IF:   You have joint pain.   You have fluid, blood, or pus coming from the bite area.  You have a headache or neck pain.  You have unusual weakness.  You have a rash.  You have chest pain or shortness of breath.  You have abdominal pain, nausea, or vomiting.  You feel unusually tired or sleepy.   This information is not intended to replace advice given to you by your health care provider. Make sure you discuss any questions you have with your health care provider.   Document Released: 02/22/2004 Document Revised: 10/05/2014 Document Reviewed: 06/01/2014 Elsevier Interactive Patient Education 2016 Elsevier Inc.  Cellulitis Cellulitis is an infection of the skin and the tissue under the skin. The infected area is usually red and tender. This happens most often in the arms and lower legs. HOME CARE  Take your antibiotic medicine as told. Finish the medicine even if you start to feel better.  Keep the infected arm or leg raised (elevated).  Put a warm cloth on the area up to 4 times per day.  Only take medicines as told by your doctor.  Keep all doctor visits as told. GET HELP IF:  You see red streaks on the skin coming from the infected area.  Your red area gets bigger or turns a dark color.  Your bone or joint under the infected area is painful after the skin  heals.  Your infection comes back in the same area or different area.  You have a puffy (swollen) bump in the infected area.  You have new symptoms.  You have a fever. GET HELP RIGHT AWAY IF:   You feel very sleepy.  You throw up (vomit) or have watery poop (diarrhea).  You feel sick and have muscle aches and pains.   This information is not intended to replace advice given to you by your health care provider. Make sure you discuss any questions you have with your health care provider.   Document Released: 07/03/2007 Document Revised: 10/05/2014 Document Reviewed: 04/01/2011 Elsevier Interactive Patient Education [employer].

## 2015-07-11 NOTE — ED Notes (Signed)
Pt c/o mosquito bites on her legs bilaterally, with swelling in her feet x 2 days.

## 2015-07-12 ENCOUNTER — Telehealth: Payer: Self-pay | Admitting: Sports Medicine

## 2015-07-12 DIAGNOSIS — M1611 Unilateral primary osteoarthritis, right hip: Secondary | ICD-10-CM

## 2015-07-12 MED ORDER — DIAZEPAM 5 MG PO TABS: ORAL_TABLET | ORAL | Status: DC

## 2015-07-12 NOTE — Telephone Encounter (Signed)
Pt is coming in for a shot in her hip on June 23 and wants to know if you can order her a valium for when she comes to get the shot bc she said it helped a lot last time. thanks

## 2015-07-12 NOTE — Telephone Encounter (Signed)
Valium in box. 

## 2015-07-19 ENCOUNTER — Telehealth: Payer: Self-pay | Admitting: *Deleted

## 2015-07-19 ENCOUNTER — Telehealth: Payer: Self-pay

## 2015-07-19 ENCOUNTER — Other Ambulatory Visit: Payer: Self-pay | Admitting: Orthopedic Surgery

## 2015-07-19 NOTE — Telephone Encounter (Signed)
Are you okay with her just coming by on lunch hour and leaving a specimen versus office visit?

## 2015-07-19 NOTE — Telephone Encounter (Signed)
History is too unusual for UTI particularly since she is on antibiotics. I would feel comfortable treating with a different antibiotic based on that history without a urine analysis.

## 2015-07-19 NOTE — Telephone Encounter (Signed)
Pt is having surgery on 08/28/2015 and is asking for a copy of her medication list either be mailed to her or emailed to [email or web address]. I told her that we could do it either way. She stated that she just needed this by 08/18/2015 that's when she goes for pre-op. List printed and given to Tammy P to email to pt. Also mailed her a copy.Audelia Hives Eunice

## 2015-07-19 NOTE — Telephone Encounter (Addendum)
Patient called stating she is taking antibiotics (Keflex 500 bid x 7 days since 07/11/15) for infected mosquito bites and now she thinks she has a UTI.  She has no time available at work to be able to come for a visit although she could run by on her lunch hour and leave a specimen.  When I asked her what symptoms she had that made her think she has uti  she tells me she has never had symptoms of UTI when we informed her she had one.  She said the only thing she has noticed is frequency that she is having to urinate every 1 1/2-2 hours.  She doesn't usually go that often.  What to rec?  (Going to have hip surgery soon and needing to save her time for this.)

## 2015-07-20 ENCOUNTER — Other Ambulatory Visit: Payer: Self-pay | Admitting: Gynecology

## 2015-07-20 ENCOUNTER — Other Ambulatory Visit: Payer: BC Managed Care – PPO

## 2015-07-20 DIAGNOSIS — R350 Frequency of micturition: Secondary | ICD-10-CM

## 2015-07-20 NOTE — Telephone Encounter (Signed)
Patient informed. She will come by at lunch today. Lab appt made and order placed.

## 2015-07-20 NOTE — Telephone Encounter (Signed)
Ok to leave specimen

## 2015-07-21 ENCOUNTER — Ambulatory Visit (INDEPENDENT_AMBULATORY_CARE_PROVIDER_SITE_OTHER): Payer: BC Managed Care – PPO | Admitting: Sports Medicine

## 2015-07-21 ENCOUNTER — Encounter: Payer: Self-pay | Admitting: Sports Medicine

## 2015-07-21 VITALS — BP 147/82 | HR 87 | Resp 18 | Wt 196.1 lb

## 2015-07-21 DIAGNOSIS — M1611 Unilateral primary osteoarthritis, right hip: Secondary | ICD-10-CM | POA: Diagnosis not present

## 2015-07-21 MED ORDER — FLUCONAZOLE 150 MG PO TABS: ORAL_TABLET | ORAL | Status: DC

## 2015-07-21 NOTE — Assessment & Plan Note (Signed)
Final injection as above, arthroplasty scheduled for next month.

## 2015-07-21 NOTE — Progress Notes (Signed)
  Subjective:    CC: Follow-up  HPI: Hip osteoarthritis: Desires repeat injection, previous injection was approximately 2-1/2 months ago, arthroplasty scheduled for next month.  Vulvovaginal candidiasis: Has had recent multiple antibiotics and steroids, usually does not respond to a single Diflucan.  Past medical history, Surgical history, Family history not pertinant except as noted below, Social history, Allergies, and medications have been entered into the medical record, reviewed, and no changes needed.   Review of Systems: No fevers, chills, night sweats, weight loss, chest pain, or shortness of breath.   Objective:    General: Well Developed, well nourished, and in no acute distress.  Neuro: Alert and oriented x3, extra-ocular muscles intact, sensation grossly intact.  HEENT: Normocephalic, atraumatic, pupils equal round reactive to light, neck supple, no masses, no lymphadenopathy, thyroid nonpalpable.  Skin: Warm and dry, no rashes. Cardiac: Regular rate and rhythm, no murmurs rubs or gallops, no lower extremity edema.  Respiratory: Clear to auscultation bilaterally. Not using accessory muscles, speaking in full sentences.  Procedure: Real-time Ultrasound Guided Injection of right hip joint Device: GE Logiq E  Verbal informed consent obtained.  Time-out conducted.  Noted no overlying erythema, induration, or other signs of local infection.  Skin prepped in a sterile fashion.  Local anesthesia: Topical Ethyl chloride.  With sterile technique and under real time ultrasound guidance:  Spinal needle advanced to the femoral head/neck junction, bone contacted and 1 mL kenalog 40, 2 mL lidocaine, 2 mL Marcaine injected easily. Completed without difficulty  Pain immediately resolved suggesting accurate placement of the medication.  Advised to call if fevers/chills, erythema, induration, drainage, or persistent bleeding.  Images permanently stored and available for review in the  ultrasound unit.  Impression: Technically successful ultrasound guided injection.  Impression and Recommendations:

## 2015-08-12 ENCOUNTER — Other Ambulatory Visit: Payer: Self-pay | Admitting: Family Medicine

## 2015-08-17 NOTE — Pre-Procedure Instructions (Signed)
Pamela Holloway  08/17/2015      [facility] [place] [facility] [place] [address] Phone: [phone] Fax: [phone]  [id number] [place], [place] [address] [place] [address] Phone: [phone] Fax: [phone]    Your procedure is scheduled on July 31  Report to [facility] at [place].M.  Call this number if you have problems the morning of surgery:  [phone]   Remember:  Do not eat food or drink liquids after midnight.   Take these medicines the morning of surgery with A SIP OF WATER : levothyroxine, pravastatin              Stop : mobic, omega 3 and vit. E 1 week prior to surgery.               How to Manage Your Diabetes Before and After Surgery  Why is it important to control my blood sugar before and after surgery? . Improving blood sugar levels before and after surgery helps healing and can limit problems. . A way of improving blood sugar control is eating a healthy diet by: o  Eating less sugar and carbohydrates o  Increasing activity/exercise o  Talking with your doctor about reaching your blood sugar goals . High blood sugars (greater than 180 mg/dL) can raise your risk of infections and slow your recovery, so you will need to focus on controlling your diabetes during the weeks before surgery. . Make sure that the doctor who takes care of your diabetes knows about your planned surgery including the date and location.  How do I manage my blood sugar before surgery? . Check your blood sugar at least 4 times a day, starting 2 days before surgery, to make sure that the level is not too high or low. o Check your blood sugar the morning of your surgery when you wake up and every 2 hours until you get to the Short Stay unit. . If your blood sugar is less than 70 mg/dL, you will need to treat for low  blood sugar: o Do not take insulin. o Treat a low blood sugar (less than 70 mg/dL) with  cup of clear juice (cranberry or apple), 4 glucose tablets, OR glucose gel. o Recheck blood sugar in 15 minutes after treatment (to make sure it is greater than 70 mg/dL). If your blood sugar is not greater than 70 mg/dL on recheck, call [phone] for further instructions. . Report your blood sugar to the short stay nurse when you get to Short Stay.  . If you are admitted to the hospital after surgery: o Your blood sugar will be checked by the staff and you will probably be given insulin after surgery (instead of oral diabetes medicines) to make sure you have good blood sugar levels. o The goal for blood sugar control after surgery is 80-180 mg/dL.              WHAT DO I DO ABOUT MY DIABETES MEDICATION?   Marland Kitchen Do not take oral diabetes medicines (pills) the morning of surgery.                Do not wear jewelry, make-up or nail polish.  Do not wear lotions, powders, or perfumes.  You may NOT wear deoderant.  Do not shave 48 hours prior to surgery.  Do not bring valuables to the hospital.  [facility] is not responsible for any belongings or valuables.  Contacts, dentures or bridgework may not be worn into surgery.  Leave your suitcase in the car.  After surgery it may be brought to your room.  For patients admitted to the hospital, discharge time will be determined by your treatment team.  Patients discharged the day of surgery will not be allowed to drive home.    Special instructions:   [facility]- Preparing For Surgery  Before surgery, you can play an important role. Because skin is not sterile, your skin needs to be as free of germs as possible. You can reduce the number of germs on your skin by washing with CHG (chlorahexidine gluconate) Soap before surgery.  CHG is an antiseptic cleaner which kills germs and bonds with the skin to continue killing germs even after  washing.  Please do not use if you have an allergy to CHG or antibacterial soaps. If your skin becomes reddened/irritated stop using the CHG.  Do not shave (including legs and underarms) for at least 48 hours prior to first CHG shower. It is OK to shave your face.  Please follow these instructions carefully.   1. Shower the NIGHT BEFORE SURGERY and the MORNING OF SURGERY with CHG.   2. If you chose to wash your hair, wash your hair first as usual with your normal shampoo.  3. After you shampoo, rinse your hair and body thoroughly to remove the shampoo.  4. Use CHG as you would any other liquid soap. You can apply CHG directly to the skin and wash gently with a scrungie or a clean washcloth.   5. Apply the CHG Soap to your body ONLY FROM THE NECK DOWN.  Do not use on open wounds or open sores. Avoid contact with your eyes, ears, mouth and genitals (private parts). Wash genitals (private parts) with your normal soap.  6. Wash thoroughly, paying special attention to the area where your surgery will be performed.  7. Thoroughly rinse your body with warm water from the neck down.  8. DO NOT shower/wash with your normal soap after using and rinsing off the CHG Soap.  9. Pat yourself dry with a CLEAN TOWEL.   10. Wear CLEAN PAJAMAS   11. Place CLEAN SHEETS on your bed the night of your first shower and DO NOT SLEEP WITH PETS.    Day of Surgery: Do not apply any deodorants/lotions. Please wear clean clothes to the hospital/surgery center.      Please read over the following fact sheets that you were given. MRSA Information

## 2015-08-18 ENCOUNTER — Encounter (HOSPITAL_COMMUNITY)
Admission: RE | Admit: 2015-08-18 | Discharge: 2015-08-18 | Disposition: A | Discharge status: Home / Self Care | Payer: BC Managed Care – PPO | Source: Ambulatory Visit | Attending: Orthopedic Surgery | Admitting: Orthopedic Surgery

## 2015-08-18 ENCOUNTER — Encounter (HOSPITAL_COMMUNITY): Payer: Self-pay

## 2015-08-18 ENCOUNTER — Ambulatory Visit (HOSPITAL_COMMUNITY)
Admission: RE | Admit: 2015-08-18 | Discharge: 2015-08-18 | Disposition: A | Discharge status: Home / Self Care | Payer: BC Managed Care – PPO | Source: Ambulatory Visit | Attending: Orthopedic Surgery | Admitting: Orthopedic Surgery

## 2015-08-18 DIAGNOSIS — Z01818 Encounter for other preprocedural examination: Secondary | ICD-10-CM

## 2015-08-18 DIAGNOSIS — I700 Atherosclerosis of aorta: Secondary | ICD-10-CM | POA: Diagnosis not present

## 2015-08-18 DIAGNOSIS — Z0183 Encounter for blood typing: Secondary | ICD-10-CM | POA: Diagnosis not present

## 2015-08-18 DIAGNOSIS — M1611 Unilateral primary osteoarthritis, right hip: Secondary | ICD-10-CM | POA: Insufficient documentation

## 2015-08-18 DIAGNOSIS — Z01812 Encounter for preprocedural laboratory examination: Secondary | ICD-10-CM | POA: Insufficient documentation

## 2015-08-18 HISTORY — DX: Inflammatory liver disease, unspecified: K75.9

## 2015-08-18 HISTORY — DX: Family history of other specified conditions: Z84.89

## 2015-08-18 HISTORY — DX: Hypothyroidism, unspecified: E03.9

## 2015-08-18 LAB — HEPATIC FUNCTION PANEL
ALBUMIN: 4.4 g/dL (ref 3.5–5.0)
ALT: 38 U/L (ref 14–54)
AST: 34 U/L (ref 15–41)
Alkaline Phosphatase: 103 U/L (ref 38–126)
BILIRUBIN TOTAL: 1.7 mg/dL — AB (ref 0.3–1.2)
Bilirubin, Direct: 0.3 mg/dL (ref 0.1–0.5)
Indirect Bilirubin: 1.4 mg/dL — ABNORMAL HIGH (ref 0.3–0.9)
TOTAL PROTEIN: 7.5 g/dL (ref 6.5–8.1)

## 2015-08-18 LAB — URINE MICROSCOPIC-ADD ON

## 2015-08-18 LAB — CBC WITH DIFFERENTIAL/PLATELET
Basophils Absolute: 0 10*3/uL (ref 0.0–0.1)
Basophils Relative: 1 %
EOS PCT: 2 %
Eosinophils Absolute: 0.2 10*3/uL (ref 0.0–0.7)
HEMATOCRIT: 51.8 % — AB (ref 36.0–46.0)
Hemoglobin: 17.7 g/dL — ABNORMAL HIGH (ref 12.0–15.0)
LYMPHS ABS: 3 10*3/uL (ref 0.7–4.0)
LYMPHS PCT: 36 %
MCH: 29.9 pg (ref 26.0–34.0)
MCHC: 34.2 g/dL (ref 30.0–36.0)
MCV: 87.5 fL (ref 78.0–100.0)
MONO ABS: 0.5 10*3/uL (ref 0.1–1.0)
MONOS PCT: 6 %
NEUTROS ABS: 4.6 10*3/uL (ref 1.7–7.7)
Neutrophils Relative %: 55 %
PLATELETS: 211 10*3/uL (ref 150–400)
RBC: 5.92 MIL/uL — ABNORMAL HIGH (ref 3.87–5.11)
RDW: 14.8 % (ref 11.5–15.5)
WBC: 8.2 10*3/uL (ref 4.0–10.5)

## 2015-08-18 LAB — GLUCOSE, CAPILLARY: GLUCOSE-CAPILLARY: 140 mg/dL — AB (ref 65–99)

## 2015-08-18 LAB — URINALYSIS, ROUTINE W REFLEX MICROSCOPIC
Bilirubin Urine: NEGATIVE
Glucose, UA: >1000 mg/dL — AB
HGB URINE DIPSTICK: NEGATIVE
Ketones, ur: NEGATIVE mg/dL
LEUKOCYTES UA: NEGATIVE
NITRITE: NEGATIVE
PROTEIN: NEGATIVE mg/dL
Specific Gravity, Urine: 1.039 — ABNORMAL HIGH (ref 1.005–1.030)
pH: 5.5 (ref 5.0–8.0)

## 2015-08-18 LAB — TYPE AND SCREEN
ABO/RH(D): O POS
Antibody Screen: NEGATIVE

## 2015-08-18 LAB — BASIC METABOLIC PANEL
ANION GAP: 9 (ref 5–15)
BUN: 12 mg/dL (ref 6–20)
CO2: 27 mmol/L (ref 22–32)
Calcium: 10 mg/dL (ref 8.9–10.3)
Chloride: 103 mmol/L (ref 101–111)
Creatinine, Ser: 0.79 mg/dL (ref 0.44–1.00)
GFR calc Af Amer: >60 mL/min (ref >60)
GFR calc non Af Amer: >60 mL/min (ref >60)
GLUCOSE: 144 mg/dL — AB (ref 65–99)
POTASSIUM: 3.7 mmol/L (ref 3.5–5.1)
Sodium: 139 mmol/L (ref 135–145)

## 2015-08-18 LAB — SURGICAL PCR SCREEN
MRSA, PCR: NEGATIVE
STAPHYLOCOCCUS AUREUS: POSITIVE — AB

## 2015-08-18 LAB — APTT: APTT: 33 s (ref 24–37)

## 2015-08-18 LAB — PROTIME-INR
INR: 1 (ref 0.00–1.49)
Prothrombin Time: 13.4 seconds (ref 11.6–15.2)

## 2015-08-18 LAB — ABO/RH: ABO/RH(D): O POS

## 2015-08-18 NOTE — Progress Notes (Addendum)
[id number] [place] @ [place] Fasting blood sugars 120  Notified pt. Of positive PCR. [facility] [place] [facility] [place] [facility] [place].

## 2015-08-19 LAB — HEMOGLOBIN A1C
HEMOGLOBIN A1C: 7.8 % — AB (ref 4.8–5.6)
Mean Plasma Glucose: 177 mg/dL

## 2015-08-22 NOTE — Progress Notes (Signed)
Anesthesia Chart Review: Patient is a 55 year old female scheduled for right THA, anterior approach on 08/28/15 by Dr. Mayer Camel.  History includes former smoker, HTN, HLD, hypothyroidism, HLD, DM2, hepatitis C, cholecystectomy, tonsillectomy, laparoscopic gastric banding. BMI is consistent with obesity. PCP is Dr. Beatrice Lecher, last visit 06/23/15, and she is aware of plans for surgery.  Meds include Elavil, Zyrtec, Xigduo, Trulicity, glipizide, HCTZ, levothyroxine, Hyzaar, Lovaza, pravastatin.  08/18/15 EKG: NSR.  08/18/15 CXR: IMPRESSION: There is no active cardiopulmonary disease. Aortic atherosclerosis.  Preoperative labs noted. Cr 0.79. Total bilirubin 1.7 (indirect 1.4), AST 34, ALT 38. H/H 17.7/51.8, PLT 211. PT/PTT WNL. Glucose 144. A1c 7.8. UA/micro cloudy with many bacteria, but negative for leukocytes and nitrites. Only 0-5 WBC but many squamous epithelials. Voice message left with Juliann Pulse at Dr. Damita Dunnings office regarding urine results.  If no acute changes then I anticipate that she can proceed as planned from an anesthesia standpoint.  George Hugh [facility] Short Stay Center/Anesthesiology Phone [phone] 08/22/2015 6:31 PM

## 2015-08-23 ENCOUNTER — Other Ambulatory Visit: Payer: Self-pay | Admitting: *Deleted

## 2015-08-23 MED ORDER — PRAVASTATIN SODIUM 40 MG PO TABS: 40.0000 mg | ORAL_TABLET | Freq: Every day | ORAL | 3 refills | Status: DC

## 2015-08-25 NOTE — H&P (Signed)
TOTAL HIP ADMISSION H&P  Patient is admitted for right total hip arthroplasty.  Subjective:  Chief Complaint: right hip pain  HPI: Pamela Holloway, 55 y.o. female, has a history of pain and functional disability in the right hip(s) due to arthritis and patient has failed non-surgical conservative treatments for greater than 12 weeks to include NSAID's and/or analgesics, corticosteriod injections, flexibility and strengthening excercises, use of assistive devices, weight reduction as appropriate and activity modification.  Onset of symptoms was gradual starting several years ago with gradually worsening course since that time.The patient noted no past surgery on the right hip(s).  Patient currently rates pain in the right hip at 10 out of 10 with activity. Patient has night pain, worsening of pain with activity and weight bearing, pain that interfers with activities of daily living, pain with passive range of motion and crepitus. Patient has evidence of joint space narrowing by imaging studies. This condition presents safety issues increasing the risk of falls. There is no current active infection.  Patient Active Problem List   Diagnosis Date Noted  . Cubital tunnel syndrome on right 12/05/2014  . Left cervical radiculopathy 10/28/2014  . Degenerative disc disease, cervical 09/10/2013  . Thoracic degenerative disc disease 09/10/2013  . Osteoarthritis of right hip 08/20/2013  . Right lumbar radiculopathy 08/20/2013  . 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 Diabetes mellitus type 2, uncontrolled, without complications ([place]) [id number]  . History of hepatitis C virus infection 08/09/2008  . HYPERTENSION, BENIGN 07/05/2008  . Bariatric surgery status 06/29/2008  . Hypothyroidism 11/03/2006  . Hyperlipidemia 11/03/2006  . COMMON MIGRAINE 11/03/2006   Past Medical History:  Diagnosis Date  . Diabetes mellitus without complication ([place])   . Family history of adverse reaction to anesthesia    son had  n/v  . Hepatitis    h/o hep C, 2012 no signs  . Hyperlipidemia   . Hypertension   . Hypothyroidism   . Thyroid disease    Hypothyroid    Past Surgical History:  Procedure Laterality Date  . CHOLECYSTECTOMY    . LAPAROSCOPIC GASTRIC BANDING    . TONSILLECTOMY      No prescriptions prior to admission.   Allergies  Allergen Reactions  . Aspirin Swelling    REACTION: lips swell  . Atorvastatin Other (See Comments)    Myalgia   . Invokana [Canagliflozin] Other (See Comments)    Recurrent UTI  . Tine Test [Old Tuberculin] Other (See Comments)    Social History  Substance Use Topics  . Smoking status: Former Smoker    Packs/day: 2.00    Years: 12.00    Types: Cigarettes  . Smokeless tobacco: Never Used  . Alcohol use 0.0 oz/week     Comment: rare    Family History  Problem Relation Age of Onset  . Diabetes Mother   . Hypertension Mother   . Heart disease Mother   . Diabetes Father   . Hypertension Father      Review of Systems  Constitutional: Negative.   HENT: Negative.   Eyes: Negative.   Respiratory: Negative.   Cardiovascular: Negative.   Gastrointestinal: Negative.   Genitourinary: Negative.   Musculoskeletal: Positive for joint pain and myalgias.  Skin: Negative.   Neurological: Negative.   Endo/Heme/Allergies:       Hot flashes and over active thyroid  Psychiatric/Behavioral: Negative.     Objective:  Physical Exam  Constitutional: She is oriented to person, place, and time. She appears well-developed and well-nourished.  HENT:  Head: Normocephalic and atraumatic.  Eyes: Pupils are equal, round, and reactive to light.  Neck: Normal range of motion. Neck supple.  Cardiovascular: Intact distal pulses.   Respiratory: Effort normal.  Musculoskeletal: She exhibits tenderness.  the patient's left hip has good strength good range of motion and no pain.  Patient's right hip does have severe pain with internal rotation.  She has only approximately 5  to 10 of internal rotation.  Tenderness with palpation of the right groin.  Mild pain with heeltap.  Her calves are soft and nontender bilaterally.  She is neurovascularly intact distally.  Neurological: She is alert and oriented to person, place, and time.  Skin: Skin is warm and dry.  Psychiatric: She has a normal mood and affect. Her behavior is normal. Judgment and thought content normal.    Vital signs in last 24 hours:    Labs:   Estimated body mass index is 33.3 kg/m as calculated from the following:   Height as of 08/18/15: 5\' 4"  (1.626 m).   Weight as of 08/18/15: 88 kg (194 lb).   Imaging Review Plain radiographs demonstrate AP of the pelvis and crosstable lateral view of the right hip are taken and reviewed in office today.  Patient's right hip does have end-stage bone-on-bone arthritis.   Assessment/Plan:  End stage arthritis, right hip(s)  The patient history, physical examination, clinical judgement of the provider and imaging studies are consistent with end stage degenerative joint disease of the right hip(s) and total hip arthroplasty is deemed medically necessary. The treatment options including medical management, injection therapy, arthroscopy and arthroplasty were discussed at length. The risks and benefits of total hip arthroplasty were presented and reviewed. The risks due to aseptic loosening, infection, stiffness, dislocation/subluxation,  thromboembolic complications and other imponderables were discussed.  The patient acknowledged the explanation, agreed to proceed with the plan and consent was signed. Patient is being admitted for inpatient treatment for surgery, pain control, PT, OT, prophylactic antibiotics, VTE prophylaxis, progressive ambulation and ADL's and discharge planning.The patient is planning to be discharged home with home health services

## 2015-08-27 MED ORDER — CEFAZOLIN SODIUM-DEXTROSE 2-4 GM/100ML-% IV SOLN
2.0000 g | INTRAVENOUS | Status: AC
  Administered 2015-08-28: 2 g via INTRAVENOUS
  Filled 2015-08-27: qty 100

## 2015-08-28 ENCOUNTER — Inpatient Hospital Stay (HOSPITAL_COMMUNITY): Payer: BC Managed Care – PPO | Admitting: Anesthesiology

## 2015-08-28 ENCOUNTER — Encounter (HOSPITAL_COMMUNITY)
Admission: RE | Disposition: A | Discharge status: Home / Self Care | Payer: Self-pay | Source: Ambulatory Visit | Attending: Orthopedic Surgery

## 2015-08-28 ENCOUNTER — Encounter (HOSPITAL_COMMUNITY): Payer: Self-pay | Admitting: *Deleted

## 2015-08-28 ENCOUNTER — Inpatient Hospital Stay (HOSPITAL_COMMUNITY): Payer: BC Managed Care – PPO

## 2015-08-28 ENCOUNTER — Inpatient Hospital Stay (HOSPITAL_COMMUNITY): Payer: BC Managed Care – PPO | Admitting: Vascular Surgery

## 2015-08-28 ENCOUNTER — Inpatient Hospital Stay (HOSPITAL_COMMUNITY)
Admission: RE | Admit: 2015-08-28 | Discharge: 2015-08-29 | DRG: 470 | Disposition: A | Discharge status: Home / Self Care | Payer: BC Managed Care – PPO | Source: Ambulatory Visit | Attending: Orthopedic Surgery | Admitting: Orthopedic Surgery

## 2015-08-28 DIAGNOSIS — Z9884 Bariatric surgery status: Secondary | ICD-10-CM | POA: Diagnosis not present

## 2015-08-28 DIAGNOSIS — Z419 Encounter for procedure for purposes other than remedying health state, unspecified: Secondary | ICD-10-CM

## 2015-08-28 DIAGNOSIS — D62 Acute posthemorrhagic anemia: Secondary | ICD-10-CM | POA: Diagnosis not present

## 2015-08-28 DIAGNOSIS — Z888 Allergy status to other drugs, medicaments and biological substances status: Secondary | ICD-10-CM

## 2015-08-28 DIAGNOSIS — E119 Type 2 diabetes mellitus without complications: Secondary | ICD-10-CM | POA: Diagnosis present

## 2015-08-28 DIAGNOSIS — Z886 Allergy status to analgesic agent status: Secondary | ICD-10-CM

## 2015-08-28 DIAGNOSIS — E039 Hypothyroidism, unspecified: Secondary | ICD-10-CM | POA: Diagnosis present

## 2015-08-28 DIAGNOSIS — E785 Hyperlipidemia, unspecified: Secondary | ICD-10-CM | POA: Diagnosis present

## 2015-08-28 DIAGNOSIS — Z8249 Family history of ischemic heart disease and other diseases of the circulatory system: Secondary | ICD-10-CM

## 2015-08-28 DIAGNOSIS — Z833 Family history of diabetes mellitus: Secondary | ICD-10-CM | POA: Diagnosis not present

## 2015-08-28 DIAGNOSIS — Z87891 Personal history of nicotine dependence: Secondary | ICD-10-CM

## 2015-08-28 DIAGNOSIS — M1611 Unilateral primary osteoarthritis, right hip: Secondary | ICD-10-CM | POA: Diagnosis present

## 2015-08-28 DIAGNOSIS — I10 Essential (primary) hypertension: Secondary | ICD-10-CM | POA: Diagnosis present

## 2015-08-28 HISTORY — PX: TOTAL HIP ARTHROPLASTY: SHX124

## 2015-08-28 LAB — GLUCOSE, CAPILLARY
GLUCOSE-CAPILLARY: 236 mg/dL — AB (ref 65–99)
Glucose-Capillary: 228 mg/dL — ABNORMAL HIGH (ref 65–99)
Glucose-Capillary: 315 mg/dL — ABNORMAL HIGH (ref 65–99)
Glucose-Capillary: 298 mg/dL — ABNORMAL HIGH (ref 65–99)

## 2015-08-28 SURGERY — ARTHROPLASTY, HIP, TOTAL, ANTERIOR APPROACH
Anesthesia: Spinal | Site: Hip | Laterality: Right

## 2015-08-28 MED ORDER — LORATADINE 10 MG PO TABS
10.0000 mg | ORAL_TABLET | Freq: Every day | ORAL | Status: DC
  Administered 2015-08-29: 10 mg via ORAL
  Filled 2015-08-28 (×2): qty 1

## 2015-08-28 MED ORDER — TRANEXAMIC ACID 1000 MG/10ML IV SOLN
1000.0000 mg | Freq: Once | INTRAVENOUS | Status: AC
  Administered 2015-08-28: 1000 mg via INTRAVENOUS
  Filled 2015-08-28: qty 10

## 2015-08-28 MED ORDER — LACTATED RINGERS IV SOLN
INTRAVENOUS | Status: DC | PRN
  Administered 2015-08-28 (×2): via INTRAVENOUS

## 2015-08-28 MED ORDER — ALUMINUM HYDROXIDE GEL 320 MG/5ML PO SUSP
15.0000 mL | ORAL | Status: DC | PRN
  Filled 2015-08-28: qty 30

## 2015-08-28 MED ORDER — FENTANYL CITRATE (PF) 250 MCG/5ML IJ SOLN
INTRAMUSCULAR | Status: DC | PRN
  Administered 2015-08-28: 50 ug via INTRAVENOUS
  Administered 2015-08-28: 100 ug via INTRAVENOUS
  Administered 2015-08-28 (×3): 50 ug via INTRAVENOUS

## 2015-08-28 MED ORDER — PROPOFOL 10 MG/ML IV BOLUS
INTRAVENOUS | Status: AC
  Filled 2015-08-28: qty 20

## 2015-08-28 MED ORDER — ONDANSETRON HCL 4 MG/2ML IJ SOLN
4.0000 mg | Freq: Four times a day (QID) | INTRAMUSCULAR | Status: DC | PRN
  Filled 2015-08-28: qty 2

## 2015-08-28 MED ORDER — LEVOTHYROXINE SODIUM 50 MCG PO TABS: 50.0000 ug | ORAL_TABLET | ORAL | Status: DC

## 2015-08-28 MED ORDER — ROCURONIUM BROMIDE 100 MG/10ML IV SOLN
INTRAVENOUS | Status: DC | PRN
  Administered 2015-08-28: 50 mg via INTRAVENOUS
  Administered 2015-08-28: 10 mg via INTRAVENOUS

## 2015-08-28 MED ORDER — ACETAMINOPHEN 650 MG RE SUPP: 650.0000 mg | Freq: Four times a day (QID) | RECTAL | Status: DC | PRN

## 2015-08-28 MED ORDER — TRANEXAMIC ACID 1000 MG/10ML IV SOLN
INTRAVENOUS | Status: DC | PRN
  Administered 2015-08-28: 2000 mg via INTRAVENOUS

## 2015-08-28 MED ORDER — ROCURONIUM BROMIDE 50 MG/5ML IV SOLN
INTRAVENOUS | Status: AC
  Filled 2015-08-28: qty 1

## 2015-08-28 MED ORDER — PRAVASTATIN SODIUM 40 MG PO TABS
40.0000 mg | ORAL_TABLET | Freq: Every day | ORAL | Status: DC
  Administered 2015-08-29: 40 mg via ORAL
  Filled 2015-08-28: qty 1

## 2015-08-28 MED ORDER — AMITRIPTYLINE HCL 25 MG PO TABS: 25.0000 mg | ORAL_TABLET | Freq: Every evening | ORAL | Status: DC | PRN

## 2015-08-28 MED ORDER — HYDROMORPHONE HCL 1 MG/ML IJ SOLN
0.2500 mg | INTRAMUSCULAR | Status: DC | PRN
  Administered 2015-08-28: 1 mg via INTRAVENOUS

## 2015-08-28 MED ORDER — OXYCODONE-ACETAMINOPHEN 5-325 MG PO TABS: 1.0000 | ORAL_TABLET | ORAL | 0 refills | Status: DC | PRN

## 2015-08-28 MED ORDER — 0.9 % SODIUM CHLORIDE (POUR BTL) OPTIME
TOPICAL | Status: DC | PRN
  Administered 2015-08-28: 1000 mL

## 2015-08-28 MED ORDER — METOCLOPRAMIDE HCL 5 MG PO TABS: 5.0000 mg | ORAL_TABLET | Freq: Three times a day (TID) | ORAL | Status: DC | PRN

## 2015-08-28 MED ORDER — DEXAMETHASONE SODIUM PHOSPHATE 10 MG/ML IJ SOLN
INTRAMUSCULAR | Status: DC | PRN
  Administered 2015-08-28: 10 mg via INTRAVENOUS

## 2015-08-28 MED ORDER — FLEET ENEMA 7-19 GM/118ML RE ENEM: 1.0000 | ENEMA | Freq: Once | RECTAL | Status: DC | PRN

## 2015-08-28 MED ORDER — GLIPIZIDE 5 MG PO TABS
10.0000 mg | ORAL_TABLET | Freq: Two times a day (BID) | ORAL | Status: DC
  Administered 2015-08-28 – 2015-08-29 (×2): 10 mg via ORAL
  Filled 2015-08-28 (×2): qty 2

## 2015-08-28 MED ORDER — OXYCODONE HCL 5 MG PO TABS
5.0000 mg | ORAL_TABLET | ORAL | Status: DC | PRN
  Administered 2015-08-28 – 2015-08-29 (×5): 10 mg via ORAL
  Filled 2015-08-28 (×6): qty 2

## 2015-08-28 MED ORDER — HYDROMORPHONE HCL 1 MG/ML IJ SOLN
INTRAMUSCULAR | Status: AC
  Filled 2015-08-28: qty 1

## 2015-08-28 MED ORDER — PHENYLEPHRINE HCL 10 MG/ML IJ SOLN
INTRAMUSCULAR | Status: DC | PRN
  Administered 2015-08-28: 40 ug via INTRAVENOUS
  Administered 2015-08-28: 80 ug via INTRAVENOUS
  Administered 2015-08-28 (×2): 40 ug via INTRAVENOUS
  Administered 2015-08-28: 80 ug via INTRAVENOUS

## 2015-08-28 MED ORDER — INSULIN ASPART 100 UNIT/ML ~~LOC~~ SOLN
0.0000 [IU] | Freq: Three times a day (TID) | SUBCUTANEOUS | Status: DC
  Administered 2015-08-28: 11 [IU] via SUBCUTANEOUS
  Administered 2015-08-29 (×2): 5 [IU] via SUBCUTANEOUS

## 2015-08-28 MED ORDER — METOCLOPRAMIDE HCL 5 MG/ML IJ SOLN: 5.0000 mg | Freq: Three times a day (TID) | INTRAMUSCULAR | Status: DC | PRN

## 2015-08-28 MED ORDER — ACETAMINOPHEN 325 MG PO TABS
650.0000 mg | ORAL_TABLET | Freq: Four times a day (QID) | ORAL | Status: DC | PRN
  Administered 2015-08-28: 650 mg via ORAL
  Filled 2015-08-28: qty 2

## 2015-08-28 MED ORDER — CHLORHEXIDINE GLUCONATE 4 % EX LIQD: 60.0000 mL | Freq: Once | CUTANEOUS | Status: DC

## 2015-08-28 MED ORDER — APIXABAN 2.5 MG PO TABS
2.5000 mg | ORAL_TABLET | Freq: Two times a day (BID) | ORAL | Status: DC
  Administered 2015-08-29: 2.5 mg via ORAL
  Filled 2015-08-28: qty 1

## 2015-08-28 MED ORDER — DOCUSATE SODIUM 100 MG PO CAPS
100.0000 mg | ORAL_CAPSULE | Freq: Two times a day (BID) | ORAL | Status: DC
  Administered 2015-08-28 – 2015-08-29 (×3): 100 mg via ORAL
  Filled 2015-08-28 (×3): qty 1

## 2015-08-28 MED ORDER — BUPIVACAINE-EPINEPHRINE (PF) 0.25% -1:200000 IJ SOLN
INTRAMUSCULAR | Status: AC
  Filled 2015-08-28: qty 60

## 2015-08-28 MED ORDER — TRANEXAMIC ACID 1000 MG/10ML IV SOLN
1000.0000 mg | INTRAVENOUS | Status: AC
  Administered 2015-08-28: 1000 mg via INTRAVENOUS
  Filled 2015-08-28: qty 10

## 2015-08-28 MED ORDER — PROPOFOL 10 MG/ML IV BOLUS
INTRAVENOUS | Status: DC | PRN
  Administered 2015-08-28: 200 mg via INTRAVENOUS
  Administered 2015-08-28: 40 mg via INTRAVENOUS

## 2015-08-28 MED ORDER — NEOSTIGMINE METHYLSULFATE 10 MG/10ML IV SOLN
INTRAVENOUS | Status: DC | PRN
  Administered 2015-08-28: 4 mg via INTRAVENOUS

## 2015-08-28 MED ORDER — BUPIVACAINE LIPOSOME 1.3 % IJ SUSP
INTRAMUSCULAR | Status: DC | PRN
  Administered 2015-08-28: 20 mL

## 2015-08-28 MED ORDER — PROMETHAZINE HCL 25 MG/ML IJ SOLN: 6.2500 mg | INTRAMUSCULAR | Status: DC | PRN

## 2015-08-28 MED ORDER — ONDANSETRON HCL 4 MG/2ML IJ SOLN
INTRAMUSCULAR | Status: DC | PRN
  Administered 2015-08-28: 4 mg via INTRAVENOUS

## 2015-08-28 MED ORDER — METHOCARBAMOL 500 MG PO TABS: 500.0000 mg | ORAL_TABLET | Freq: Two times a day (BID) | ORAL | 0 refills | Status: DC

## 2015-08-28 MED ORDER — LOSARTAN POTASSIUM-HCTZ 100-25 MG PO TABS: 1.0000 | ORAL_TABLET | Freq: Every day | ORAL | Status: DC

## 2015-08-28 MED ORDER — HYDROCHLOROTHIAZIDE 12.5 MG PO CAPS
37.5000 mg | ORAL_CAPSULE | Freq: Every day | ORAL | Status: DC
  Administered 2015-08-28 – 2015-08-29 (×2): 37.5 mg via ORAL
  Filled 2015-08-28 (×2): qty 3

## 2015-08-28 MED ORDER — DULAGLUTIDE 0.75 MG/0.5ML ~~LOC~~ SOAJ: 0.7500 mg | SUBCUTANEOUS | Status: DC

## 2015-08-28 MED ORDER — MENTHOL 3 MG MT LOZG: 1.0000 | LOZENGE | OROMUCOSAL | Status: DC | PRN

## 2015-08-28 MED ORDER — TRANEXAMIC ACID 1000 MG/10ML IV SOLN
2000.0000 mg | Freq: Once | INTRAVENOUS | Status: DC
  Filled 2015-08-28: qty 20

## 2015-08-28 MED ORDER — PHENYLEPHRINE 40 MCG/ML (10ML) SYRINGE FOR IV PUSH (FOR BLOOD PRESSURE SUPPORT)
PREFILLED_SYRINGE | INTRAVENOUS | Status: AC
  Filled 2015-08-28: qty 10

## 2015-08-28 MED ORDER — FENTANYL CITRATE (PF) 250 MCG/5ML IJ SOLN
INTRAMUSCULAR | Status: AC
  Filled 2015-08-28: qty 5

## 2015-08-28 MED ORDER — BUPIVACAINE-EPINEPHRINE 0.25% -1:200000 IJ SOLN
INTRAMUSCULAR | Status: DC | PRN
  Administered 2015-08-28: 50 mL

## 2015-08-28 MED ORDER — HYDROMORPHONE HCL 1 MG/ML IJ SOLN
0.5000 mg | INTRAMUSCULAR | Status: DC | PRN
  Administered 2015-08-29: 1 mg via INTRAVENOUS
  Filled 2015-08-28: qty 1

## 2015-08-28 MED ORDER — DAPAGLIFLOZIN PRO-METFORMIN ER 10-1000 MG PO TB24: 1.0000 | ORAL_TABLET | Freq: Every day | ORAL | Status: DC

## 2015-08-28 MED ORDER — DIPHENHYDRAMINE HCL 12.5 MG/5ML PO ELIX: 12.5000 mg | ORAL_SOLUTION | ORAL | Status: DC | PRN

## 2015-08-28 MED ORDER — DEXAMETHASONE SODIUM PHOSPHATE 10 MG/ML IJ SOLN
10.0000 mg | Freq: Once | INTRAMUSCULAR | Status: AC
  Administered 2015-08-29: 10 mg via INTRAVENOUS
  Filled 2015-08-28: qty 1

## 2015-08-28 MED ORDER — BISACODYL 5 MG PO TBEC: 5.0000 mg | DELAYED_RELEASE_TABLET | Freq: Every day | ORAL | Status: DC | PRN

## 2015-08-28 MED ORDER — ONDANSETRON HCL 4 MG PO TABS: 4.0000 mg | ORAL_TABLET | Freq: Four times a day (QID) | ORAL | Status: DC | PRN

## 2015-08-28 MED ORDER — GLYCOPYRROLATE 0.2 MG/ML IJ SOLN
INTRAMUSCULAR | Status: DC | PRN
  Administered 2015-08-28: 0.6 mg via INTRAVENOUS

## 2015-08-28 MED ORDER — LOSARTAN POTASSIUM 50 MG PO TABS
100.0000 mg | ORAL_TABLET | Freq: Every day | ORAL | Status: DC
  Administered 2015-08-28 – 2015-08-29 (×2): 100 mg via ORAL
  Filled 2015-08-28 (×2): qty 2

## 2015-08-28 MED ORDER — METHOCARBAMOL 1000 MG/10ML IJ SOLN
500.0000 mg | Freq: Four times a day (QID) | INTRAVENOUS | Status: DC | PRN
  Filled 2015-08-28: qty 5

## 2015-08-28 MED ORDER — HYDROCHLOROTHIAZIDE 25 MG PO TABS: 12.5000 mg | ORAL_TABLET | Freq: Every day | ORAL | Status: DC

## 2015-08-28 MED ORDER — MIDAZOLAM HCL 5 MG/5ML IJ SOLN
INTRAMUSCULAR | Status: DC | PRN
  Administered 2015-08-28: 2 mg via INTRAVENOUS

## 2015-08-28 MED ORDER — METHOCARBAMOL 1000 MG/10ML IJ SOLN
500.0000 mg | INTRAVENOUS | Status: AC
  Administered 2015-08-28: 500 mg via INTRAVENOUS
  Filled 2015-08-28: qty 5

## 2015-08-28 MED ORDER — PHENOL 1.4 % MT LIQD: 1.0000 | OROMUCOSAL | Status: DC | PRN

## 2015-08-28 MED ORDER — DEXTROSE-NACL 5-0.45 % IV SOLN: INTRAVENOUS | Status: DC

## 2015-08-28 MED ORDER — BUPIVACAINE LIPOSOME 1.3 % IJ SUSP
20.0000 mL | Freq: Once | INTRAMUSCULAR | Status: DC
  Filled 2015-08-28: qty 20

## 2015-08-28 MED ORDER — APIXABAN 2.5 MG PO TABS: 2.5000 mg | ORAL_TABLET | Freq: Two times a day (BID) | ORAL | 0 refills | Status: DC

## 2015-08-28 MED ORDER — HYDROXYZINE HCL 25 MG PO TABS: 25.0000 mg | ORAL_TABLET | Freq: Four times a day (QID) | ORAL | Status: DC | PRN

## 2015-08-28 MED ORDER — KCL IN DEXTROSE-NACL 20-5-0.45 MEQ/L-%-% IV SOLN
INTRAVENOUS | Status: DC
  Administered 2015-08-28: 23:00:00 via INTRAVENOUS
  Filled 2015-08-28: qty 1000

## 2015-08-28 MED ORDER — LIDOCAINE-PRILOCAINE 2.5-2.5 % EX CREA
1.0000 | TOPICAL_CREAM | CUTANEOUS | Status: DC | PRN
Start: 2015-08-28 — End: 2015-08-29
  Filled 2015-08-28: qty 5

## 2015-08-28 MED ORDER — ALBUMIN HUMAN 5 % IV SOLN
INTRAVENOUS | Status: DC | PRN
  Administered 2015-08-28: 09:00:00 via INTRAVENOUS

## 2015-08-28 MED ORDER — METHOCARBAMOL 500 MG PO TABS
500.0000 mg | ORAL_TABLET | Freq: Four times a day (QID) | ORAL | Status: DC | PRN
  Administered 2015-08-29: 500 mg via ORAL
  Filled 2015-08-28: qty 1

## 2015-08-28 MED ORDER — MIDAZOLAM HCL 2 MG/2ML IJ SOLN
INTRAMUSCULAR | Status: AC
  Filled 2015-08-28: qty 2

## 2015-08-28 MED ORDER — POLYETHYLENE GLYCOL 3350 17 G PO PACK: 17.0000 g | PACK | Freq: Every day | ORAL | Status: DC | PRN

## 2015-08-28 MED ORDER — LEVOTHYROXINE SODIUM 75 MCG PO TABS
150.0000 ug | ORAL_TABLET | Freq: Every day | ORAL | Status: DC
  Administered 2015-08-29: 150 ug via ORAL
  Filled 2015-08-28: qty 2

## 2015-08-28 SURGICAL SUPPLY — 51 items
BAG DECANTER FOR FLEXI CONT (MISCELLANEOUS) ×2 IMPLANT
BLADE SURG ROTATE 9660 (MISCELLANEOUS) IMPLANT
CAPT HIP TOTAL 2 ×2 IMPLANT
COVER PERINEAL POST (MISCELLANEOUS) ×3 IMPLANT
COVER SURGICAL LIGHT HANDLE (MISCELLANEOUS) ×3 IMPLANT
DECANTER SPIKE VIAL GLASS SM (MISCELLANEOUS) ×2 IMPLANT
DRAPE C-ARM 42X72 X-RAY (DRAPES) ×3 IMPLANT
DRAPE STERI IOBAN 125X83 (DRAPES) ×3 IMPLANT
DRAPE U-SHAPE 47X51 STRL (DRAPES) ×6 IMPLANT
DRSG AQUACEL AG ADV 3.5X10 (GAUZE/BANDAGES/DRESSINGS) ×3 IMPLANT
DURAPREP 26ML APPLICATOR (WOUND CARE) ×3 IMPLANT
ELECT BLADE 4.0 EZ CLEAN MEGAD (MISCELLANEOUS) ×6
ELECT REM PT RETURN 9FT ADLT (ELECTROSURGICAL) ×3
ELECTRODE BLDE 4.0 EZ CLN MEGD (MISCELLANEOUS) ×1 IMPLANT
ELECTRODE REM PT RTRN 9FT ADLT (ELECTROSURGICAL) ×1 IMPLANT
FACESHIELD WRAPAROUND (MASK) ×9 IMPLANT
FACESHIELD WRAPAROUND OR TEAM (MASK) ×2 IMPLANT
GLOVE BIO SURGEON STRL SZ7.5 (GLOVE) ×3 IMPLANT
GLOVE BIO SURGEON STRL SZ8.5 (GLOVE) ×6 IMPLANT
GLOVE BIOGEL PI IND STRL 8 (GLOVE) ×2 IMPLANT
GLOVE BIOGEL PI IND STRL 9 (GLOVE) ×1 IMPLANT
GLOVE BIOGEL PI INDICATOR 8 (GLOVE) ×4
GLOVE BIOGEL PI INDICATOR 9 (GLOVE) ×2
GOWN STRL REUS W/ TWL LRG LVL3 (GOWN DISPOSABLE) ×1 IMPLANT
GOWN STRL REUS W/ TWL XL LVL3 (GOWN DISPOSABLE) ×2 IMPLANT
GOWN STRL REUS W/TWL LRG LVL3 (GOWN DISPOSABLE) ×3
GOWN STRL REUS W/TWL XL LVL3 (GOWN DISPOSABLE) ×6
KIT BASIN OR (CUSTOM PROCEDURE TRAY) ×3 IMPLANT
KIT ROOM TURNOVER OR (KITS) ×3 IMPLANT
MANIFOLD NEPTUNE II (INSTRUMENTS) ×3 IMPLANT
NEEDLE 22X1 1/2 (OR ONLY) (NEEDLE) ×4 IMPLANT
NS IRRIG 1000ML POUR BTL (IV SOLUTION) ×3 IMPLANT
PACK TOTAL JOINT (CUSTOM PROCEDURE TRAY) ×3 IMPLANT
PAD ARMBOARD 7.5X6 YLW CONV (MISCELLANEOUS) ×6 IMPLANT
SAW OSC TIP CART 19.5X105X1.3 (SAW) ×3 IMPLANT
SPONGE LAP 18X18 X RAY DECT (DISPOSABLE) ×2 IMPLANT
SUT ETHIBOND NAB CT1 #1 30IN (SUTURE) ×6 IMPLANT
SUT VIC AB 0 CTX 36 (SUTURE) ×3
SUT VIC AB 0 CTX36XBRD ANTBCTR (SUTURE) ×1 IMPLANT
SUT VIC AB 1 CTX 36 (SUTURE) ×3
SUT VIC AB 1 CTX36XBRD ANBCTR (SUTURE) ×1 IMPLANT
SUT VIC AB 2-0 CT1 27 (SUTURE) ×6
SUT VIC AB 2-0 CT1 TAPERPNT 27 (SUTURE) ×2 IMPLANT
SUT VIC AB 3-0 PS2 18 (SUTURE) ×3
SUT VIC AB 3-0 PS2 18XBRD (SUTURE) ×1 IMPLANT
SYR CONTROL 10ML LL (SYRINGE) ×4 IMPLANT
TOWEL OR 17X24 6PK STRL BLUE (TOWEL DISPOSABLE) ×3 IMPLANT
TOWEL OR 17X26 10 PK STRL BLUE (TOWEL DISPOSABLE) ×3 IMPLANT
TRAY FOLEY CATH 14FR (SET/KITS/TRAYS/PACK) IMPLANT
WATER STERILE IRR 1000ML POUR (IV SOLUTION) ×1 IMPLANT
YANKAUER SUCT BULB TIP NO VENT (SUCTIONS) ×2 IMPLANT

## 2015-08-28 NOTE — Interval H&P Note (Signed)
History and Physical Interval Note:  08/28/2015 7:07 AM  Pamela Holloway  has presented today for surgery, with the diagnosis of RIGHT HIP OSTEOARTHRITIS  The various methods of treatment have been discussed with the patient and family. After consideration of risks, benefits and other options for treatment, the patient has consented to  Procedure(s): TOTAL HIP ARTHROPLASTY ANTERIOR APPROACH (Right) as a surgical intervention .  The patient's history has been reviewed, patient examined, no change in status, stable for surgery.  I have reviewed the patient's chart and labs.  Questions were answered to the patient's satisfaction.     Kerin Salen

## 2015-08-28 NOTE — Anesthesia Procedure Notes (Signed)
Procedure Name: Intubation Date/Time: 08/28/2015 7:32 AM Performed by: Adalberto Ill Pre-anesthesia Checklist: Patient identified, Emergency Drugs available, Suction available, Patient being monitored and Timeout performed Patient Re-evaluated:Patient Re-evaluated prior to inductionOxygen Delivery Method: Circle system utilized Preoxygenation: Pre-oxygenation with 100% oxygen Intubation Type: IV induction Ventilation: Mask ventilation without difficulty Laryngoscope Size: Miller and 2 Grade View: Grade I Tube type: Oral Number of attempts: 1 Placement Confirmation: ETT inserted through vocal cords under direct vision,  positive ETCO2 and breath sounds checked- equal and bilateral Secured at: 22 cm Tube secured with: Tape Dental Injury: Teeth and Oropharynx as per pre-operative assessment

## 2015-08-28 NOTE — Transfer of Care (Signed)
Immediate Anesthesia Transfer of Care Note  Patient: Pamela Holloway  Procedure(s) Performed: Procedure(s): TOTAL HIP ARTHROPLASTY ANTERIOR APPROACH (Right)  Patient Location: PACU  Anesthesia Type:General  Level of Consciousness: awake, alert , oriented and patient cooperative  Airway & Oxygen Therapy: Patient Spontanous Breathing and Patient connected to face mask oxygen  Post-op Assessment: Report given to RN and Post -op Vital signs reviewed and stable  Post vital signs: Reviewed and stable  Last Vitals:  Vitals:   08/28/15 0704  BP: (!) 199/78  Pulse: 72  Resp: 20  Temp: 36.8 C    Last Pain:  Vitals:   08/28/15 0704  TempSrc: Oral  PainSc:          Complications: No apparent anesthesia complications

## 2015-08-28 NOTE — Evaluation (Signed)
Physical Therapy Evaluation Patient Details Name: Pamela Holloway MRN: [id number] DOB: 04-Dec-1960 Today's Date: 08/28/2015   History of Present Illness  Melodie Bouillon admitted s/p R THA. PMH includes hypothyroidism, HTN, HLD, hepatitis, and DM  Clinical Impression  Pt presents with decreased strength, mobility, ROM, balance s/p R THA. Pt tolerated ambulation with RW and min guard with LOB x2 due to R knee buckling requiring min A to maintain balance. Pt lives at home with her husband who will be available for assistance for a week once home. PT recommending home health PT to address deficits to return to mod I for safety at home. Continue acute follow.     Follow Up Recommendations Home health PT;Supervision for mobility/OOB    Equipment Recommendations  Rolling walker with 5" wheels    Recommendations for Other Services       Precautions / Restrictions Precautions Precautions: Fall Precaution Comments: No extreme movements of R hip. Restrictions Weight Bearing Restrictions: Yes RLE Weight Bearing: Weight bearing as tolerated      Mobility  Bed Mobility Overal bed mobility: Needs Assistance Bed Mobility: Supine to Sit     Supine to sit: Supervision;HOB elevated     General bed mobility comments: Pt demonstrated difficulty moving R LE at first, but was able to problem solve without cueing.   Transfers Overall transfer level: Needs assistance Equipment used: Rolling walker (2 wheeled) Transfers: Sit to/from Stand Sit to Stand: Min guard         General transfer comment: Cues for hand placement on bed then reach to RW.  Ambulation/Gait Ambulation/Gait assistance: Min guard;Min assist (Min assist for LOB x2 due to knee R knee buckling) Ambulation Distance (Feet): 100 Feet Assistive device: Rolling walker (2 wheeled) Gait Pattern/deviations: Step-to pattern;Decreased stride length   Gait velocity interpretation: Below normal speed for age/gender General Gait  Details: Pt able to put significant weight through R LE with bil supported UE. Pt reported R knee pain and occassional knee buckling.  Stairs            Wheelchair Mobility    Modified Rankin (Stroke Patients Only)       Balance Overall balance assessment: Needs assistance Sitting-balance support: No upper extremity supported;Feet supported Sitting balance-Leahy Scale: Good     Standing balance support: Bilateral upper extremity supported Standing balance-Leahy Scale: Poor Standing balance comment: Pt able to weight shift to R LE with UE support.                             Pertinent Vitals/Pain Pain Assessment: Faces Faces Pain Scale: Hurts a little bit Pain Location: R knee; denied pain in R hip Pain Descriptors / Indicators: Discomfort;Aching Pain Intervention(s): Premedicated before session    Home Living Family/patient expects to be discharged to:: Private residence Living Arrangements: Spouse/significant other Available Help at Discharge: Family Type of Home: House Home Access: Stairs to enter Entrance Stairs-Rails: Psychiatric nurse of Steps: 5 Home Layout: One level Home Equipment: None      Prior Function Level of Independence: Independent               Hand Dominance   Dominant Hand: Right    Extremity/Trunk Assessment   Upper Extremity Assessment: Overall WFL for tasks assessed           Lower Extremity Assessment: RLE deficits/detail RLE Deficits / Details: s/p R THA; pt able to perform long arc quad  Communication   Communication: No difficulties  Cognition Arousal/Alertness: Awake/alert Behavior During Therapy: WFL for tasks assessed/performed Overall Cognitive Status: Within Functional Limits for tasks assessed                      General Comments General comments (skin integrity, edema, etc.): Pt reported she felt good while ambulating.     Exercises Total Joint  Exercises Ankle Circles/Pumps: AROM;Both;10 reps;Seated Gluteal Sets: AROM;Both;10 reps;Seated Long Arc Quad: AROM;Both;10 reps;Seated (Hold for 10 sec)      Assessment/Plan    PT Assessment Patient needs continued PT services  PT Diagnosis Difficulty walking;Abnormality of gait;Generalized weakness;Acute pain   PT Problem List Decreased activity tolerance;Decreased strength;Decreased range of motion;Decreased balance;Decreased mobility;Decreased coordination;Decreased knowledge of use of DME;Decreased knowledge of precautions;Pain  PT Treatment Interventions DME instruction;Gait training;Stair training;Functional mobility training;Therapeutic activities;Therapeutic exercise;Balance training;Neuromuscular re-education;Patient/family education   PT Goals (Current goals can be found in the Care Plan section) Acute Rehab PT Goals Patient Stated Goal: none stated. PT Goal Formulation: With patient/family Time For Goal Achievement: 09/04/15 Potential to Achieve Goals: Good    Frequency 7X/week   Barriers to discharge        Co-evaluation               End of Session Equipment Utilized During Treatment: Gait belt Activity Tolerance: Patient tolerated treatment well Patient left: in chair;with call bell/phone within reach;with family/visitor present           Time: [id number] PT Time Calculation (min) (ACUTE ONLY): 27 min   Charges:   PT Evaluation $PT Eval Moderate Complexity: 1 Procedure PT Treatments $Gait Training: 8-22 mins   PT G Codes:        Mariangela Heldt 09/16/2015, 3:24 PM  Tawni Millers, SPT (student physical therapist) Acute Rehabilitation Services [phone]

## 2015-08-28 NOTE — Anesthesia Preprocedure Evaluation (Addendum)
Anesthesia Evaluation  Patient identified by MRN, date of birth, ID band Patient awake    Reviewed: Allergy & Precautions, NPO status , Patient's Chart, lab work & pertinent test results  Airway Mallampati: II  TM Distance: >3 FB Neck ROM: Full    Dental no notable dental hx.    Pulmonary neg pulmonary ROS, former smoker,    Pulmonary exam normal breath sounds clear to auscultation       Cardiovascular hypertension, Normal cardiovascular exam Rhythm:Regular Rate:Normal     Neuro/Psych negative neurological ROS  negative psych ROS   GI/Hepatic negative GI ROS, (+) Hepatitis -, C  Endo/Other  diabetesHypothyroidism   Renal/GU negative Renal ROS  negative genitourinary   Musculoskeletal negative musculoskeletal ROS (+)   Abdominal   Peds negative pediatric ROS (+)  Hematology negative hematology ROS (+)   Anesthesia Other Findings   Reproductive/Obstetrics negative OB ROS                             Anesthesia Physical Anesthesia Plan  ASA: III  Anesthesia Plan: General   Post-op Pain Management:    Induction: Intravenous  Airway Management Planned: Oral ETT  Additional Equipment:   Intra-op Plan:   Post-operative Plan:   Informed Consent: I have reviewed the patients History and Physical, chart, labs and discussed the procedure including the risks, benefits and alternatives for the proposed anesthesia with the patient or authorized representative who has indicated his/her understanding and acceptance.   Dental advisory given  Plan Discussed with: CRNA and Surgeon  Anesthesia Plan Comments: (Pt prefers GA)       Anesthesia Quick Evaluation

## 2015-08-28 NOTE — Op Note (Signed)
OPERATIVE REPORT    DATE OF PROCEDURE:  08/28/2015       PREOPERATIVE DIAGNOSIS:  RIGHT HIP OSTEOARTHRITIS                                                          POSTOPERATIVE DIAGNOSIS:  RIGHT HIP OSTEOARTHRITIS                                                           PROCEDURE: Anterior R total hip arthroplasty using a 48 mm DePuy Pinnacle  Cup, [employer], 0-degree polyethylene liner, a +1.5 32 mm ceramic head, a 3 Depuy Triloc stem   SURGEON: Macklin Jacquin Holloway    ASSISTANT:   [employer]  (present throughout entire procedure and necessary for timely completion of the procedure)   ANESTHESIA: GET BLOOD LOSS: 300 FLUID REPLACEMENT: 1500 crystalloid Antibiotic: 2gm ancef Tranexamic Acid: 1gm iv 2gm topical COMPLICATIONS: none    INDICATIONS FOR PROCEDURE: A 55 y.o. year-old With  RIGHT HIP OSTEOARTHRITIS   for 3 years, x-rays show bone-on-bone arthritic changes, and osteophytes. Despite conservative measures with observation, anti-inflammatory medicine, narcotics, use of a cane, has severe unremitting pain and can ambulate only a few blocks before resting. Patient desires elective R total hip arthroplasty to decrease pain and increase function. The risks, benefits, and alternatives were discussed at length including but not limited to the risks of infection, bleeding, nerve injury, stiffness, blood clots, the need for revision surgery, cardiopulmonary complications, among others, and they were willing to proceed. Questions answered     PROCEDURE IN DETAIL: The patient was identified by armband,  received preoperative IV antibiotics in the holding area at [facility], taken to the operating room , appropriate anesthetic monitors  were attached and  anesthesia was induced with the patienton the gurney. The HANA boots were applied to the feet and he was then transferred to the HANA table with a peroneal post and support underneath the non-operative le, which  was locked in 5 lb traction. Theoperative lower extremity was then prepped and draped in the usual sterile fashion from just above the iliac crest to the knee. And a timeout procedure was performed. We then made a 10 cm incision along the interval at the leading edge of the tensor fascia lata of starting at 2 cm lateral to and 2 cm distal to the ASIS. Small bleeders in the skin and subcutaneous tissue identified and cauterized we dissected down to the fascia and made an incision in the fascia allowing [place] to elevate the fascia of the tensor muscle and exploited the interval between the rectus and the tensor fascia lata. A Hohmann retractor was then placed along the superior neck of the femur and a Cobra retractor along the inferior neck of the femur we teed the capsule starting out at the superior anterior aspect of the acetabulum going distally and made the T along the neck both leaflets of the T were tagged with #2 Ethibond suture. Cobra retractors were then placed along the inferior and superior neck allowing [place] to perform a standard neck cut and removed  the femoral head with a power corkscrew. We then placed a right angle Hohmann retractor along the anterior aspect of the acetabulum a spiked Cobra in the cotyloid notch and posteriorly a Muelller retractor. We then sequentially reamed up to a 47 mm basket reamer obtaining good coverage in all quadrants, verified by C-arm imaging. Under C-arm control with and hammered into place a 54 mm Pinnacle cup in 45 of abduction and 15 of anteversion. The cup seated nicely and required no supplemental screws. We then placed a central hole Eliminator and a 0 polyethylene liner. The foot was then externally rotated to 100, the HANA elevator was placed around the flare of the greater trochanter and the limb was extended and abducted delivering the proximal femur up into the wound. A medium Hohmann retractor was placed over the greater trochanter and a Mueller retractor along  the posterior femoral neck completing the exposure. We then performed releases superiorly and and inferiorly of the capsule going back to the pirformis fossa superiorly and to the lesser trochanter inferiorly. We then entered the proximal femur with the box cutting offset chisel followed by, a canal sounder, the chili pepper and broaching up to a 3 broach. This seated nicely and we reamed the calcar. A trial reduction was performed with a 1.5 mm 32 mm head.The limb lengths were excellent the hip was stable in 90 of external rotation. At this point the trial components removed and we hammered into place a # 3 Tri-Lock stem with [employer] coating. This was a hi offset stem and a + 1.5 32 mm ceramic ball was then hammered into place the hip was reduced and final C-arm images obtained. The wound was thoroughly irrigated with normal saline solution. We repaired the ant capsule and the tensor fascia lot a with running 0 vicryl suture. the subcutaneous tissue was closed with 2-0 and 3-0 Vicryl suture followed by an Aquacil dressing. At this point the patient was awaken and transferred to hospital gurney without difficulty. The subcutaneous tissue with 0 and 2-0 undyed Vicryl suture and the skin with running  3-0 vicryl subcuticular suture. Aquacil dressing was applied. The patient was then unclamped, rolled supine, awaken extubated and taken to recovery room without difficulty in stable condition.   Pamela Holloway 08/28/2015, 8:58 AM

## 2015-08-28 NOTE — Discharge Instructions (Addendum)

## 2015-08-29 ENCOUNTER — Encounter (HOSPITAL_COMMUNITY): Payer: Self-pay | Admitting: Orthopedic Surgery

## 2015-08-29 LAB — GLUCOSE, CAPILLARY
GLUCOSE-CAPILLARY: 228 mg/dL — AB (ref 65–99)
GLUCOSE-CAPILLARY: 240 mg/dL — AB (ref 65–99)
Glucose-Capillary: 252 mg/dL — ABNORMAL HIGH (ref 65–99)

## 2015-08-29 LAB — BASIC METABOLIC PANEL
Anion gap: 12 (ref 5–15)
BUN: 11 mg/dL (ref 6–20)
CHLORIDE: 97 mmol/L — AB (ref 101–111)
CO2: 25 mmol/L (ref 22–32)
CREATININE: 0.73 mg/dL (ref 0.44–1.00)
Calcium: 9 mg/dL (ref 8.9–10.3)
GFR calc Af Amer: >60 mL/min (ref >60)
GLUCOSE: 219 mg/dL — AB (ref 65–99)
POTASSIUM: 3.4 mmol/L — AB (ref 3.5–5.1)
Sodium: 134 mmol/L — ABNORMAL LOW (ref 135–145)

## 2015-08-29 LAB — CBC
HCT: 38.6 % (ref 36.0–46.0)
Hemoglobin: 12.8 g/dL (ref 12.0–15.0)
MCH: 29 pg (ref 26.0–34.0)
MCHC: 33.2 g/dL (ref 30.0–36.0)
MCV: 87.5 fL (ref 78.0–100.0)
PLATELETS: 200 10*3/uL (ref 150–400)
RBC: 4.41 MIL/uL (ref 3.87–5.11)
RDW: 14.4 % (ref 11.5–15.5)
WBC: 16.2 10*3/uL — ABNORMAL HIGH (ref 4.0–10.5)

## 2015-08-29 LAB — HEMOGLOBIN A1C
HEMOGLOBIN A1C: 7.4 % — AB (ref 4.8–5.6)
Mean Plasma Glucose: 166 mg/dL

## 2015-08-29 NOTE — Discharge Summary (Signed)
Patient ID: Pamela Holloway MRN: [id number] DOB/AGE: [birth year] 55 y.o.  Admit date: 08/28/2015 Discharge date: 08/29/2015  Admission Diagnoses:  Principal Problem:   Osteoarthritis of right hip Active Problems:   Primary localized osteoarthritis of right hip   Discharge Diagnoses:  Same  Past Medical History:  Diagnosis Date  . Diabetes mellitus without complication ([place])   . Family history of adverse reaction to anesthesia    son had n/v  . Hepatitis    h/o hep C, 2012 no signs  . Hyperlipidemia   . Hypertension   . Hypothyroidism   . Thyroid disease    Hypothyroid    Surgeries: Procedure(s): TOTAL HIP ARTHROPLASTY ANTERIOR APPROACH on 08/28/2015   Consultants:   Discharged Condition: Improved  Hospital Course: Pamela Holloway is an 55 y.o. female who was admitted 08/28/2015 for operative treatment ofOsteoarthritis of right hip. Patient has severe unremitting pain that affects sleep, daily activities, and work/hobbies. After pre-op clearance the patient was taken to the operating room on 08/28/2015 and underwent  Procedure(s): TOTAL HIP ARTHROPLASTY ANTERIOR APPROACH.    Patient was given perioperative antibiotics: Anti-infectives    Start     Dose/Rate Route Frequency Ordered Stop   08/28/15 0600  ceFAZolin (ANCEF) IVPB 2g/100 mL premix     2 g 200 mL/hr over 30 Minutes Intravenous On call to O.R. 08/27/15 1338 08/28/15 0758       Patient was given sequential compression devices, early ambulation, and chemoprophylaxis to prevent DVT.  Patient benefited maximally from hospital stay and there were no complications.    Recent vital signs: Patient Vitals for the past 24 hrs:  BP Temp Temp src Pulse Resp SpO2  08/29/15 0540 136/70 98.6 F (37 C) Oral 87 16 96 %  08/29/15 0100 121/67 98.5 F (36.9 C) Oral 90 15 93 %  08/28/15 2107 137/74 98.3 F (36.8 C) Oral (!) 103 14 94 %     Recent laboratory studies:  Recent Labs  08/29/15 0545  WBC 16.2*  HGB 12.8  HCT  38.6  PLT 200  NA 134*  K 3.4*  CL 97*  CO2 25  BUN 11  CREATININE 0.73  GLUCOSE 219*  CALCIUM 9.0     Discharge Medications:     Medication List    STOP taking these medications   meloxicam 15 MG tablet Commonly known as:  MOBIC     TAKE these medications   amitriptyline 50 MG tablet Commonly known as:  ELAVIL One half tab PO qHS for a week, then one tab PO qHS.   apixaban 2.5 MG Tabs tablet Commonly known as:  ELIQUIS Take 1 tablet (2.5 mg total) by mouth 2 (two) times daily.   cetirizine 10 MG tablet Commonly known as:  ZYRTEC Take 1 tablet (10 mg total) by mouth daily.   Dapagliflozin-Metformin HCl ER 10-998 MG Tb24 Commonly known as:  XIGDUO XR Take 1 tablet by mouth daily.   Dulaglutide 0.75 MG/0.5ML Sopn Commonly known as:  TRULICITY Inject [id number] mg into the skin once a week.   glipiZIDE 10 MG tablet Commonly known as:  GLUCOTROL TAKE 1 TABLET TWICE A DAY  MUST KEEP UP COMING        APPOINTMENT WITH PRIMARY   CARE PHYSICIAN   hydrochlorothiazide 25 MG tablet Commonly known as:  HYDRODIURIL TAKE ONE-HALF (1/2) TABLET DAILY   hydrOXYzine 25 MG tablet Commonly known as:  ATARAX/VISTARIL Take 1 tablet (25 mg total) by mouth every 6 (six) hours as  needed for itching.   levothyroxine 150 MCG tablet Commonly known as:  SYNTHROID, LEVOTHROID Take 1 tablet (150 mcg total) by mouth daily before breakfast.   levothyroxine 50 MCG tablet Commonly known as:  SYNTHROID, LEVOTHROID Take 1 tablet (50 mcg total) by mouth every 7 (seven) days.   lidocaine-prilocaine cream Commonly known as:  EMLA Apply 1 application topically as needed. Before injection   losartan-hydrochlorothiazide 100-25 MG tablet Commonly known as:  HYZAAR TAKE 1 TABLET DAILY   methocarbamol 500 MG tablet Commonly known as:  ROBAXIN Take 1 tablet (500 mg total) by mouth 2 (two) times daily with a meal.   omega-3 acid ethyl esters 1 g capsule Commonly known as:  LOVAZA Take 2  capsules (2 g total) by mouth 2 (two) times daily.   oxyCODONE-acetaminophen 5-325 MG tablet Commonly known as:  ROXICET Take 1 tablet by mouth every 4 (four) hours as needed.   pravastatin 40 MG tablet Commonly known as:  PRAVACHOL Take 1 tablet (40 mg total) by mouth daily.   vitamin E 100 UNIT capsule Take by mouth daily.       Diagnostic Studies: Dg Chest 2 View  Result Date: 08/18/2015 CLINICAL DATA:  Preoperative exam prior to right hip joint replacement ; ex-smoker, diabetes EXAM: CHEST  2 VIEW COMPARISON:  Portable chest x-ray of February 11, 2011 FINDINGS: The lungs are well-expanded and clear. The heart and pulmonary vascularity are normal. The mediastinum is normal in width. There is multilevel degenerative disc disease of the thoracic spine. There is an adjustable lap band device in place. There is aortic arch calcification IMPRESSION: There is no active cardiopulmonary disease. Aortic atherosclerosis. Electronically Signed   By: David  Martinique M.D.   On: 08/18/2015 13:35   Dg C-arm 1-60 Min  Result Date: 08/28/2015 CLINICAL DATA:  Total right hip replacement EXAM: OPERATIVE RIGHT HIP (WITH PELVIS IF PERFORMED) 2 VIEWS TECHNIQUE: Fluoroscopic spot image(s) were submitted for interpretation post-operatively. COMPARISON:  None. FINDINGS: Changes of right hip replacement. Normal AP alignment. No visible hardware or bony complicating feature. IMPRESSION: Right hip replacement.  No visible complicating feature. Electronically Signed   By: Rolm Baptise M.D.   On: 08/28/2015 09:10  Dg Hip Operative Unilat With Pelvis Right  Result Date: 08/28/2015 CLINICAL DATA:  Total right hip replacement EXAM: OPERATIVE RIGHT HIP (WITH PELVIS IF PERFORMED) 2 VIEWS TECHNIQUE: Fluoroscopic spot image(s) were submitted for interpretation post-operatively. COMPARISON:  None. FINDINGS: Changes of right hip replacement. Normal AP alignment. No visible hardware or bony complicating feature. IMPRESSION: Right  hip replacement.  No visible complicating feature. Electronically Signed   By: Rolm Baptise M.D.   On: 08/28/2015 09:10   Disposition: 01-Home or Self Care  Discharge Instructions    Call MD / Call 911    Complete by:  As directed   If you experience chest pain or shortness of breath, CALL 911 and be transported to the hospital emergency room.  If you develope a fever above 101 F, pus (white drainage) or increased drainage or redness at the wound, or calf pain, call your surgeon's office.   Change dressing    Complete by:  As directed   You may change your dressing on day 5, then change the dressing daily with sterile 4 x 4 inch gauze dressing and paper tape.  You may clean the incision with alcohol prior to redressing   Constipation Prevention    Complete by:  As directed   Drink plenty of fluids.  Prune juice may be helpful.  You may use a stool softener, such as Colace (over the counter) 100 mg twice a day.  Use MiraLax (over the counter) for constipation as needed.   Diet - low sodium heart healthy    Complete by:  As directed   Driving restrictions    Complete by:  As directed   No driving for 2 weeks   Follow the hip precautions as taught in Physical Therapy    Complete by:  As directed   Increase activity slowly as tolerated    Complete by:  As directed   Patient may shower    Complete by:  As directed   You may shower without a dressing once there is no drainage.  Do not wash over the wound.  If drainage remains, cover wound with plastic wrap and then shower.      Follow-up Information    Kerin Salen, MD Follow up in 2 week(s).   Specialty:  Orthopedic Surgery Contact information: [place] [address] [address] .   Why:  Someone from [place] will contact you concerning start date and time for therapy. Contact information: [address] [place] [address] [phone]             Signed: Hardin Negus, ERIC R 08/29/2015, 1:08 PM

## 2015-08-29 NOTE — Progress Notes (Signed)
Physical Therapy Treatment Patient Details Name: Pamela Holloway MRN: [id number] DOB: 19-Jul-1960 Today's Date: 08/29/2015    History of Present Illness Melodie Bouillon admitted s/p R THA. PMH includes hypothyroidism, HTN, HLD, hepatitis, and DM    PT Comments    Pt performed increased mobility including, HEP exercises, stair training and advancing gait distance.  Pt is ready for d/c from PT stand point and RN informed.  HEP issued for guidance to continue exercises at home.    Follow Up Recommendations  Home health PT;Supervision for mobility/OOB     Equipment Recommendations  Rolling walker with 5" wheels    Recommendations for Other Services       Precautions / Restrictions Precautions Precautions: Fall Precaution Comments: No extreme movements of R hip. Restrictions Weight Bearing Restrictions: Yes RLE Weight Bearing: Weight bearing as tolerated    Mobility  Bed Mobility Overal bed mobility: Needs Assistance Bed Mobility: Supine to Sit     Supine to sit: Modified independent (Device/Increase time)     General bed mobility comments: No cueing good technique observed.    Transfers Overall transfer level: Needs assistance Equipment used: Rolling walker (2 wheeled) Transfers: Sit to/from Stand Sit to Stand: Supervision         General transfer comment: Cues to push from seated surface vs. holding to RW.    Ambulation/Gait Ambulation/Gait assistance: Supervision Ambulation Distance (Feet): 320 Feet Assistive device: Rolling walker (2 wheeled) Gait Pattern/deviations: Step-through pattern;Trunk flexed   Gait velocity interpretation: at or above normal speed for age/gender General Gait Details: No buckling noted with step through gait pattern.  Supervision for RW safety.     Stairs Stairs: Yes Stairs assistance: Supervision Stair Management: No rails;One rail Left;Forwards;Step to pattern Number of Stairs: 6 General stair comments: Cues for sequencing  and unilateral hand placement, good technique demonstrated with cueing.    Wheelchair Mobility    Modified Rankin (Stroke Patients Only)       Balance Overall balance assessment: Needs assistance   Sitting balance-Leahy Scale: Good       Standing balance-Leahy Scale: Poor                      Cognition Arousal/Alertness: Awake/alert Behavior During Therapy: WFL for tasks assessed/performed Overall Cognitive Status: Within Functional Limits for tasks assessed                      Exercises Total Joint Exercises Ankle Circles/Pumps: AROM;Both;10 reps;Supine Quad Sets: AROM;10 reps;Supine Short Arc Quad: AROM;Right;10 reps;Supine Heel Slides: AROM;Right;10 reps;Supine Hip ABduction/ADduction: AROM;Right;20 reps;Supine;Standing (1x10 supine and 1x10 standing.  ) Long Arc Quad: AROM;Right;10 reps;Seated Knee Flexion: Right;10 reps;Standing Marching in Standing: AROM;Right;10 reps;Standing Standing Hip Extension: AROM;Right;10 reps;Standing    General Comments        Pertinent Vitals/Pain Pain Assessment: Faces Faces Pain Scale: Hurts a little bit Pain Location: R hip Pain Descriptors / Indicators: Discomfort Pain Intervention(s): Monitored during session;Repositioned    Home Living                      Prior Function            PT Goals (current goals can now be found in the care plan section) Acute Rehab PT Goals Patient Stated Goal: go home and sleep in her own bed.   PT Goal Formulation: With patient Potential to Achieve Goals: Good Progress towards PT goals: Progressing toward goals    Frequency  7X/week    PT Plan Current plan remains appropriate    Co-evaluation             End of Session Equipment Utilized During Treatment: Gait belt Activity Tolerance: Patient tolerated treatment well Patient left: in chair;with call bell/phone within reach;with family/visitor present     Time: [id number] PT Time Calculation  (min) (ACUTE ONLY): 30 min  Charges:  $Gait Training: 8-22 mins $Therapeutic Exercise: 8-22 mins                    G Codes:      Cristela Blue 09-25-15, 9:48 AM Governor Rooks, PTA pager [phone]

## 2015-08-29 NOTE — Evaluation (Signed)
Occupational Therapy Evaluation and Discharge Patient Details Name: Pamela Holloway MRN: [id number] DOB: [birth year] Today's Date: 08/29/2015    History of Present Illness Ptwas admitted s/p R THA. PMH includes hypothyroidism, HTN, HLD, hepatitis, and DM   Clinical Impression   PTA Pt independent in ADL and IADL. Pt able to perform ADL standing at sink at supervision level. Pt able to demonstrate LB dressing. Pt provided with education for LB bathing. Pt states that her husband is willing and able to provide necessary home care. OT provided education in ADL compensation, and safety. No OT follow up is recommended at this time.     Follow Up Recommendations  No OT follow up    Equipment Recommendations  3 in 1 bedside comode    Recommendations for Other Services       Precautions / Restrictions Precautions Precautions: Fall Precaution Comments: No extreme movements of R hip. Restrictions Weight Bearing Restrictions: Yes RLE Weight Bearing: Weight bearing as tolerated      Mobility Bed Mobility               General bed mobility comments: Pt sitting in chair when OT entered room  Transfers Overall transfer level: Needs assistance Equipment used: Rolling walker (2 wheeled)   Sit to Stand: Supervision         General transfer comment: Cues to push from seated surface vs. holding to RW.      Balance                                            ADL Overall ADL's : Needs assistance/impaired Eating/Feeding: Set up;Sitting   Grooming: Wash/dry hands;Wash/dry face;Oral care;Set up;Standing Grooming Details (indicate cue type and reason): Pt able to perform grooming standing at sink.     Lower Body Bathing: Supervison/ safety;Sit to/from stand (OT suggested extended sponge to help reaching LB)   Upper Body Dressing : Supervision/safety;Sitting   Lower Body Dressing: Set up;With caregiver independent assisting;Sit to/from stand Lower Body  Dressing Details (indicate cue type and reason): Pt able to don underwear with set up. Toilet Transfer: Supervision/safety;Ambulation;BSC   Toileting- Water quality scientist and Hygiene: Supervision/safety   Tub/ Shower Transfer: Nature conservation officer;Shower seat (OT educated safe shower entry)   Functional mobility during ADLs: Supervision/safety;Rolling walker       Vision     Perception     Praxis      Pertinent Vitals/Pain Pain Assessment: 0-10 Pain Score: 3  Pain Location: R hip Pain Descriptors / Indicators: Grimacing;Guarding;Sore Pain Intervention(s): Monitored during session;Repositioned     Hand Dominance Right   Extremity/Trunk Assessment Upper Extremity Assessment Upper Extremity Assessment: Overall WFL for tasks assessed   Lower Extremity Assessment Lower Extremity Assessment: RLE deficits/detail RLE Deficits / Details: decreased ROM and strength post surgery       Communication Communication Communication: No difficulties   Cognition Arousal/Alertness: Awake/alert Behavior During Therapy: WFL for tasks assessed/performed Overall Cognitive Status: Within Functional Limits for tasks assessed                     General Comments       Exercises       Shoulder Instructions      Home Living Family/patient expects to be discharged to:: Private residence Living Arrangements: Spouse/significant other Available Help at Discharge: Family Type of Home: House Home Access: Stairs to  enter Entrance Stairs-Number of Steps: 5 Entrance Stairs-Rails: Right;Left Home Layout: One level     Bathroom Shower/Tub: Occupational psychologist: Handicapped height Bathroom Accessibility: Yes How Accessible: Accessible via walker Home Equipment: Mason - single point   Additional Comments: husband is willing and able to assist with ADL.      Prior Functioning/Environment Level of Independence: Independent             OT Diagnosis:  Acute pain   OT Problem List: Pain   OT Treatment/Interventions:      OT Goals(Current goals can be found in the care plan section) Acute Rehab OT Goals Patient Stated Goal: go home and sleep in her own bed.   OT Goal Formulation: With patient/family Time For Goal Achievement: 09/05/15 Potential to Achieve Goals: Good  OT Frequency:     Barriers to D/C:            Co-evaluation              End of Session Equipment Utilized During Treatment: Rolling walker Nurse Communication: Mobility status  Activity Tolerance: Patient tolerated treatment well Patient left: in chair;with call bell/phone within reach;with family/visitor present   Time: [id number] OT Time Calculation (min): 36 min Charges:  OT General Charges $OT Visit: 1 Procedure OT Evaluation $OT Eval Low Complexity: 1 Procedure OT Treatments $Self Care/Home Management : 8-22 mins G-Codes:    Merri Ray Kazimir Hartnett OTR/L 08/29/2015, 3:24 PM [phone]

## 2015-08-29 NOTE — Progress Notes (Signed)
PATIENT ID: Pamela Holloway  MRN: [id number]  DOB/AGE:  06/04/1960 / 55 y.o.  1 Day Post-Op Procedure(s) (LRB): TOTAL HIP ARTHROPLASTY ANTERIOR APPROACH (Right)    PROGRESS NOTE Subjective: Patient is alert, oriented, no Nausea, no Vomiting, yes passing gas, . Taking PO well. Denies SOB, Chest or Calf Pain. Using Incentive Spirometer, PAS in place. Ambulate 112ft Patient reports pain as  3/10  .    Objective: Vital signs in last 24 hours: Vitals:   08/28/15 1030 08/28/15 2107 08/29/15 0100 08/29/15 0540  BP: (!) 146/69 137/74 121/67 136/70  Pulse: 88 (!) 103 90 87  Resp: 14 14 15 16   Temp: 98.2 F (36.8 C) 98.3 F (36.8 C) 98.5 F (36.9 C) 98.6 F (37 C)  TempSrc: Oral Oral Oral Oral  SpO2: 96% 94% 93% 96%  Weight:      Height:          Intake/Output from previous day: I/O last 3 completed shifts: In: [id number] [P.O.:1080; I.V.:1120; IV Piggyback:250] Out: 750 [Blood:750]   Intake/Output this shift: No intake/output data recorded.   LABORATORY DATA:  Recent Labs  08/28/15 1715 08/29/15 0135 08/29/15 0545 08/29/15 0704  WBC  --   --  16.2*  --   HGB  --   --  12.8  --   HCT  --   --  38.6  --   PLT  --   --  200  --   GLUCAP 298* 252*  --  228*    Examination: Neurologically intact ABD soft Neurovascular intact Sensation intact distally Intact pulses distally Dorsiflexion/Plantar flexion intact Incision: dressing C/D/I No cellulitis present Compartment soft} XR AP&Lat of hip shows well placed\fixed THA  Assessment:   1 Day Post-Op Procedure(s) (LRB): TOTAL HIP ARTHROPLASTY ANTERIOR APPROACH (Right) ADDITIONAL DIAGNOSIS:  Expected Acute Blood Loss Anemia, Diabetes, HTN  Plan: PT/OT WBAT, THA  DVT Prophylaxis: SCDx72 hrs, ASA 325 mg BID x 2 weeks  DISCHARGE PLAN: Home, when passes PT  DISCHARGE NEEDS: HHPT, Walker and 3-in-1 comode seat

## 2015-08-29 NOTE — Progress Notes (Signed)
Romie Levee Hinshaw to be D/C'd Home per MD order.  Discussed with the patient and all questions fully answered.  VSS, Skin clean, dry and intact without evidence of skin break down, no evidence of skin tears noted. IV catheter discontinued intact. Site without signs and symptoms of complications. Dressing and pressure applied.  An After Visit Summary was printed and given to the patient. Patient received prescription.  D/c education completed with patient/family including follow up instructions, medication list, d/c activities limitations if indicated, with other d/c instructions as indicated by MD - patient able to verbalize understanding, all questions fully answered.   Patient instructed to return to ED, call 911, or call MD for any changes in condition.   Patient will be escorted via WC, and D/C home via private auto.  Jerry Caras 08/29/2015 2:02 PM

## 2015-08-29 NOTE — Anesthesia Postprocedure Evaluation (Signed)
Anesthesia Post Note  Patient: Aaria Respess [place]  Procedure(s) Performed: Procedure(s) (LRB): TOTAL HIP ARTHROPLASTY ANTERIOR APPROACH (Right)  Patient location during evaluation: PACU Anesthesia Type: General Level of consciousness: awake and alert Pain management: pain level controlled Vital Signs Assessment: post-procedure vital signs reviewed and stable Respiratory status: spontaneous breathing, nonlabored ventilation, respiratory function stable and patient connected to nasal cannula oxygen Cardiovascular status: blood pressure returned to baseline and stable Postop Assessment: no signs of nausea or vomiting Anesthetic complications: no    Last Vitals:  Vitals:   08/29/15 0100 08/29/15 0540  BP: 121/67 136/70  Pulse: 90 87  Resp: 15 16  Temp: 36.9 C 37 C    Last Pain:  Vitals:   08/29/15 0651  TempSrc:   PainSc: 4                  Quinette Hentges S

## 2015-08-29 NOTE — Care Management Note (Signed)
Case Management Note  Patient Details  Name: Pamela Holloway MRN: [id number] Date of Birth: 1960/10/31  Subjective/Objective:  55 yr old female s/p right total hip arthroplasty, anterior approach.                  Action/Plan: case manager spoke with patient and her husband concerning home health and DME needs. Patient was preoperatively setup with [facility], no changes. Rolling walker and 3in1 have been delivered to patient's room. She will have family support at discharge.  Expected Discharge Date:    08/29/15              Expected Discharge Plan:  [place]  In-House Referral:     Discharge planning Services  CM Consult  Post Acute Care Choice:  Durable Medical Equipment, Home Health Choice offered to:  Patient  DME Arranged:  3-N-1, Walker rolling DME Agency:  [employer]  HH Arranged:  PT Homeland:  [facility]  Status of Service:     If discussed at [place] of Stay Meetings, dates discussed:    Additional Comments:  Ninfa Meeker, RN 08/29/2015, 12:26 PM

## 2015-09-19 ENCOUNTER — Other Ambulatory Visit: Payer: Self-pay | Admitting: Family Medicine

## 2015-09-25 ENCOUNTER — Encounter: Payer: Self-pay | Admitting: Family Medicine

## 2015-09-25 ENCOUNTER — Ambulatory Visit (INDEPENDENT_AMBULATORY_CARE_PROVIDER_SITE_OTHER): Payer: BC Managed Care – PPO | Admitting: Family Medicine

## 2015-09-25 VITALS — BP 134/64 | HR 75 | Resp 16 | Ht 64.0 in | Wt 189.0 lb

## 2015-09-25 DIAGNOSIS — R300 Dysuria: Secondary | ICD-10-CM

## 2015-09-25 DIAGNOSIS — E1165 Type 2 diabetes mellitus with hyperglycemia: Secondary | ICD-10-CM | POA: Diagnosis not present

## 2015-09-25 DIAGNOSIS — IMO0001 Reserved for inherently not codable concepts without codable children: Secondary | ICD-10-CM

## 2015-09-25 DIAGNOSIS — N760 Acute vaginitis: Secondary | ICD-10-CM

## 2015-09-25 DIAGNOSIS — I10 Essential (primary) hypertension: Secondary | ICD-10-CM | POA: Diagnosis not present

## 2015-09-25 LAB — POCT URINALYSIS DIPSTICK
Bilirubin, UA: NEGATIVE
Glucose, UA: 500
Ketones, UA: NEGATIVE
Leukocytes, UA: NEGATIVE
NITRITE UA: NEGATIVE
PH UA: 5.5
PROTEIN UA: NEGATIVE
RBC UA: NEGATIVE
Spec Grav, UA: 1.01
UROBILINOGEN UA: 0.2

## 2015-09-25 LAB — WET PREP, GENITAL
Clue Cells Wet Prep HPF POC: NONE SEEN
TRICH WET PREP: NONE SEEN
WBC, Wet Prep HPF POC: NONE SEEN
YEAST WET PREP: NONE SEEN

## 2015-09-25 NOTE — Progress Notes (Signed)
Subjective:    CC: DM, HTN  HPI: Diabetes - no hypoglycemic events. No wounds or sores that are not healing well. No increased thirst or urination. Checking glucose at home. Taking medications as prescribed without any side effects.  She has lost 5 lbs.    Hypertension- Pt denies chest pain, SOB, dizziness, or heart palpitations.  Taking meds as directed w/o problems.  Denies medication side effects.    Just underwent right total hip replacement on July 31. They did a urine on her and found out she had a UTI. She is worried may have come from the [place] and wants to have her urine checked.  Using OTC Cystex and says that has helped.    Past medical history, Surgical history, Family history not pertinant except as noted below, Social history, Allergies, and medications have been entered into the medical record, reviewed, and corrections made.   Review of Systems: No fevers, chills, night sweats, weight loss, chest pain, or shortness of breath.   Objective:    General: Well Developed, well nourished, and in no acute distress.  Neuro: Alert and oriented x3, extra-ocular muscles intact, sensation grossly intact.  HEENT: Normocephalic, atraumatic  Skin: Warm and dry, no rashes. Cardiac: Regular rate and rhythm, no murmurs rubs or gallops, no lower extremity edema.  Respiratory: Clear to auscultation bilaterally. Not using accessory muscles, speaking in full sentences.   Impression and Recommendations:    DM- last a1C was 7.3 around the time of surgery. If we stop the Xigduo then we may be able to just switch to metformin and increase her Trulicity.  Has lost 5 lbs.     HTN - Well controlled. Continue current regimen. Follow up in    Dysuria  - UA neg. Will send for culture.   Vaginitis - wet prep sent.

## 2015-09-28 LAB — URINE CULTURE

## 2015-09-28 MED ORDER — SULFAMETHOXAZOLE-TRIMETHOPRIM 800-160 MG PO TABS: 1.0000 | ORAL_TABLET | Freq: Two times a day (BID) | ORAL | 0 refills | Status: DC

## 2015-09-28 NOTE — Addendum Note (Signed)
Addended by: Beatrice Lecher D on: 09/28/2015 05:16 PM   Modules accepted: Orders

## 2015-09-29 MED ORDER — DULAGLUTIDE 1.5 MG/0.5ML ~~LOC~~ SOAJ: 1.5000 mg | SUBCUTANEOUS | 5 refills | Status: DC

## 2015-09-29 MED ORDER — METFORMIN HCL 1000 MG PO TABS
1000.0000 mg | ORAL_TABLET | Freq: Two times a day (BID) | ORAL | 3 refills | Status: DC
Start: 2015-09-29 — End: 2016-09-02

## 2015-09-29 NOTE — Addendum Note (Signed)
Addended by: Beatrice Lecher D on: 09/29/2015 10:40 AM   Modules accepted: Orders

## 2015-10-04 ENCOUNTER — Other Ambulatory Visit: Payer: Self-pay

## 2015-10-04 MED ORDER — SULFAMETHOXAZOLE-TRIMETHOPRIM 800-160 MG PO TABS: 1.0000 | ORAL_TABLET | Freq: Two times a day (BID) | ORAL | 0 refills | Status: DC

## 2015-11-01 ENCOUNTER — Other Ambulatory Visit: Payer: Self-pay | Admitting: Family Medicine

## 2015-11-02 LAB — HM COLONOSCOPY

## 2015-11-03 ENCOUNTER — Encounter: Payer: Self-pay | Admitting: Family Medicine

## 2015-11-10 ENCOUNTER — Other Ambulatory Visit: Payer: Self-pay | Admitting: Family Medicine

## 2015-12-06 ENCOUNTER — Other Ambulatory Visit: Payer: Self-pay | Admitting: *Deleted

## 2015-12-06 ENCOUNTER — Telehealth: Payer: Self-pay | Admitting: *Deleted

## 2015-12-06 MED ORDER — LEVOTHYROXINE SODIUM 150 MCG PO TABS: 150.0000 ug | ORAL_TABLET | Freq: Every day | ORAL | 1 refills | Status: DC

## 2015-12-06 NOTE — Telephone Encounter (Signed)
Submitted PA through covermymeds Key: [id number]

## 2015-12-19 ENCOUNTER — Encounter: Payer: Self-pay | Admitting: *Deleted

## 2015-12-19 ENCOUNTER — Other Ambulatory Visit: Payer: Self-pay | Admitting: *Deleted

## 2015-12-19 MED ORDER — DULAGLUTIDE 1.5 MG/0.5ML ~~LOC~~ SOAJ: 1.5000 mg | SUBCUTANEOUS | 5 refills | Status: DC

## 2015-12-20 NOTE — Telephone Encounter (Signed)
Omega-3-Acid was denied stating patient's triglycerides were not 500 or above. Upon further review of the patient's chart it is noted that at one point her triglycerides were 999. Faxed appeal letter along with lab results

## 2015-12-26 ENCOUNTER — Telehealth: Payer: Self-pay | Admitting: *Deleted

## 2015-12-26 NOTE — Telephone Encounter (Signed)
Appeal was approved from 12/26/2015/-12/25/2016. Patient notified.

## 2015-12-26 NOTE — Telephone Encounter (Signed)
Patient states the pharm called her wanting to clarify that she is to be taking both the 150 mcg levothyroxine and 50 mcg levothyroxine. Let pharmacy know that patient is to take both per lab notes and clarified instructions. (see lab results 04/25/2015)

## 2015-12-29 ENCOUNTER — Ambulatory Visit: Payer: BC Managed Care – PPO | Admitting: Family Medicine

## 2016-01-24 ENCOUNTER — Other Ambulatory Visit: Payer: Self-pay

## 2016-01-24 MED ORDER — OMEGA-3-ACID ETHYL ESTERS 1 G PO CAPS: 2.0000 | ORAL_CAPSULE | Freq: Two times a day (BID) | ORAL | 2 refills | Status: DC

## 2016-01-25 ENCOUNTER — Other Ambulatory Visit: Payer: Self-pay | Admitting: *Deleted

## 2016-01-25 MED ORDER — LOSARTAN POTASSIUM-HCTZ 100-25 MG PO TABS: 1.0000 | ORAL_TABLET | Freq: Every day | ORAL | 1 refills | Status: DC

## 2016-01-26 ENCOUNTER — Other Ambulatory Visit: Payer: Self-pay | Admitting: *Deleted

## 2016-01-26 ENCOUNTER — Ambulatory Visit: Payer: BC Managed Care – PPO | Admitting: Family Medicine

## 2016-02-01 ENCOUNTER — Other Ambulatory Visit: Payer: Self-pay | Admitting: Family Medicine

## 2016-02-02 ENCOUNTER — Encounter: Payer: Self-pay | Admitting: *Deleted

## 2016-02-02 ENCOUNTER — Emergency Department
Admission: EM | Admit: 2016-02-02 | Discharge: 2016-02-02 | Disposition: A | Discharge status: Home / Self Care | Payer: BC Managed Care – PPO | Source: Home / Self Care | Attending: Family Medicine | Admitting: Family Medicine

## 2016-02-02 DIAGNOSIS — R69 Illness, unspecified: Secondary | ICD-10-CM | POA: Diagnosis not present

## 2016-02-02 DIAGNOSIS — J111 Influenza due to unidentified influenza virus with other respiratory manifestations: Secondary | ICD-10-CM

## 2016-02-02 MED ORDER — OSELTAMIVIR PHOSPHATE 75 MG PO CAPS: 75.0000 mg | ORAL_CAPSULE | Freq: Two times a day (BID) | ORAL | 0 refills | Status: DC

## 2016-02-02 MED ORDER — CEFDINIR 300 MG PO CAPS: 300.0000 mg | ORAL_CAPSULE | Freq: Two times a day (BID) | ORAL | 0 refills | Status: DC

## 2016-02-02 MED ORDER — BENZONATATE 200 MG PO CAPS
ORAL_CAPSULE | ORAL | 0 refills | Status: DC
Start: 2016-02-02 — End: 2016-02-19

## 2016-02-02 NOTE — ED Triage Notes (Signed)
Patient c/o sudden onset chills, aches, HA, sore throat and cough x 3 days. Taken Mucinex and Theraflu.

## 2016-02-02 NOTE — Discharge Instructions (Signed)
Take plain guaifenesin (1200mg  extended release tabs such as Mucinex) twice daily, with plenty of water, for cough and congestion. Get adequate rest.   May use Afrin nasal spray (or generic oxymetazoline) twice daily for about 5 days and then discontinue.  Also recommend using saline nasal spray several times daily and saline nasal irrigation (AYR is a common brand).  Use Flonase nasal spray each morning after using Afrin nasal spray and saline nasal irrigation. Try warm salt water gargles for sore throat.  Stop all antihistamines for now, and other non-prescription cough/cold preparations. May take Ibuprofen 200mg , 4 tabs every 8 hours with food for body aches, headache, fever, etc.   Follow-up with family doctor if not improving about one week.

## 2016-02-02 NOTE — ED Provider Notes (Signed)
Pamela Hollow[facility]    CSN: [id number] Arrival date & time: 02/02/16  1238     History   Chief Complaint Chief Complaint  Patient presents with  . Headache  . Generalized Body Aches  . Cough    HPI Pamela Holloway is a 56 y.o. female.   Complains of 3 day history flu-like illness including myalgias, headache, sweats/chills, fatigue, and cough.  Also has mild nasal congestion and sore throat.  Cough is non-productive and somewhat worse at night.  No pleuritic pain or shortness of breath.  She has not had a flu shot this season.  She has had mild nausea without vomiting, and diarrhea now resolved.    The history is provided by the patient and the spouse.    Past Medical History:  Diagnosis Date  . Diabetes mellitus without complication ([place])   . Family history of adverse reaction to anesthesia    son had n/v  . Hepatitis    h/o hep C, 2012 no signs  . Hyperlipidemia   . Hypertension   . Hypothyroidism   . Thyroid disease    Hypothyroid    Patient Active Problem List   Diagnosis Date Noted  . Primary localized osteoarthritis of right hip 08/28/2015  . Cubital tunnel syndrome on right 12/05/2014  . Left cervical radiculopathy 10/28/2014  . Degenerative disc disease, cervical 09/10/2013  . Thoracic degenerative disc disease 09/10/2013  . Osteoarthritis of right hip 08/20/2013  . Right lumbar radiculopathy 08/20/2013  . 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 Diabetes mellitus type 2, uncontrolled, without complications ([place]) [id number]  . History of hepatitis C virus infection 08/09/2008  . HYPERTENSION, BENIGN 07/05/2008  . Bariatric surgery status 06/29/2008  . Hypothyroidism 11/03/2006  . Hyperlipidemia 11/03/2006  . COMMON MIGRAINE 11/03/2006    Past Surgical History:  Procedure Laterality Date  . CHOLECYSTECTOMY    . LAPAROSCOPIC GASTRIC BANDING    . TONSILLECTOMY    . TOTAL HIP ARTHROPLASTY Right 08/28/2015   Procedure: TOTAL HIP ARTHROPLASTY ANTERIOR  APPROACH;  Surgeon: Frederik Pear, MD;  Location: [facility];  Service: Orthopedics;  Laterality: Right;    OB History    Gravida Para Term Preterm AB Living   2 2 2     2    SAB TAB Ectopic Multiple Live Births                   Home Medications    Prior to Admission medications   Medication Sig Start Date End Date Taking? Authorizing Provider  amitriptyline (ELAVIL) 50 MG tablet One half tab PO qHS for a week, then one tab PO qHS. 09/10/13   Silverio Decamp, MD  apixaban (ELIQUIS) 2.5 MG TABS tablet Take 1 tablet (2.5 mg total) by mouth 2 (two) times daily. 08/28/15   Leighton Parody, PA-C  benzonatate (TESSALON) 200 MG capsule Take one cap by mouth at bedtime as needed for cough.  May repeat in 4 to 6 hours 02/02/16   Kandra Nicolas, MD  cefdinir (OMNICEF) 300 MG capsule Take 1 capsule (300 mg total) by mouth 2 (two) times daily. 02/02/16   Kandra Nicolas, MD  cetirizine (ZYRTEC) 10 MG tablet Take 1 tablet (10 mg total) by mouth daily. 07/11/15   Noland Fordyce, PA-C  Dulaglutide (TRULICITY) 1.5 0000000 SOPN Inject 1.5 mg into the skin every 7 (seven) days. 12/19/15   Hali Marry, MD  glipiZIDE (GLUCOTROL) 10 MG tablet Take 1 tablet (10 mg total) by  mouth 2 (two) times daily before a meal. 11/10/15   Hali Marry, MD  hydrochlorothiazide (HYDRODIURIL) 25 MG tablet TAKE 1/2 TABLET DAILY 09/20/15   Hali Marry, MD  hydrOXYzine (ATARAX/VISTARIL) 25 MG tablet Take 1 tablet (25 mg total) by mouth every 6 (six) hours as needed for itching. 07/11/15   Noland Fordyce, PA-C  levothyroxine (SYNTHROID, LEVOTHROID) 150 MCG tablet Take 1 tablet (150 mcg total) by mouth daily before breakfast. 12/06/15   Hali Marry, MD  levothyroxine (SYNTHROID, LEVOTHROID) 50 MCG tablet TAKE 1 TABLET (50 MCG TOTAL) BY MOUTH EVERY 7 (SEVEN) DAYS. 11/02/15   Hali Marry, MD  lidocaine-prilocaine (EMLA) cream Apply 1 application topically as needed. Before injection 06/23/15   Hali Marry, MD  losartan-hydrochlorothiazide (HYZAAR) 100-25 MG tablet Take 1 tablet by mouth daily. 01/25/16   Hali Marry, MD  metFORMIN (GLUCOPHAGE) 1000 MG tablet Take 1 tablet (1,000 mg total) by mouth 2 (two) times daily with a meal. 09/29/15   Hali Marry, MD  methocarbamol (ROBAXIN) 500 MG tablet Take 1 tablet (500 mg total) by mouth 2 (two) times daily with a meal. 08/28/15   Leighton Parody, PA-C  omega-3 acid ethyl esters (LOVAZA) 1 g capsule Take 2 capsules (2 g total) by mouth 2 (two) times daily. 01/24/16   Hali Marry, MD  oxyCODONE-acetaminophen (ROXICET) 5-325 MG tablet Take 1 tablet by mouth every 4 (four) hours as needed. 08/28/15   Leighton Parody, PA-C  pravastatin (PRAVACHOL) 40 MG tablet Take 1 tablet (40 mg total) by mouth daily. 08/23/15   Hali Marry, MD  vitamin E 100 UNIT capsule Take by mouth daily.      Historical Provider, MD    Family History Family History  Problem Relation Age of Onset  . Diabetes Mother   . Hypertension Mother   . Heart disease Mother   . Diabetes Father   . Hypertension Father     Social History Social History  Substance Use Topics  . Smoking status: Former Smoker    Packs/day: 2.00    Years: 12.00    Types: Cigarettes  . Smokeless tobacco: Never Used  . Alcohol use 0.0 oz/week     Comment: rare     Allergies   Aspirin; Atorvastatin; Invokana [canagliflozin]; and Tine test [old tuberculin]   Review of Systems Review of Systems  + sore throat + cough No pleuritic pain No wheezing + nasal congestion + post-nasal drainage No sinus pain/pressure No itchy/red eyes ? earache No hemoptysis No SOB No fever, + chills/sweats + nausea No vomiting No abdominal pain + diarrhea No urinary symptoms No skin rash + fatigue + myalgias + headache Used OTC meds without relief   Physical Exam Triage Vital Signs ED Triage Vitals  Enc Vitals Group     BP 02/02/16 1330 156/90     Pulse Rate  02/02/16 1330 103     Resp 02/02/16 1330 16     Temp 02/02/16 1330 98.7 F (37.1 C)     Temp Source 02/02/16 1330 Oral     SpO2 02/02/16 1330 98 %     Weight 02/02/16 1332 197 lb (89.4 kg)     Height --      Head Circumference --      Peak Flow --      Pain Score 02/02/16 1332 5     Pain Loc --      Pain Edu? --  Excl. in GC? --    No data found.   Updated Vital Signs BP 156/90 (BP Location: Left Arm)   Pulse 103   Temp 98.7 F (37.1 C) (Oral)   Resp 16   Wt 197 lb (89.4 kg)   LMP 09/08/2011   SpO2 98%   BMI 33.81 kg/m   Visual Acuity Right Eye Distance:   Left Eye Distance:   Bilateral Distance:    Right Eye Near:   Left Eye Near:    Bilateral Near:     Physical Exam Nursing notes and Vital Signs reviewed. Appearance:  Patient appears stated age, and in no acute distress Eyes:  Pupils are equal, round, and reactive to light and accomodation.  Extraocular movement is intact.  Conjunctivae are not inflamed  Ears:  Canals normal.  Tympanic membranes normal but may have some serous effusions present. Nose:  Mildly congested turbinates.  No sinus tenderness.    Pharynx:  Normal Neck:  Supple.  Tender enlarged posterior/lateral nodes are palpated bilaterally  Lungs:  Clear to auscultation.  Breath sounds are equal.  Moving air well. Heart:  Regular rate and rhythm without murmurs, rubs, or gallops.  Abdomen:  Nontender without masses or hepatosplenomegaly.  Bowel sounds are present.  No CVA or flank tenderness.  Extremities:  No edema.  Skin:  No rash present.    UC Treatments / Results  Labs (all labs ordered are listed, but only abnormal results are displayed) Labs Reviewed - No data to display  EKG  EKG Interpretation None       Radiology No results found.  Procedures Procedures (including critical care time)  Medications Ordered in UC Medications - No data to display   Initial Impression / Assessment and Plan / UC Course  I have reviewed  the triage vital signs and the nursing notes.  Pertinent labs & imaging results that were available during my care of the patient were reviewed by me and considered in my medical decision making (see chart for details).  Clinical Course   Patient has missed window of opportunity for Tamiflu.  Will begin empiric Omnicef. Prescription written for Benzonatate [facility]) to take at bedtime for night-time cough.  Take plain guaifenesin (1200mg  extended release tabs such as Mucinex) twice daily, with plenty of water, for cough and congestion. Get adequate rest.   May use Afrin nasal spray (or generic oxymetazoline) twice daily for about 5 days and then discontinue.  Also recommend using saline nasal spray several times daily and saline nasal irrigation (AYR is a common brand).  Use Flonase nasal spray each morning after using Afrin nasal spray and saline nasal irrigation. Try warm salt water gargles for sore throat.  Stop all antihistamines for now, and other non-prescription cough/cold preparations. May take Ibuprofen 200mg , 4 tabs every 8 hours with food for body aches, headache, fever, etc.   Follow-up with family doctor if not improving about one week.     Final Clinical Impressions(s) / UC Diagnoses   Final diagnoses:  Influenza-like illness    New Prescriptions New Prescriptions   BENZONATATE (TESSALON) 200 MG CAPSULE    Take one cap by mouth at bedtime as needed for cough.  May repeat in 4 to 6 hours   CEFDINIR (OMNICEF) 300 MG CAPSULE    Take 1 capsule (300 mg total) by mouth 2 (two) times daily.     Kandra Nicolas, MD 02/02/16 1414

## 2016-02-19 ENCOUNTER — Ambulatory Visit (INDEPENDENT_AMBULATORY_CARE_PROVIDER_SITE_OTHER): Payer: BC Managed Care – PPO | Admitting: Family Medicine

## 2016-02-19 ENCOUNTER — Encounter: Payer: Self-pay | Admitting: Gynecology

## 2016-02-19 ENCOUNTER — Encounter: Payer: Self-pay | Admitting: Family Medicine

## 2016-02-19 ENCOUNTER — Ambulatory Visit (INDEPENDENT_AMBULATORY_CARE_PROVIDER_SITE_OTHER): Payer: BC Managed Care – PPO | Admitting: Gynecology

## 2016-02-19 VITALS — BP 136/84 | Ht 63.0 in | Wt 192.0 lb

## 2016-02-19 VITALS — BP 121/76 | HR 100 | Ht 64.0 in | Wt 196.0 lb

## 2016-02-19 DIAGNOSIS — R350 Frequency of micturition: Secondary | ICD-10-CM | POA: Diagnosis not present

## 2016-02-19 DIAGNOSIS — E1165 Type 2 diabetes mellitus with hyperglycemia: Secondary | ICD-10-CM | POA: Diagnosis not present

## 2016-02-19 DIAGNOSIS — R300 Dysuria: Secondary | ICD-10-CM

## 2016-02-19 DIAGNOSIS — N760 Acute vaginitis: Secondary | ICD-10-CM | POA: Diagnosis not present

## 2016-02-19 DIAGNOSIS — Z01411 Encounter for gynecological examination (general) (routine) with abnormal findings: Secondary | ICD-10-CM

## 2016-02-19 DIAGNOSIS — I10 Essential (primary) hypertension: Secondary | ICD-10-CM

## 2016-02-19 DIAGNOSIS — E039 Hypothyroidism, unspecified: Secondary | ICD-10-CM

## 2016-02-19 DIAGNOSIS — IMO0001 Reserved for inherently not codable concepts without codable children: Secondary | ICD-10-CM

## 2016-02-19 LAB — URINALYSIS W MICROSCOPIC + REFLEX CULTURE
Bilirubin Urine: NEGATIVE
CRYSTALS: NONE SEEN [HPF]
Casts: NONE SEEN [LPF]
Nitrite: NEGATIVE
Specific Gravity, Urine: 1.01 (ref 1.001–1.035)
pH: 5.5 (ref 5.0–8.0)

## 2016-02-19 LAB — WET PREP FOR TRICH, YEAST, CLUE
Clue Cells Wet Prep HPF POC: NONE SEEN
Trich, Wet Prep: NONE SEEN

## 2016-02-19 LAB — HM DIABETES EYE EXAM

## 2016-02-19 LAB — POCT GLYCOSYLATED HEMOGLOBIN (HGB A1C): Hemoglobin A1C: 9

## 2016-02-19 NOTE — Patient Instructions (Addendum)
Take the Diflucan pill daily for 5 days for the yeast infection. Hold your cholesterol medicine well taking.  Take the Macrodantin antibiotic twice daily for 7 days for the urinary infection.  Follow up if any of your symptoms persist or recur.

## 2016-02-19 NOTE — Addendum Note (Signed)
Addended by: Nelva Nay on: 02/19/2016 03:51 PM   Modules accepted: Orders

## 2016-02-19 NOTE — Progress Notes (Signed)
Subjective:    CC: HTN, DM  HPI: Hypertension- Pt denies chest pain, SOB, dizziness, or heart palpitations.  Taking meds as directed w/o problems.  Denies medication side effects.    Diabetes - no hypoglycemic events. No wounds or sores that are not healing well. No increased thirst or urination. Checking glucose at home. Taking medications as prescribed without any side effects.He said she wasn't able to take her medication consistently for over a week she had flulike symptoms and says she couldn't really keep much down. Not been exercising but reports that she is physically active.  Hypothyroidism - Doing well. No recent changes in energy level or weight. No skin or hair changes. Lab Results  Component Value Date   TSH 0.99 06/23/2015   Also had dysuria and frequency for several days. She is actually seeing her OB/GYN this afternoon and says she will address the issue there. No fevers chills with it.  Past medical history, Surgical history, Family history not pertinant except as noted below, Social history, Allergies, and medications have been entered into the medical record, reviewed, and corrections made.   Review of Systems: No fevers, chills, night sweats, weight loss, chest pain, or shortness of breath.   Objective:    General: Well Developed, well nourished, and in no acute distress.  Neuro: Alert and oriented x3, extra-ocular muscles intact, sensation grossly intact.  HEENT: Normocephalic, atraumatic  Skin: Warm and dry, no rashes. Cardiac: Regular rate and rhythm, no murmurs rubs or gallops, no lower extremity edema.  Respiratory: Clear to auscultation bilaterally. Not using accessory muscles, speaking in full sentences.   Impression and Recommendations:    HTN - Well controlled. Continue current regimen. Follow up in  3 mo.   DM - Uncontrolled. Hemoglobin A1c up to 9.0. Discussed options. She really doesn't want to make any changes to her medication regimen she just wants  to get back on track with diet and exercise and taking her medication consistently. Follow-up in 3 months. Will check urine microalbumin at next office visit.  Hypothyroidism-stable. No new symptoms. Due to recheck labs at next visit.  Dysuria-we'll address with her OB/GYN later today.

## 2016-02-19 NOTE — Progress Notes (Signed)
    Pamela Holloway 08-19-1960 [id number]        56 y.o.  G2P2002 for annual exam.  Also complaining of urinary frequency for the past 2 weeks. Notes vaginal irritation with discharge also for 2 weeks Pamela Holloway. Was recently treated with a course of antibiotics for respiratory issue. No fever or chills. No vaginal odor no low back pain nausea vomiting diarrhea constipation.  Past medical history,surgical history, problem list, medications, allergies, family history and social history were all reviewed and documented as reviewed in the EPIC chart.  ROS:  Performed with pertinent positives and negatives included in the history, assessment and plan.   Additional significant findings :  None   Exam: Pamela Holloway assistant Vitals:   02/19/16 1436  BP: 136/84  Weight: 192 lb (87.1 kg)  Height: 5\' 3"  (1.6 m)   Body mass index is 34.01 kg/m.  General appearance:  Normal affect, orientation and appearance. Skin: Grossly normal HEENT: Without gross lesions.  No cervical or supraclavicular adenopathy. Thyroid normal.  Lungs:  Clear without wheezing, rales or rhonchi Cardiac: RR, without RMG Abdominal:  Soft, nontender, without masses, guarding, rebound, organomegaly or hernia Breasts:  Examined lying and sitting without masses, retractions, discharge or axillary adenopathy. Pelvic:  Ext, BUS, Vagina with generalized vulvitis and vaginal erythema with white discharge.  Right labial hypertrophy  Cervix normal  Uterus anteverted, normal size, shape and contour, midline and mobile nontender   Adnexa without masses or tenderness    Anus and perineum normal   Rectovaginal normal sphincter tone without palpated masses or tenderness.    Assessment/Plan:  56 y.o. G18P2002 female for annual exam, postmenopausal, vasectomy birth control.   1. Vaginal discharge with intense vaginal/vulvar irritation. Wet prep consistent with yeast. Will treat with Diflucan 150 mg daily 5 days. Hold cholesterol  medicine well treated. Does have a history of recurrent yeast infections in the past. 2. Urinary frequency. Urinalysis consistent with UTI with 6-10 WBC and many bacteria. We'll follow up on culture but treat with Macrobid 100 mg twice a day 7 days. Follow up if symptoms persist, worsen or recur. 3. Right labial hypertrophy. Has always had this and is not bothersome to her. Stable on serial exams. 4. Mammography 04/2015. Continue with annual mammography when due. SBE monthly reviewed. 5. Colonoscopy 2017. Repeat at their recommended interval. 6. Pap smear 2015. Pap smear done today. No history of significant abnormal Pap smears. 7. Health maintenance. History of hepatitis C where she relates she was treated with medication and now screens negative. She is actively being followed by her primary physician with annual screening. Mild elevated blood pressure noted at 136/84. She's being followed for hypertension and frequently has these numbers. She'll continue to follow up with her primary in reference to this. No routine lab work done as this is done elsewhere. Follow up 1 year, sooner as needed.  Additional time in excess of her routine gynecologic exam was spent in direct face to face counseling and coordination of care in regards to her yeast vaginitis and UTI with evaluation and treatment rendered.    [name], 3:22 PM 02/19/2016

## 2016-02-20 ENCOUNTER — Telehealth: Payer: Self-pay | Admitting: *Deleted

## 2016-02-20 LAB — PAP IG W/ RFLX HPV ASCU

## 2016-02-20 LAB — URINE CULTURE: Organism ID, Bacteria: NO GROWTH

## 2016-02-20 MED ORDER — FLUCONAZOLE 150 MG PO TABS: 150.0000 mg | ORAL_TABLET | Freq: Every day | ORAL | 0 refills | Status: DC

## 2016-02-20 MED ORDER — NITROFURANTOIN MONOHYD MACRO 100 MG PO CAPS: 100.0000 mg | ORAL_CAPSULE | Freq: Two times a day (BID) | ORAL | 0 refills | Status: DC

## 2016-02-20 NOTE — Telephone Encounter (Signed)
Pt was seen yesterday for office visit prescribed Macrobid 100 mg twice a day 7 days and Diflucan 150 mg daily 5 days. Rx were never sent to pharmacy. I sent both Rx today, pt aware.

## 2016-02-21 ENCOUNTER — Emergency Department
Admission: EM | Admit: 2016-02-21 | Discharge: 2016-02-21 | Disposition: A | Discharge status: Home / Self Care | Payer: BC Managed Care – PPO | Source: Home / Self Care | Attending: Family Medicine | Admitting: Family Medicine

## 2016-02-21 ENCOUNTER — Encounter: Payer: Self-pay | Admitting: *Deleted

## 2016-02-21 DIAGNOSIS — B370 Candidal stomatitis: Secondary | ICD-10-CM | POA: Diagnosis not present

## 2016-02-21 LAB — POCT RAPID STREP A (OFFICE): Rapid Strep A Screen: NEGATIVE

## 2016-02-21 MED ORDER — CLOTRIMAZOLE 10 MG MT TROC: 10.0000 mg | Freq: Every day | OROMUCOSAL | 0 refills | Status: DC

## 2016-02-21 NOTE — ED Triage Notes (Signed)
Pt c/o white blisters on her throat x 1 day. She has taken several ABT's recently due to UTI. She currently has vaginal yeast was given Diflucan, but she has not started it yet.

## 2016-02-21 NOTE — ED Provider Notes (Signed)
[name]    CSN: [id number] Arrival date & time: 02/21/16  1712     History   Chief Complaint Chief Complaint  Patient presents with  . Sore Throat    HPI Pamela Holloway is a 56 y.o. female.   Patient was recently treated for UTI with Macrobid.  She had also been prescribed Diflucan but states that she has not started it yet. Two days ago she noticed "white bumps" on her throat with mild soreness.  No fevers, chills, and sweats.  She feels well otherwise.   The history is provided by the patient.    Past Medical History:  Diagnosis Date  . Diabetes mellitus without complication ([place])   . Family history of adverse reaction to anesthesia    son had n/v  . Hepatitis    h/o hep C, 2012 no signs  . Hyperlipidemia   . Hypertension   . Hypothyroidism   . Thyroid disease    Hypothyroid    Patient Active Problem List   Diagnosis Date Noted  . Primary localized osteoarthritis of right hip 08/28/2015  . Cubital tunnel syndrome on right 12/05/2014  . Left cervical radiculopathy 10/28/2014  . Degenerative disc disease, cervical 09/10/2013  . Thoracic degenerative disc disease 09/10/2013  . Osteoarthritis of right hip 08/20/2013  . Right lumbar radiculopathy 08/20/2013  . 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 Diabetes mellitus type 2, uncontrolled, without complications ([place]) [id number]  . History of hepatitis C virus infection 08/09/2008  . HYPERTENSION, BENIGN 07/05/2008  . Bariatric surgery status 06/29/2008  . Hypothyroidism 11/03/2006  . Hyperlipidemia 11/03/2006  . COMMON MIGRAINE 11/03/2006    Past Surgical History:  Procedure Laterality Date  . CHOLECYSTECTOMY    . LAPAROSCOPIC GASTRIC BANDING    . TONSILLECTOMY    . TOTAL HIP ARTHROPLASTY Right 08/28/2015   Procedure: TOTAL HIP ARTHROPLASTY ANTERIOR APPROACH;  Surgeon: Frederik Pear, MD;  Location: [place];  Service: Orthopedics;  Laterality: Right;    OB History    Gravida Para Term Preterm AB  Living   2 2 2     2    SAB TAB Ectopic Multiple Live Births                   Home Medications    Prior to Admission medications   Medication Sig Start Date End Date Taking? Authorizing Provider  amitriptyline (ELAVIL) 50 MG tablet One half tab PO qHS for a week, then one tab PO qHS. 09/10/13   Silverio Decamp, MD  clotrimazole (MYCELEX) 10 MG troche Take 1 tablet (10 mg total) by mouth 5 (five) times daily. Dissolve in mouth slowly; do not chew or swallow. 02/21/16   Kandra Nicolas, MD  Dulaglutide (TRULICITY) 1.5 0000000 SOPN Inject 1.5 mg into the skin every 7 (seven) days. 12/19/15   Hali Marry, MD  fluconazole (DIFLUCAN) 150 MG tablet Take 1 tablet (150 mg total) by mouth daily. 02/20/16   [name], MD  glipiZIDE (GLUCOTROL) 10 MG tablet Take 1 tablet (10 mg total) by mouth 2 (two) times daily before a meal. 11/10/15   Hali Marry, MD  hydrochlorothiazide (HYDRODIURIL) 25 MG tablet TAKE 1/2 TABLET DAILY 09/20/15   Hali Marry, MD  hydrOXYzine (ATARAX/VISTARIL) 25 MG tablet Take 1 tablet (25 mg total) by mouth every 6 (six) hours as needed for itching. 07/11/15   Noland Fordyce, PA-C  levothyroxine (SYNTHROID, LEVOTHROID) 150 MCG tablet Take 1 tablet (150 mcg total)  by mouth daily before breakfast. 12/06/15   Hali Marry, MD  levothyroxine (SYNTHROID, LEVOTHROID) 50 MCG tablet TAKE 1 TABLET (50 MCG TOTAL) BY MOUTH EVERY 7 (SEVEN) DAYS. 11/02/15   Hali Marry, MD  losartan-hydrochlorothiazide (HYZAAR) 100-25 MG tablet Take 1 tablet by mouth daily. 01/25/16   Hali Marry, MD  metFORMIN (GLUCOPHAGE) 1000 MG tablet Take 1 tablet (1,000 mg total) by mouth 2 (two) times daily with a meal. 09/29/15   Hali Marry, MD  nitrofurantoin, macrocrystal-monohydrate, (MACROBID) 100 MG capsule Take 1 capsule (100 mg total) by mouth 2 (two) times daily. 02/20/16   [name], MD  omega-3 acid ethyl esters (LOVAZA) 1 g capsule  Take 2 capsules (2 g total) by mouth 2 (two) times daily. 01/24/16   Hali Marry, MD  pravastatin (PRAVACHOL) 40 MG tablet Take 1 tablet (40 mg total) by mouth daily. 08/23/15   Hali Marry, MD  vitamin E 100 UNIT capsule Take by mouth daily.      Historical Provider, MD    Family History Family History  Problem Relation Age of Onset  . Diabetes Mother   . Hypertension Mother   . Heart disease Mother   . Diabetes Father   . Hypertension Father     Social History Social History  Substance Use Topics  . Smoking status: Former Smoker    Packs/day: 2.00    Years: 12.00    Types: Cigarettes  . Smokeless tobacco: Never Used  . Alcohol use 0.0 oz/week     Comment: rare     Allergies   Aspirin; Atorvastatin; Invokana [canagliflozin]; and Tine test [old tuberculin]   Review of Systems Review of Systems + sore throat No cough No pleuritic pain No wheezing No nasal congestion No post-nasal drainage No sinus pain/pressure No itchy/red eyes NO earache No hemoptysis No SOB No fever/chills No nausea No vomiting No abdominal pain No diarrhea No urinary symptoms No skin rash No fatigue No myalgias No headache   Physical Exam Triage Vital Signs ED Triage Vitals  Enc Vitals Group     BP 02/21/16 1743 158/86     Pulse Rate 02/21/16 1743 97     Resp 02/21/16 1743 18     Temp 02/21/16 1743 97.6 F (36.4 C)     Temp Source 02/21/16 1743 Oral     SpO2 02/21/16 1743 99 %     Weight 02/21/16 1744 192 lb (87.1 kg)     Height 02/21/16 1744 5\' 4"  (1.626 m)     Head Circumference --      Peak Flow --      Pain Score 02/21/16 1745 0     Pain Loc --      Pain Edu? --      Excl. in [place]? --    No data found.   Updated Vital Signs BP 158/86 (BP Location: Left Arm)   Pulse 97   Temp 97.6 F (36.4 C) (Oral)   Resp 18   Ht 5\' 4"  (1.626 m)   Wt 192 lb (87.1 kg)   LMP 09/29/2011   SpO2 99%   BMI 32.96 kg/m   Visual Acuity Right Eye Distance:     Left Eye Distance:   Bilateral Distance:    Right Eye Near:   Left Eye Near:    Bilateral Near:     Physical Exam Nursing notes and Vital Signs reviewed. Appearance:  Patient appears stated age, and in no acute distress  Eyes:  Pupils are equal, round, and reactive to light and accomodation.  Extraocular movement is intact.  Conjunctivae are not inflamed  Ears:  Canals normal.  Tympanic membranes normal.  Nose:   Normal turbinates.  No sinus tenderness.    Pharynx:  Mild erythema; scattered white plaques about 3 to 36mm diameter. Neck:  Supple. No adenopathy. Lungs:  Clear to auscultation.  Breath sounds are equal.  Moving air well. Heart:  Regular rate and rhythm without murmurs, rubs, or gallops.  Abdomen:  Nontender without masses or hepatosplenomegaly.  Bowel sounds are present.  No CVA or flank tenderness.  Extremities:  No edema.  Skin:  No rash present.    UC Treatments / Results  Labs (all labs ordered are listed, but only abnormal results are displayed) Labs Reviewed  POCT RAPID STREP A (OFFICE) negative    EKG  EKG Interpretation None       Radiology No results found.  Procedures Procedures (including critical care time)  Medications Ordered in UC Medications - No data to display   Initial Impression / Assessment and Plan / UC Course  I have reviewed the triage vital signs and the nursing notes.  Pertinent labs & imaging results that were available during my care of the patient were reviewed by me and considered in my medical decision making (see chart for details).    Begin Mycelex Troches Eat plain, unflavored yogurt. Check the label to make sure the yogurt contains live cultures. This yogurt can help healthy bacteria to grow in the mouth and can stop the growth of the fungus that causes thrush. Followup with Family Doctor if not improved in one week.     Final Clinical Impressions(s) / UC Diagnoses   Final diagnoses:  Oral pharyngeal candidiasis     New Prescriptions New Prescriptions   CLOTRIMAZOLE (MYCELEX) 10 MG TROCHE    Take 1 tablet (10 mg total) by mouth 5 (five) times daily. Dissolve in mouth slowly; do not chew or swallow.     Kandra Nicolas, MD 02/25/16 [phone]

## 2016-02-21 NOTE — Discharge Instructions (Signed)
Eat plain, unflavored yogurt. Check the label to make sure the yogurt contains live cultures. This yogurt can help healthy bacteria to grow in the mouth and can stop the growth of the fungus that causes thrush.

## 2016-02-28 ENCOUNTER — Other Ambulatory Visit: Payer: BC Managed Care – PPO

## 2016-02-28 ENCOUNTER — Telehealth: Payer: Self-pay | Admitting: *Deleted

## 2016-02-28 ENCOUNTER — Other Ambulatory Visit: Payer: Self-pay | Admitting: Gynecology

## 2016-02-28 DIAGNOSIS — N390 Urinary tract infection, site not specified: Secondary | ICD-10-CM

## 2016-02-28 LAB — URINALYSIS W MICROSCOPIC + REFLEX CULTURE
BILIRUBIN URINE: NEGATIVE
CRYSTALS: NONE SEEN [HPF]
Casts: NONE SEEN [LPF]
Ketones, ur: NEGATIVE
Leukocytes, UA: NEGATIVE
Nitrite: NEGATIVE
PROTEIN: NEGATIVE
Specific Gravity, Urine: 1.036 — ABNORMAL HIGH (ref 1.001–1.035)
YEAST: NONE SEEN [HPF]
pH: 5.5 (ref 5.0–8.0)

## 2016-02-28 NOTE — Telephone Encounter (Signed)
okay

## 2016-02-28 NOTE — Telephone Encounter (Signed)
Pt was treated for UTI on 02/19/16 Macrobid 100 mg twice a day 7 days pt completed Rx, would like to come back for repeat U/A she is not having any symptoms, but would like to confirm infection is gone. Please advise

## 2016-02-28 NOTE — Telephone Encounter (Signed)
Pt aware, order placed,coming today at 12:30

## 2016-03-01 LAB — URINE CULTURE

## 2016-03-12 ENCOUNTER — Encounter: Payer: Self-pay | Admitting: Family Medicine

## 2016-03-27 ENCOUNTER — Other Ambulatory Visit: Payer: Self-pay | Admitting: Gynecology

## 2016-03-27 DIAGNOSIS — Z1231 Encounter for screening mammogram for malignant neoplasm of breast: Secondary | ICD-10-CM

## 2016-04-22 ENCOUNTER — Other Ambulatory Visit: Payer: Self-pay | Admitting: Family Medicine

## 2016-04-23 ENCOUNTER — Other Ambulatory Visit: Payer: Self-pay | Admitting: Family Medicine

## 2016-04-25 ENCOUNTER — Other Ambulatory Visit: Payer: Self-pay | Admitting: *Deleted

## 2016-04-25 MED ORDER — LEVOTHYROXINE SODIUM 50 MCG PO TABS: 50.0000 ug | ORAL_TABLET | ORAL | 2 refills | Status: DC

## 2016-05-08 ENCOUNTER — Other Ambulatory Visit: Payer: Self-pay | Admitting: Family Medicine

## 2016-05-14 ENCOUNTER — Encounter (HOSPITAL_BASED_OUTPATIENT_CLINIC_OR_DEPARTMENT_OTHER): Payer: Self-pay

## 2016-05-14 ENCOUNTER — Ambulatory Visit (HOSPITAL_BASED_OUTPATIENT_CLINIC_OR_DEPARTMENT_OTHER)
Admission: RE | Admit: 2016-05-14 | Discharge: 2016-05-14 | Disposition: A | Discharge status: Home / Self Care | Payer: BC Managed Care – PPO | Source: Ambulatory Visit | Attending: Gynecology | Admitting: Gynecology

## 2016-05-14 DIAGNOSIS — Z1231 Encounter for screening mammogram for malignant neoplasm of breast: Secondary | ICD-10-CM | POA: Diagnosis present

## 2016-05-15 ENCOUNTER — Encounter: Payer: Self-pay | Admitting: Emergency Medicine

## 2016-05-15 ENCOUNTER — Emergency Department
Admission: EM | Admit: 2016-05-15 | Discharge: 2016-05-15 | Disposition: A | Discharge status: Other Acute Inpatient Hospital | Payer: BC Managed Care – PPO | Source: Home / Self Care | Attending: Family Medicine | Admitting: Family Medicine

## 2016-05-15 DIAGNOSIS — R040 Epistaxis: Secondary | ICD-10-CM

## 2016-05-15 DIAGNOSIS — J00 Acute nasopharyngitis [common cold]: Secondary | ICD-10-CM

## 2016-05-15 NOTE — Discharge Instructions (Signed)
Recommend beginning a non-sedating antihistamine such as Zyrtec daily.

## 2016-05-15 NOTE — ED Triage Notes (Signed)
Pt c/o nose bleed that lasted about 20 minutes this am. States last night she had a HA but no other sxs.

## 2016-05-15 NOTE — ED Provider Notes (Signed)
[facility]    CSN: [id number] Arrival date & time: 05/15/16  0175     History   Chief Complaint Chief Complaint  Patient presents with  . Epistaxis    HPI Pamela Holloway is a 56 y.o. female.   Patient reports onset of left side nosebleed at work this morning that lasted about 20 minutes.  The nosebleed stopped, but has now recurred.  She reports that she has felt congested for several weeks, especially at night in her right nares.  She had a mild headache last night, now resolved.   The history is provided by the patient and the spouse.  Epistaxis  Location:  L nare Severity:  Moderate Duration:  1 hour Timing:  Intermittent Progression:  Waxing and waning Chronicity:  New Context comment:  Nasal congestion Relieved by:  Nothing Worsened by:  Nothing Ineffective treatments:  Applying pressure Associated symptoms: blood in oropharynx and congestion   Associated symptoms: no cough, no dizziness, no facial pain, no fever, no headaches, no sinus pain, no sneezing, no sore throat and no syncope   Risk factors: allergies   Risk factors: no frequent nosebleeds     Past Medical History:  Diagnosis Date  . Diabetes mellitus without complication ([place])   . Family history of adverse reaction to anesthesia    son had n/v  . Hepatitis    h/o hep C, 2012 no signs  . Hyperlipidemia   . Hypertension   . Hypothyroidism   . Thyroid disease    Hypothyroid    Patient Active Problem List   Diagnosis Date Noted  . Primary localized osteoarthritis of right hip 08/28/2015  . Cubital tunnel syndrome on right 12/05/2014  . Left cervical radiculopathy 10/28/2014  . Degenerative disc disease, cervical 09/10/2013  . Thoracic degenerative disc disease 09/10/2013  . Osteoarthritis of right hip 08/20/2013  . Right lumbar radiculopathy 08/20/2013  . 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 Diabetes mellitus type 2, uncontrolled, without complications ([place]) 11/21/8525  . History of  hepatitis C virus infection 08/09/2008  . HYPERTENSION, BENIGN 07/05/2008  . Bariatric surgery status 06/29/2008  . Hypothyroidism 11/03/2006  . Hyperlipidemia 11/03/2006  . COMMON MIGRAINE 11/03/2006    Past Surgical History:  Procedure Laterality Date  . CHOLECYSTECTOMY    . LAPAROSCOPIC GASTRIC BANDING    . TONSILLECTOMY    . TOTAL HIP ARTHROPLASTY Right 08/28/2015   Procedure: TOTAL HIP ARTHROPLASTY ANTERIOR APPROACH;  Surgeon: Frederik Pear, MD;  Location: [facility];  Service: Orthopedics;  Laterality: Right;    OB History    Gravida Para Term Preterm AB Living   2 2 2     2    SAB TAB Ectopic Multiple Live Births                   Home Medications    Prior to Admission medications   Medication Sig Start Date End Date Taking? Authorizing Provider  amitriptyline (ELAVIL) 50 MG tablet One half tab PO qHS for a week, then one tab PO qHS. 09/10/13   Silverio Decamp, MD  clotrimazole (MYCELEX) 10 MG troche Take 1 tablet (10 mg total) by mouth 5 (five) times daily. Dissolve in mouth slowly; do not chew or swallow. 02/21/16   Kandra Nicolas, MD  Dulaglutide (TRULICITY) 1.5 PO/2.4MP SOPN Inject 1.5 mg into the skin every 7 (seven) days. 12/19/15   Hali Marry, MD  fluconazole (DIFLUCAN) 150 MG tablet Take 1 tablet (150 mg total) by  mouth daily. 02/20/16   [name], MD  glipiZIDE (GLUCOTROL) 10 MG tablet Take 1 tablet (10 mg total) by mouth 2 (two) times daily before a meal. 11/10/15   Hali Marry, MD  glipiZIDE (GLUCOTROL) 10 MG tablet Take 1 tablet (10 mg total) by mouth 2 (two) times daily before a meal. NEED FOLLOW UP APPOINTMENT FOR MORE REFILLS 05/10/16   Hali Marry, MD  hydrochlorothiazide (HYDRODIURIL) 25 MG tablet TAKE 1/2 TABLET DAILY 04/23/16   Hali Marry, MD  hydrOXYzine (ATARAX/VISTARIL) 25 MG tablet Take 1 tablet (25 mg total) by mouth every 6 (six) hours as needed for itching. 07/11/15   Noland Fordyce, PA-C  levothyroxine  (SYNTHROID, LEVOTHROID) 150 MCG tablet Take 1 tablet (150 mcg total) by mouth daily before breakfast. 12/06/15   Hali Marry, MD  levothyroxine (SYNTHROID, LEVOTHROID) 50 MCG tablet Take 1 tablet (50 mcg total) by mouth every 7 (seven) days. 04/25/16   Hali Marry, MD  losartan-hydrochlorothiazide (HYZAAR) 100-25 MG tablet Take 1 tablet by mouth daily. 01/25/16   Hali Marry, MD  metFORMIN (GLUCOPHAGE) 1000 MG tablet Take 1 tablet (1,000 mg total) by mouth 2 (two) times daily with a meal. 09/29/15   Hali Marry, MD  nitrofurantoin, macrocrystal-monohydrate, (MACROBID) 100 MG capsule Take 1 capsule (100 mg total) by mouth 2 (two) times daily. 02/20/16   [name], MD  omega-3 acid ethyl esters (LOVAZA) 1 g capsule Take 2 capsules (2 g total) by mouth 2 (two) times daily. 01/24/16   Hali Marry, MD  pravastatin (PRAVACHOL) 40 MG tablet TAKE 1 TABLET DAILY 04/24/16   Hali Marry, MD  vitamin E 100 UNIT capsule Take by mouth daily.      Historical Provider, MD    Family History Family History  Problem Relation Age of Onset  . Diabetes Mother   . Hypertension Mother   . Heart disease Mother   . Diabetes Father   . Hypertension Father     Social History Social History  Substance Use Topics  . Smoking status: Former Smoker    Packs/day: 2.00    Years: 12.00    Types: Cigarettes  . Smokeless tobacco: Never Used  . Alcohol use 0.0 oz/week     Comment: rare     Allergies   Aspirin; Atorvastatin; Invokana [canagliflozin]; and Tine test [old tuberculin]   Review of Systems Review of Systems  Constitutional: Negative for fever.  HENT: Positive for congestion and nosebleeds. Negative for sinus pain, sneezing and sore throat.   Respiratory: Negative for cough.   Cardiovascular: Negative for syncope.  Neurological: Negative for dizziness and headaches.  All other systems reviewed and are negative.    Physical Exam Triage Vital  Signs ED Triage Vitals  Enc Vitals Group     BP 05/15/16 0949 (!) 148/86     Pulse Rate 05/15/16 0949 86     Resp --      Temp 05/15/16 0949 97.8 F (36.6 C)     Temp Source 05/15/16 0949 Oral     SpO2 05/15/16 0949 99 %     Weight 05/15/16 0949 194 lb (88 kg)     Height --      Head Circumference --      Peak Flow --      Pain Score 05/15/16 0950 0     Pain Loc --      Pain Edu? --      Excl. in  [place]? --    No data found.   Updated Vital Signs BP (!) 148/86 (BP Location: Right Arm)   Pulse 86   Temp 97.8 F (36.6 C) (Oral)   Wt 194 lb (88 kg)   LMP 09/29/2011   SpO2 99%   BMI 33.30 kg/m   Visual Acuity Right Eye Distance:   Left Eye Distance:   Bilateral Distance:    Right Eye Near:   Left Eye Near:    Bilateral Near:     Physical Exam  Constitutional: She appears well-developed and well-nourished. No distress.  HENT:  Head: Normocephalic.  Right Ear: External ear normal.  Left Ear: External ear normal.  Nose: Mucosal edema present. Epistaxis is observed. Foreign body is present.  Mouth/Throat: Oropharynx is clear and moist.  Patient with left sided anterior epistaxis initially.  After hemostasis achieved, turbinates appear congested.  No obvious bleeding points noted.  Eyes: Conjunctivae are normal. Pupils are equal, round, and reactive to light.  Neck: Neck supple.  Cardiovascular: Normal rate.   Pulmonary/Chest: Effort normal.  Lymphadenopathy:    She has no cervical adenopathy.  Neurological: She is alert.  Skin: Skin is warm and dry.  Nursing note and vitals reviewed.    UC Treatments / Results  Labs (all labs ordered are listed, but only abnormal results are displayed) Labs Reviewed - No data to display  EKG  EKG Interpretation None      Radiology   Procedures Procedures (including critical care time)  Medications Ordered in UC Medications - No data to display   Initial Impression / Assessment and Plan / UC Course  I have  reviewed the triage vital signs and the nursing notes.  Pertinent labs & imaging results that were available during my care of the patient were reviewed by me and considered in my medical decision making (see chart for details).    Sprayed Afrin nasal spray into both nares, and patient instructed in proper nasal squeezing technique. Hemostasis achieved after nasal squeezing for 15 minutes.  Exam of nares revealed congested turbinates, but no distinct bleeding point.  After about 10 minutes left epistaxis recurred. Patient referred to [facility] for immediate evaluation. Suspect that patient's epistaxis a result of seasonal rhinitis.  Final Clinical Impressions(s) / UC Diagnoses   Final diagnoses:  Left-sided epistaxis  Acute rhinitis    New Prescriptions New Prescriptions   No medications on file     Kandra Nicolas, MD 05/15/16 1114

## 2016-05-24 ENCOUNTER — Encounter: Payer: Self-pay | Admitting: Family Medicine

## 2016-05-24 ENCOUNTER — Ambulatory Visit (INDEPENDENT_AMBULATORY_CARE_PROVIDER_SITE_OTHER): Payer: BC Managed Care – PPO | Admitting: Family Medicine

## 2016-05-24 VITALS — BP 139/69 | HR 68 | Ht 64.0 in | Wt 198.0 lb

## 2016-05-24 DIAGNOSIS — E1165 Type 2 diabetes mellitus with hyperglycemia: Secondary | ICD-10-CM

## 2016-05-24 DIAGNOSIS — I10 Essential (primary) hypertension: Secondary | ICD-10-CM

## 2016-05-24 DIAGNOSIS — IMO0001 Reserved for inherently not codable concepts without codable children: Secondary | ICD-10-CM

## 2016-05-24 LAB — POCT UA - MICROALBUMIN
Creatinine, POC: 50 mg/dL
Microalbumin Ur, POC: 80 mg/L

## 2016-05-24 LAB — POCT GLYCOSYLATED HEMOGLOBIN (HGB A1C): Hemoglobin A1C: 9

## 2016-05-24 MED ORDER — DAPAGLIFLOZIN PROPANEDIOL 5 MG PO TABS: 5.0000 mg | ORAL_TABLET | Freq: Every day | ORAL | 2 refills | Status: DC

## 2016-05-24 NOTE — Progress Notes (Signed)
Subjective:    CC: DM  HPI:  Diabetes - no hypoglycemic events. No wounds or sores that are not healing well. No increased thirst or urination. Checking glucose at home. Taking medications as prescribed without any side effects.She actually reports that she's been started taking take an over-the-counter supplement called Glucio.  She says it was actually lowering her blood sugars by about 30 points. She took it for a brief period of time and has just ordered a new supply and plans on starting it again. She's not exercising regularly. He sometimes gets behind on doing her Trulicity injection. Most the time it's 810 days between shots instead of every 7 days.  Hypertension- Pt denies chest pain, SOB, dizziness, or heart palpitations.  Taking meds as directed w/o problems.  Denies medication side effects.      Past medical history, Surgical history, Family history not pertinant except as noted below, Social history, Allergies, and medications have been entered into the medical record, reviewed, and corrections made.   Review of Systems: No fevers, chills, night sweats, weight loss, chest pain, or shortness of breath.   Objective:    General: Well Developed, well nourished, and in no acute distress.  Neuro: Alert and oriented x3, extra-ocular muscles intact, sensation grossly intact.  HEENT: Normocephalic, atraumatic  Skin: Warm and dry, no rashes. Cardiac: Regular rate and rhythm, no murmurs rubs or gallops, no lower extremity edema.  Respiratory: Clear to auscultation bilaterally. Not using accessory muscles, speaking in full sentences.   Impression and Recommendations:    DM- uncontrolled. A1C is up to 9.0  Same from last OV.  Discussed options. In the past she tried [place] but it caused recurrent UTIs. Discussed the option of trying for the go or Jardiance. Warned that they can also cause UTIs it may be worth trying she had a fantastic response in the, and helping to control her blood  sugar. She is adamantly opposed to doing another injection. Did encourage her to use her Trulicity more consistently and also encourage more regular exercise.   HTN - Well controlled. Continue current regimen. Follow up in  3-4 weeks.

## 2016-06-07 ENCOUNTER — Other Ambulatory Visit: Payer: Self-pay | Admitting: Family Medicine

## 2016-06-12 ENCOUNTER — Encounter: Payer: Self-pay | Admitting: Gynecology

## 2016-07-10 ENCOUNTER — Other Ambulatory Visit: Payer: Self-pay | Admitting: Family Medicine

## 2016-07-11 ENCOUNTER — Other Ambulatory Visit: Payer: Self-pay | Admitting: Family Medicine

## 2016-07-11 ENCOUNTER — Encounter: Payer: Self-pay | Admitting: Family Medicine

## 2016-07-11 ENCOUNTER — Ambulatory Visit (INDEPENDENT_AMBULATORY_CARE_PROVIDER_SITE_OTHER): Payer: BC Managed Care – PPO | Admitting: Family Medicine

## 2016-07-11 VITALS — BP 126/67 | HR 91 | Ht 63.39 in | Wt 188.0 lb

## 2016-07-11 DIAGNOSIS — D0362 Melanoma in situ of left upper limb, including shoulder: Secondary | ICD-10-CM

## 2016-07-11 DIAGNOSIS — N390 Urinary tract infection, site not specified: Secondary | ICD-10-CM

## 2016-07-11 DIAGNOSIS — E1165 Type 2 diabetes mellitus with hyperglycemia: Secondary | ICD-10-CM | POA: Diagnosis not present

## 2016-07-11 DIAGNOSIS — I10 Essential (primary) hypertension: Secondary | ICD-10-CM

## 2016-07-11 DIAGNOSIS — IMO0001 Reserved for inherently not codable concepts without codable children: Secondary | ICD-10-CM

## 2016-07-11 DIAGNOSIS — R300 Dysuria: Secondary | ICD-10-CM | POA: Diagnosis not present

## 2016-07-11 LAB — POCT URINALYSIS DIPSTICK
Bilirubin, UA: NEGATIVE
Blood, UA: NEGATIVE
KETONES UA: NEGATIVE
NITRITE UA: NEGATIVE
PH UA: 5.5 (ref 5.0–8.0)
Protein, UA: NEGATIVE
Spec Grav, UA: <=1.005 — AB (ref 1.010–1.025)
Urobilinogen, UA: 0.2 E.U./dL

## 2016-07-11 LAB — GLUCOSE, POCT (MANUAL RESULT ENTRY): POC GLUCOSE: 176 mg/dL — AB (ref 70–99)

## 2016-07-11 MED ORDER — LEVOTHYROXINE SODIUM 50 MCG PO TABS: 50.0000 ug | ORAL_TABLET | ORAL | 2 refills | Status: DC

## 2016-07-11 MED ORDER — LEVOTHYROXINE SODIUM 150 MCG PO TABS: 150.0000 ug | ORAL_TABLET | Freq: Every day | ORAL | 1 refills | Status: DC

## 2016-07-11 MED ORDER — DAPAGLIFLOZIN PROPANEDIOL 5 MG PO TABS: 5.0000 mg | ORAL_TABLET | Freq: Every day | ORAL | 1 refills | Status: DC

## 2016-07-11 NOTE — Progress Notes (Addendum)
Subjective:    CC:   HPI: Diabetes - no hypoglycemic events. No wounds or sores that are not healing well. No increased thirst or urination. Checking glucose at home. Taking medications as prescribed without any side effects.A1c was uncontrolled at 9.0 at the last office visit in April. She had recurrent UTIs with Advil, so decided to try for the gut as she did have a really good response to the Invokana in the past in controlling her blood sugars. Safar she's been doing okay with the go. She occasionally will feel little external urethral irritation and is just been using a cream that does seem to help. She is scheduled for a PET scan today as she was recently diagnosed with metastatic melanoma. And she will start treatments next week after getting a port placed.  Hypertension- Pt denies chest pain, SOB, dizziness, or heart palpitations.  Taking meds as directed w/o problems.  Denies medication side effects.     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   Review of Systems: No fevers, chills, night sweats, weight loss, chest pain, or shortness of breath.   Objective:    General: Well Developed, well nourished, and in no acute distress.  Neuro: Alert and oriented x3, extra-ocular muscles intact, sensation grossly intact.  HEENT: Normocephalic, atraumatic  Skin: Warm and dry, no rashes.Incision on left inner arm is healing well and excision under the left  axilla is healing well too.   Cardiac: Regular rate and rhythm, no murmurs rubs or gallops, no lower extremity edema.  Respiratory: Clear to auscultation bilaterally. Not using accessory muscles, speaking in full sentences.   Impression and Recommendations:    DM- Sounds like her blood sugars are trending in the right direction. Still mostly around the 160 range. I do think she would benefit from increasing her for the gabapentin a little bit nervous about doing that especially since she is getting ready to start her treatments for melanoma so for now we'll just continue work on healthy diet exercise and stick with the 5 mg of the pharmacy go. Plus she admits she has been doing her Trulicity much more consistently now and that seems to be helping as well. Continue to work on [facility].  HTN - Well controlled. Continue current regimen. Follow up in    Dysuria-not sure if it's just a little urethral irritation or if she actually possibly has a UTI but I would like to make sure before she starts her treatments. Her analysis performed. Will send for micro review.    Melanoma in situ-PET scan scheduled today. And she will start antibiotic therapy next week. I will fill out FMLA paperwork so that she can work shorter days. Typically in the summertime they work 4 days a week for 10 hours a day. She will feels like she just can't do that. Will reduce down to 8 hours a day knowing that she will use her vacation time.  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

## 2016-07-12 ENCOUNTER — Other Ambulatory Visit: Payer: Self-pay | Admitting: Family Medicine

## 2016-07-12 LAB — CMP 10231
AG Ratio: 1.6 Ratio (ref 1.0–2.5)
ALT: 74 U/L — ABNORMAL HIGH (ref 6–29)
AST: 63 U/L — ABNORMAL HIGH (ref 10–35)
Albumin: 4.6 g/dL (ref 3.6–5.1)
Alkaline Phosphatase: 112 U/L (ref 33–130)
BUN/Creatinine Ratio: 13.5 Ratio (ref 6–22)
BUN: 14 mg/dL (ref 7–25)
CHLORIDE: 97 mmol/L — AB (ref 98–110)
CO2: 26 mmol/L (ref 20–31)
CREATININE: 1.04 mg/dL (ref 0.50–1.05)
Calcium: 9.8 mg/dL (ref 8.6–10.4)
GFR, Est African American: 69 mL/min (ref >=60)
GFR, Est Non African American: 60 mL/min (ref >=60)
Globulin: 2.9 g/dL (ref 1.9–3.7)
Glucose, Bld: 216 mg/dL — ABNORMAL HIGH (ref 65–99)
Potassium: 3.7 mmol/L (ref 3.5–5.3)
SODIUM: 137 mmol/L (ref 135–146)
TOTAL PROTEIN: 7.5 g/dL (ref 6.1–8.1)
Total Bilirubin: 1.2 mg/dL (ref 0.2–1.2)

## 2016-07-12 LAB — LIPID PANEL W/REFLEX DIRECT LDL
CHOL/HDL RATIO: 6.5 ratio — AB (ref <5.0)
CHOLESTEROL: 200 mg/dL — AB (ref <200)
HDL: 31 mg/dL — ABNORMAL LOW (ref >50)
NON-HDL CHOLESTEROL (CALC): 169 mg/dL — AB (ref <130)
Triglycerides: 429 mg/dL — ABNORMAL HIGH (ref <150)

## 2016-07-12 LAB — LDL CHOLESTEROL, DIRECT: Direct LDL: 102 mg/dL — ABNORMAL HIGH (ref <100)

## 2016-07-13 LAB — URINE CULTURE

## 2016-07-15 MED ORDER — CIPROFLOXACIN HCL 500 MG PO TABS: 500.0000 mg | ORAL_TABLET | Freq: Two times a day (BID) | ORAL | 0 refills | Status: DC

## 2016-07-15 NOTE — Addendum Note (Signed)
Addended by: Maryla Morrow on: 07/15/2016 12:56 PM   Modules accepted: Orders

## 2016-07-23 DIAGNOSIS — Z95828 Presence of other vascular implants and grafts: Secondary | ICD-10-CM | POA: Insufficient documentation

## 2016-07-24 DIAGNOSIS — Z5112 Encounter for antineoplastic immunotherapy: Secondary | ICD-10-CM | POA: Insufficient documentation

## 2016-07-29 ENCOUNTER — Other Ambulatory Visit: Payer: Self-pay | Admitting: Family Medicine

## 2016-08-22 ENCOUNTER — Encounter: Payer: Self-pay | Admitting: Family Medicine

## 2016-08-22 ENCOUNTER — Ambulatory Visit (INDEPENDENT_AMBULATORY_CARE_PROVIDER_SITE_OTHER): Payer: BC Managed Care – PPO | Admitting: Family Medicine

## 2016-08-22 VITALS — BP 135/72 | HR 75 | Wt 188.0 lb

## 2016-08-22 DIAGNOSIS — IMO0001 Reserved for inherently not codable concepts without codable children: Secondary | ICD-10-CM

## 2016-08-22 DIAGNOSIS — I10 Essential (primary) hypertension: Secondary | ICD-10-CM | POA: Diagnosis not present

## 2016-08-22 DIAGNOSIS — R300 Dysuria: Secondary | ICD-10-CM | POA: Diagnosis not present

## 2016-08-22 DIAGNOSIS — N898 Other specified noninflammatory disorders of vagina: Secondary | ICD-10-CM

## 2016-08-22 DIAGNOSIS — E039 Hypothyroidism, unspecified: Secondary | ICD-10-CM

## 2016-08-22 DIAGNOSIS — E1165 Type 2 diabetes mellitus with hyperglycemia: Secondary | ICD-10-CM | POA: Diagnosis not present

## 2016-08-22 DIAGNOSIS — L298 Other pruritus: Secondary | ICD-10-CM | POA: Diagnosis not present

## 2016-08-22 DIAGNOSIS — R748 Abnormal levels of other serum enzymes: Secondary | ICD-10-CM | POA: Diagnosis not present

## 2016-08-22 LAB — POCT URINALYSIS DIPSTICK
Bilirubin, UA: NEGATIVE
Blood, UA: NEGATIVE
Glucose, UA: >=1000
KETONES UA: NEGATIVE
LEUKOCYTES UA: NEGATIVE
Nitrite, UA: NEGATIVE
PH UA: 6 (ref 5.0–8.0)
PROTEIN UA: NEGATIVE
UROBILINOGEN UA: 0.2 U/dL

## 2016-08-22 LAB — WET PREP FOR TRICH, YEAST, CLUE
CLUE CELLS WET PREP: NONE SEEN
Trich, Wet Prep: NONE SEEN

## 2016-08-22 LAB — HEPATIC FUNCTION PANEL
ALT: 15 U/L (ref 6–29)
AST: 16 U/L (ref 10–35)
Albumin: 3.9 g/dL (ref 3.6–5.1)
Alkaline Phosphatase: 66 U/L (ref 33–130)
BILIRUBIN DIRECT: 0.1 mg/dL (ref <=0.2)
Indirect Bilirubin: 0.4 mg/dL (ref 0.2–1.2)
Total Bilirubin: 0.5 mg/dL (ref 0.2–1.2)
Total Protein: 7.2 g/dL (ref 6.1–8.1)

## 2016-08-22 LAB — TSH: TSH: 1.21 m[IU]/L

## 2016-08-22 LAB — POCT GLYCOSYLATED HEMOGLOBIN (HGB A1C): HEMOGLOBIN A1C: 7.5

## 2016-08-22 MED ORDER — NYSTATIN-TRIAMCINOLONE 100000-0.1 UNIT/GM-% EX OINT: 1.0000 "application " | TOPICAL_OINTMENT | Freq: Two times a day (BID) | CUTANEOUS | 1 refills | Status: DC

## 2016-08-22 MED ORDER — FLUCONAZOLE 150 MG PO TABS: 150.0000 mg | ORAL_TABLET | Freq: Once | ORAL | 1 refills | Status: AC

## 2016-08-22 MED ORDER — DAPAGLIFLOZIN PROPANEDIOL 10 MG PO TABS: 10.0000 mg | ORAL_TABLET | Freq: Every day | ORAL | 5 refills | Status: DC

## 2016-08-22 NOTE — Patient Instructions (Signed)
Increase Farxiga to 10mg .  New script sent.

## 2016-08-22 NOTE — Progress Notes (Signed)
Subjective:    CC: DM, HTN  HPI:  Diabetes - no hypoglycemic events. No wounds or sores that are not healing well. No increased thirst or urination. Checking glucose at home. Taking medications as prescribed without any side effects.  Hypertension- Pt denies chest pain, SOB, dizziness, or heart palpitations.  Taking meds as directed w/o problems.  Denies medication side effects.    Hypothyroidism - No recent skin or hair changes.  No significant changes in weight. Weight stable at 188. BMI/33.  Urinary frequency  For several days.  She's also noticed a little irritation on the outside. Not so much of a burning on the inside when she urinates. No lower pelvic pain. No fever. She's also had a little external itching. She is [place].    Past medical history, Surgical history, Family history not pertinant except as noted below, Social history, Allergies, and medications have been entered into the medical record, reviewed, and corrections made.   Review of Systems: No fevers, chills, night sweats, weight loss, chest pain, or shortness of breath.   Objective:    General: Well Developed, well nourished, and in no acute distress.  Neuro: Alert and oriented x3, extra-ocular muscles intact, sensation grossly intact.  HEENT: Normocephalic, atraumatic  Skin: Warm and dry, no rashes. Cardiac: Regular rate and rhythm, no murmurs rubs or gallops, no lower extremity edema.  Respiratory: Clear to auscultation bilaterally. Not using accessory muscles, speaking in full sentences.   Impression and Recommendations:    DM - Well controlled. Increase Farxiga to 10 mg. New prescription sent to pharmacy. Continue to work on healthy diet and regular exercise. Follow up in  3 months. Much improved. A1C down to 7.5.   HTN - Well controlled. Continue current regimen. Follow up in  3-4 months.    Hypothyroidism - due to recheck   Elevated liver enzymes - recheck levels.   Urinary frequency with some vaginal  irritation-urinalysis and wet prep performed today. Will call with results once available. UA is negative.

## 2016-08-22 NOTE — Addendum Note (Signed)
Addended by: Beatrice Lecher D on: 08/22/2016 10:12 PM   Modules accepted: Orders

## 2016-08-25 DIAGNOSIS — C779 Secondary and unspecified malignant neoplasm of lymph node, unspecified: Secondary | ICD-10-CM | POA: Insufficient documentation

## 2016-08-25 DIAGNOSIS — C439 Malignant melanoma of skin, unspecified: Secondary | ICD-10-CM | POA: Insufficient documentation

## 2016-09-02 ENCOUNTER — Other Ambulatory Visit: Payer: Self-pay

## 2016-09-02 ENCOUNTER — Other Ambulatory Visit: Payer: Self-pay | Admitting: Family Medicine

## 2016-09-02 MED ORDER — DULAGLUTIDE 1.5 MG/0.5ML ~~LOC~~ SOAJ: 1.5000 mg | SUBCUTANEOUS | 5 refills | Status: DC

## 2016-10-03 ENCOUNTER — Other Ambulatory Visit: Payer: Self-pay | Admitting: *Deleted

## 2016-10-03 DIAGNOSIS — IMO0001 Reserved for inherently not codable concepts without codable children: Secondary | ICD-10-CM

## 2016-10-03 DIAGNOSIS — E1165 Type 2 diabetes mellitus with hyperglycemia: Principal | ICD-10-CM

## 2016-10-03 MED ORDER — DULAGLUTIDE 1.5 MG/0.5ML ~~LOC~~ SOAJ: 1.5000 mg | SUBCUTANEOUS | 5 refills | Status: DC

## 2016-10-24 IMAGING — CR DG CHEST 2V
2 series · 2 of 2 positions shown · non-contrast
Comparison: Portable chest x-ray February 11, 2011

CLINICAL DATA: Preoperative exam prior to right hip joint
replacement ; ex-smoker, diabetes

EXAM:
CHEST  2 VIEW

[w chest pa]
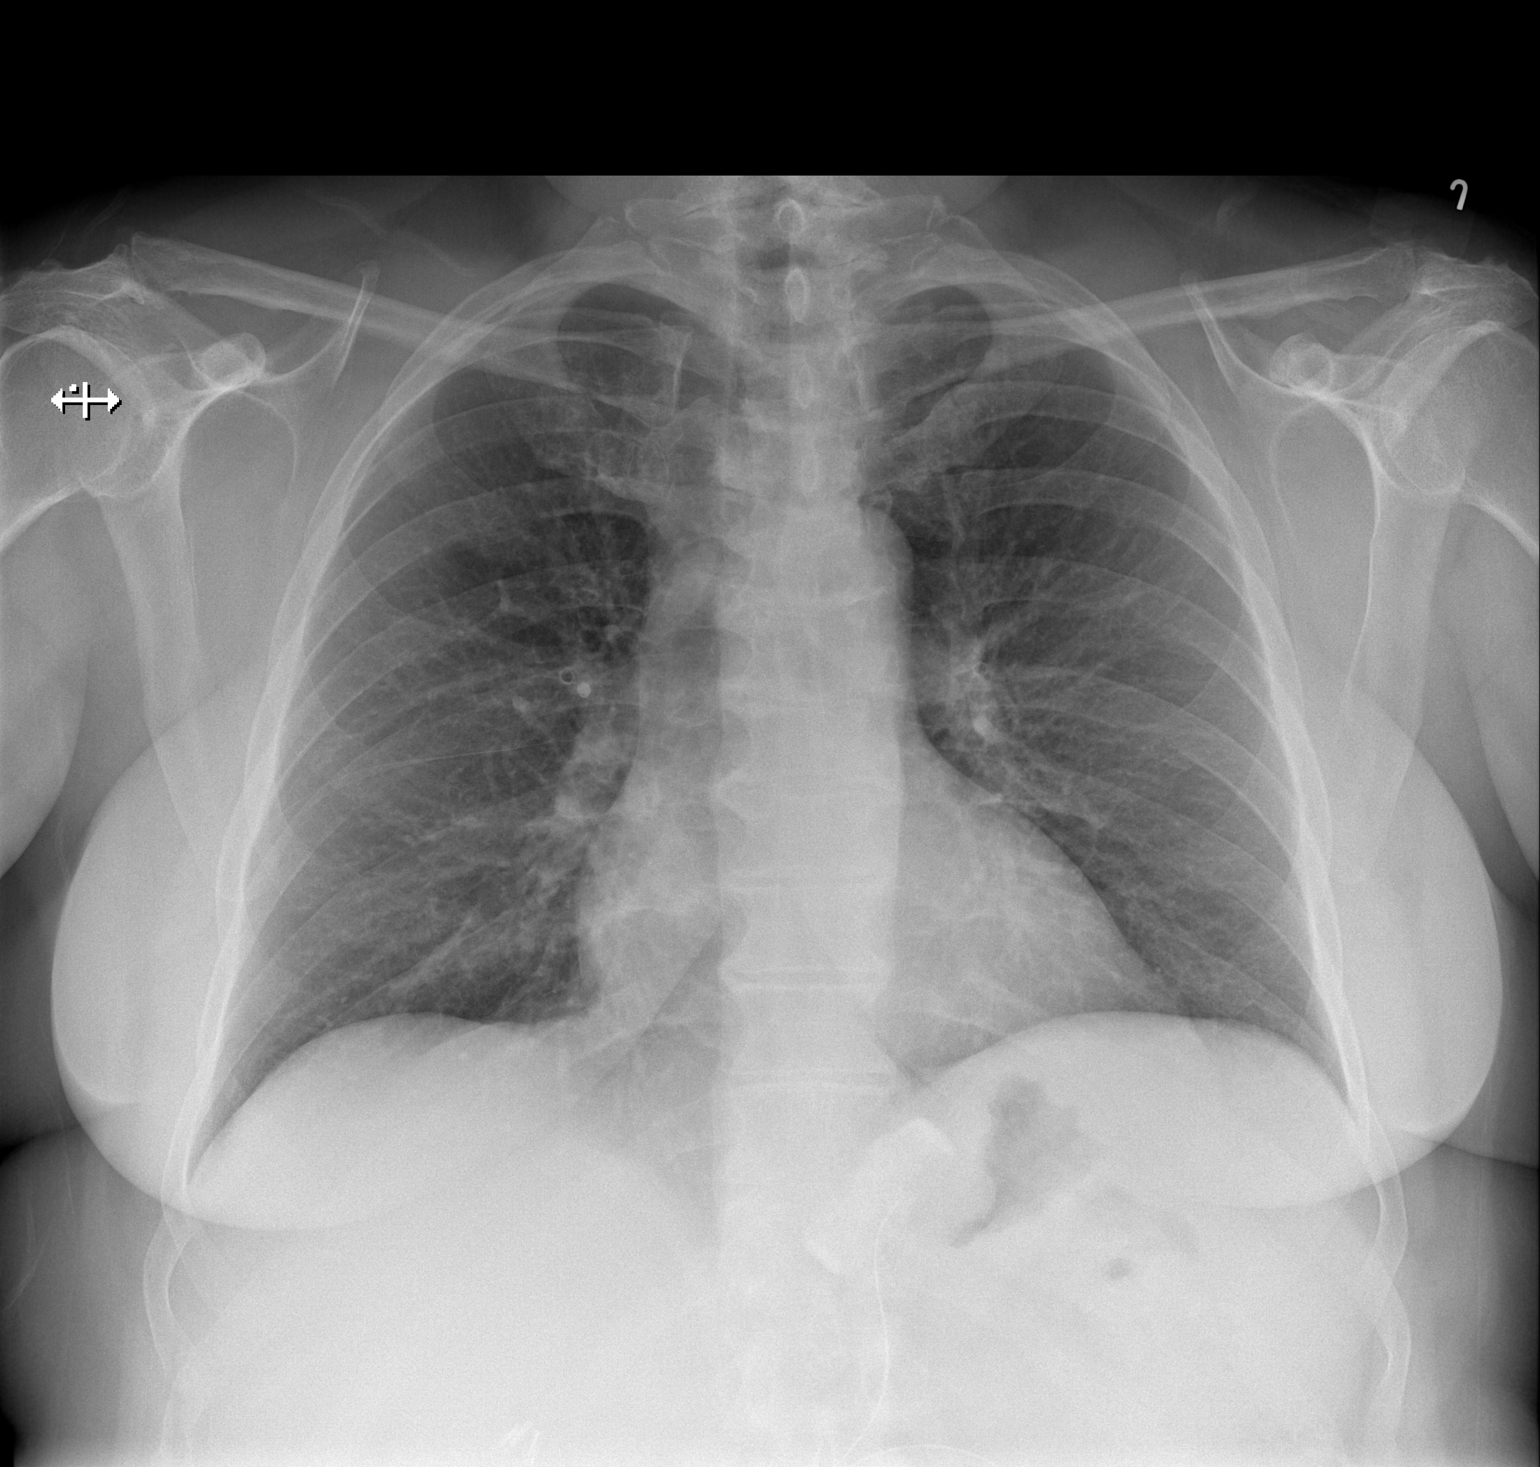

[w chest lat]
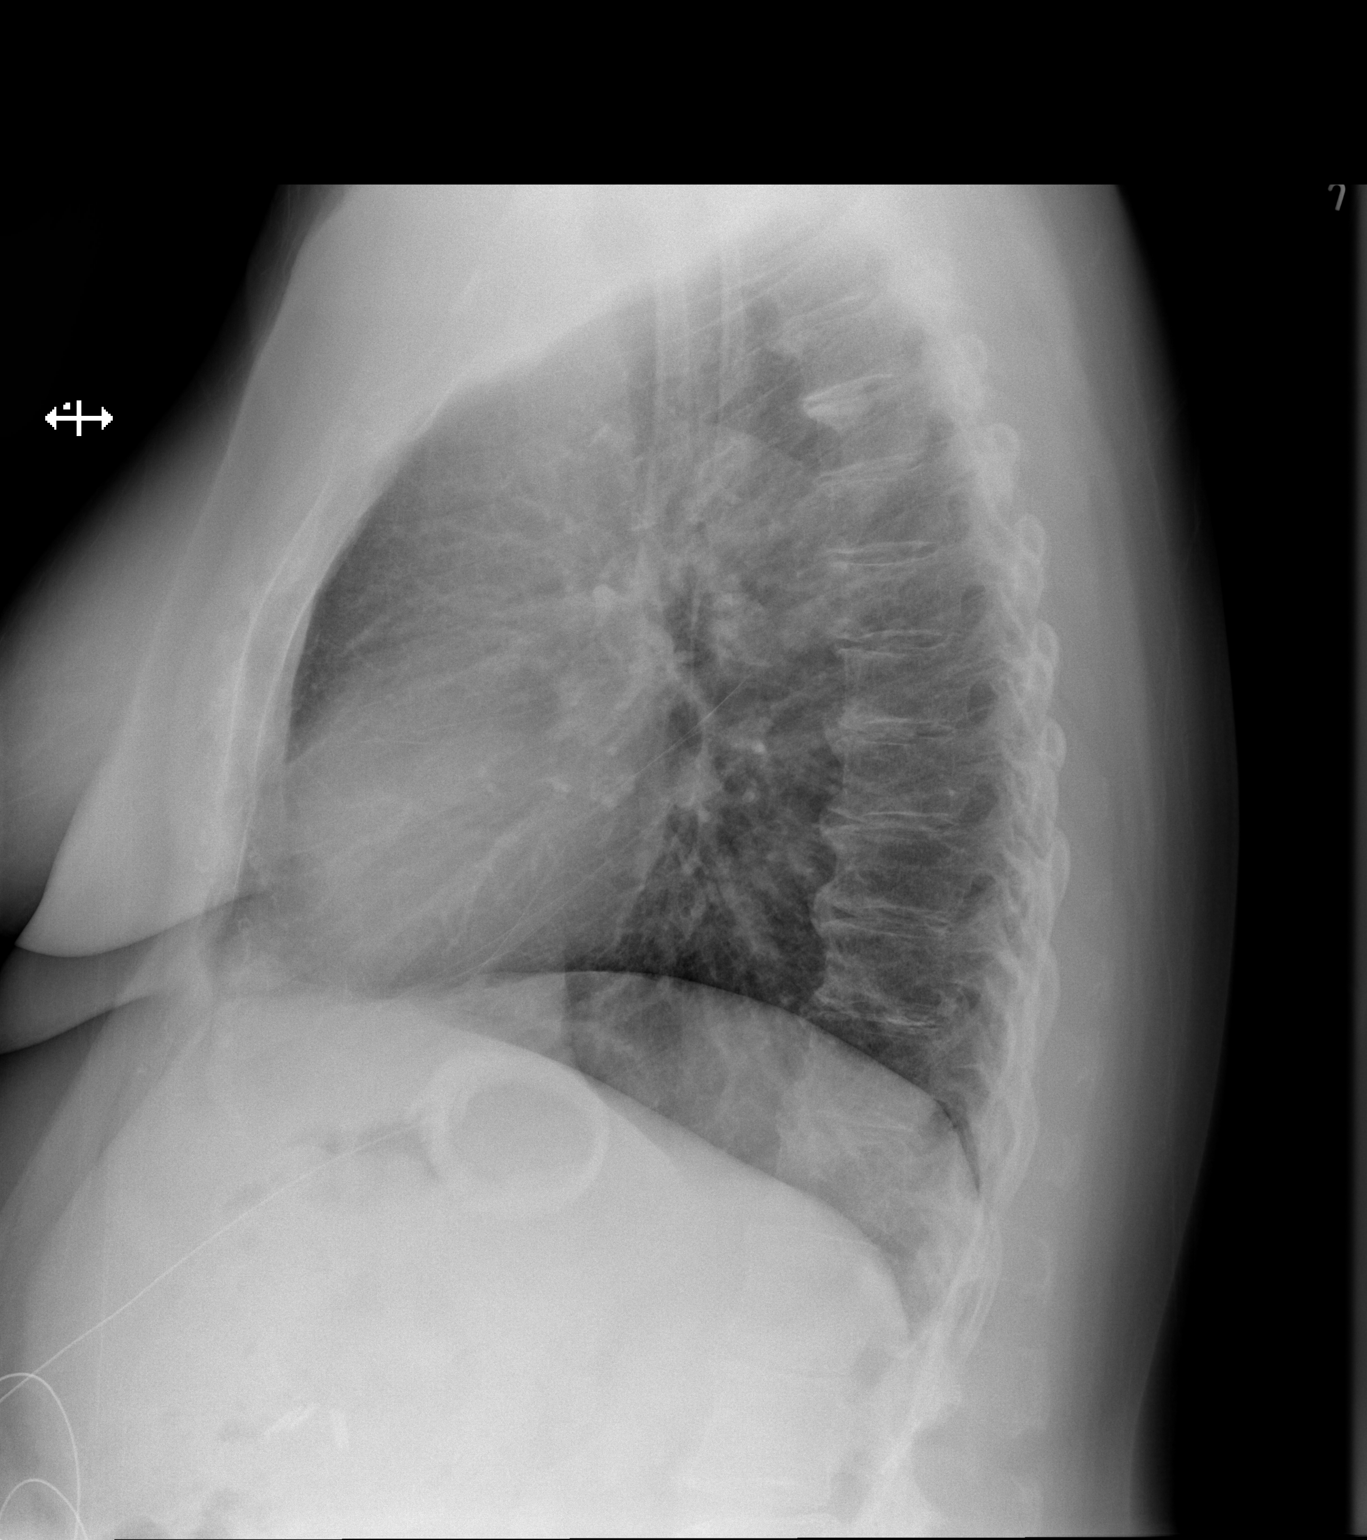

[2 of 2 positions shown; findings below may reference images not displayed]

FINDINGS: The lungs are well-expanded and clear. The heart and pulmonary
vascularity are normal. The mediastinum is normal in width. There is
multilevel degenerative disc disease of the thoracic spine. There is
an adjustable lap band device in place.

There is aortic arch calcification
IMPRESSION: There is no active cardiopulmonary disease.

Aortic atherosclerosis.

## 2016-11-20 ENCOUNTER — Other Ambulatory Visit: Payer: Self-pay | Admitting: Family Medicine

## 2016-11-22 ENCOUNTER — Ambulatory Visit: Payer: BC Managed Care – PPO | Admitting: Family Medicine

## 2016-11-22 DIAGNOSIS — R197 Diarrhea, unspecified: Secondary | ICD-10-CM | POA: Insufficient documentation

## 2016-11-22 DIAGNOSIS — K219 Gastro-esophageal reflux disease without esophagitis: Secondary | ICD-10-CM | POA: Insufficient documentation

## 2016-11-25 ENCOUNTER — Ambulatory Visit (INDEPENDENT_AMBULATORY_CARE_PROVIDER_SITE_OTHER): Payer: BC Managed Care – PPO | Admitting: Family Medicine

## 2016-11-25 ENCOUNTER — Encounter: Payer: Self-pay | Admitting: Family Medicine

## 2016-11-25 VITALS — BP 123/70 | HR 76 | Ht 63.0 in | Wt 188.0 lb

## 2016-11-25 DIAGNOSIS — E1165 Type 2 diabetes mellitus with hyperglycemia: Secondary | ICD-10-CM

## 2016-11-25 DIAGNOSIS — I700 Atherosclerosis of aorta: Secondary | ICD-10-CM | POA: Diagnosis not present

## 2016-11-25 DIAGNOSIS — I10 Essential (primary) hypertension: Secondary | ICD-10-CM | POA: Diagnosis not present

## 2016-11-25 DIAGNOSIS — C779 Secondary and unspecified malignant neoplasm of lymph node, unspecified: Secondary | ICD-10-CM | POA: Diagnosis not present

## 2016-11-25 DIAGNOSIS — IMO0001 Reserved for inherently not codable concepts without codable children: Secondary | ICD-10-CM

## 2016-11-25 DIAGNOSIS — R911 Solitary pulmonary nodule: Secondary | ICD-10-CM

## 2016-11-25 DIAGNOSIS — C439 Malignant melanoma of skin, unspecified: Secondary | ICD-10-CM | POA: Diagnosis not present

## 2016-11-25 DIAGNOSIS — C4362 Malignant melanoma of left upper limb, including shoulder: Secondary | ICD-10-CM | POA: Insufficient documentation

## 2016-11-25 LAB — POCT GLYCOSYLATED HEMOGLOBIN (HGB A1C): HEMOGLOBIN A1C: 7.2

## 2016-11-25 NOTE — Progress Notes (Signed)
Subjective:    CC: DM, HTN  HPI:  Diabetes - no hypoglycemic events. No wounds or sores that are not healing well. No increased thirst or urination. Checking glucose at home. Taking medications as prescribed without any side effects.  Lab Results  Component Value Date   HGBA1C 7.5 08/22/2016     Hypertension- Pt denies chest pain, SOB, dizziness, or heart palpitations.  Taking meds as directed w/o problems.  Denies medication side effects.    Melanoma, metastatic-and unfortunately she was amenable to go her last chemotherapy treatment.  She had such significant nausea vomiting and diarrhea.  So she has not had any treatment for the last 4 weeks but is following up soon.  She also has an appointment Dr. Erlene Quan coming up.  They just did a CAT scan about a week ago and she had a colonoscopy.   Past medical history, Surgical history, Family history not pertinant except as noted below, Social history, Allergies, and medications have been entered into the medical record, reviewed, and corrections made.   Review of Systems: No fevers, chills, night sweats, weight loss, chest pain, or shortness of breath.   Objective:    General: Well Developed, well nourished, and in no acute distress.  Neuro: Alert and oriented x3, extra-ocular muscles intact, sensation grossly intact.  HEENT: Normocephalic, atraumatic  Skin: Warm and dry, no rashes. Cardiac: Regular rate and rhythm, no murmurs rubs or gallops, no lower extremity edema.  Respiratory: Clear to auscultation bilaterally. Not using accessory muscles, speaking in full sentences.   Impression and Recommendations:    DM-still not at goal but improved.  Hemoglobin A1c down to 7.2 today which is great progress.  She is really been craving a lot of fruit but explained that that can increase the sugar levels very quickly.  We will continue to work on healthy diet and staying active and recheck in 3 months.  HTN - Well controlled. Continue current  regimen. Follow up in  3-4 months.   Aortic atherosclerosis-seen on recent CT that she had performed.  Pulmonary nodule left lower lobe measuring approximately 10 mm-recommending close interval follow-up of the nodule. This will be followed by oncology.    Flu vac declined. Her oncologist asked her to get it there yet.

## 2016-12-05 ENCOUNTER — Other Ambulatory Visit: Payer: Self-pay

## 2016-12-05 ENCOUNTER — Other Ambulatory Visit: Payer: Self-pay | Admitting: Family Medicine

## 2016-12-05 MED ORDER — DAPAGLIFLOZIN PROPANEDIOL 10 MG PO TABS: 10.0000 mg | ORAL_TABLET | Freq: Every day | ORAL | 1 refills | Status: DC

## 2016-12-27 ENCOUNTER — Other Ambulatory Visit: Payer: Self-pay | Admitting: Family Medicine

## 2017-02-12 ENCOUNTER — Telehealth: Payer: Self-pay | Admitting: *Deleted

## 2017-02-12 NOTE — Telephone Encounter (Signed)
lovaza approved from 02/11/17-02/12/2020. Pharmacy notified

## 2017-02-19 ENCOUNTER — Encounter: Payer: BC Managed Care – PPO | Admitting: Gynecology

## 2017-03-04 ENCOUNTER — Encounter: Payer: Self-pay | Admitting: Family Medicine

## 2017-03-04 ENCOUNTER — Ambulatory Visit: Payer: BC Managed Care – PPO | Admitting: Family Medicine

## 2017-03-04 VITALS — BP 131/60 | HR 76 | Wt 189.0 lb

## 2017-03-04 DIAGNOSIS — E039 Hypothyroidism, unspecified: Secondary | ICD-10-CM | POA: Diagnosis not present

## 2017-03-04 DIAGNOSIS — C439 Malignant melanoma of skin, unspecified: Secondary | ICD-10-CM | POA: Diagnosis not present

## 2017-03-04 DIAGNOSIS — R748 Abnormal levels of other serum enzymes: Secondary | ICD-10-CM

## 2017-03-04 DIAGNOSIS — E1165 Type 2 diabetes mellitus with hyperglycemia: Secondary | ICD-10-CM

## 2017-03-04 DIAGNOSIS — E876 Hypokalemia: Secondary | ICD-10-CM | POA: Diagnosis not present

## 2017-03-04 DIAGNOSIS — IMO0001 Reserved for inherently not codable concepts without codable children: Secondary | ICD-10-CM

## 2017-03-04 DIAGNOSIS — C779 Secondary and unspecified malignant neoplasm of lymph node, unspecified: Secondary | ICD-10-CM

## 2017-03-04 DIAGNOSIS — I10 Essential (primary) hypertension: Secondary | ICD-10-CM | POA: Diagnosis not present

## 2017-03-04 LAB — POCT GLYCOSYLATED HEMOGLOBIN (HGB A1C): HEMOGLOBIN A1C: 7

## 2017-03-04 MED ORDER — LOSARTAN POTASSIUM-HCTZ 100-25 MG PO TABS: 1.0000 | ORAL_TABLET | Freq: Every day | ORAL | 1 refills | Status: DC

## 2017-03-04 NOTE — Progress Notes (Signed)
Subjective:    CC: Diabetes and blood pressure.  HPI:  Diabetes - no hypoglycemic events. No wounds or sores that are not healing well. No increased thirst or urination. Checking glucose at home. Taking medications as prescribed without any side effects.  Currently taking metformin, Farxiga, glipizide and Trulicity.  Hypertension- Pt denies chest pain, SOB, dizziness, or heart palpitations.  Taking meds as directed w/o problems.  Denies medication side effects.    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  She still feels a little tired.  Her plan is when she finishes up her treatments in July that she will probably go back to work in mid to late August.  She has been having problems with low potassium.  They kept upping her potassium to the point that she was having severe diarrhea.  She eventually just stopped it and try to work really hard on getting potassium in her diet.   Past medical history, Surgical history, Family history not pertinant except as noted below, Social history, Allergies, and medications have been entered into the medical record, reviewed, and corrections made.   Review of Systems: No fevers, chills, night sweats, weight loss, chest pain, or shortness of breath.   Objective:    General: Well Developed, well nourished, and in no acute distress.  Neuro: Alert and oriented x3, extra-ocular muscles intact, sensation grossly intact.  HEENT: Normocephalic, atraumatic  Skin: Warm and dry, no rashes. Cardiac: Regular rate and rhythm, no murmurs rubs or gallops, no lower extremity edema.  Respiratory: Clear to auscultation bilaterally. Not using accessory muscles, speaking in full sentences.   Impression and Recommendations:    HTN -well controlled.  Continue current regimen.  Follow-up in 6 months.  Lab slip printed and she can have that hopefully drawn from her port in about 2 weeks  when she goes for her regular labs.  DM - A1C is 7.0.  Foot exam perfromed today.  Reminded her to schedule an eye exam.   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  Should hopefully finished in July.  Beta liver enzymes-due to repeat liver enzymes.  Plan to continue to monitor those every 6 months.  Diarrhea seems to have resolved since she came off the potassium tabs and is just trying to get extra potassium through her diet.  I gave her handout that we had for dietary supplementation from foods for potassium.  Hypokalemia-see note above.

## 2017-03-04 NOTE — Patient Instructions (Addendum)

## 2017-03-05 ENCOUNTER — Encounter: Payer: Self-pay | Admitting: Family Medicine

## 2017-03-05 ENCOUNTER — Telehealth: Payer: Self-pay

## 2017-03-05 NOTE — Telephone Encounter (Signed)
Pamela Holloway called and left a message stating she is taking, Farxiga, Trulicity, Glipizide and Metformin as directed.

## 2017-03-06 NOTE — Telephone Encounter (Signed)
Patient advised of recommendations.  

## 2017-03-06 NOTE — Telephone Encounter (Signed)
Perfect.  Just continue with current regimen and continue to work on increasing activity and diet.

## 2017-03-18 LAB — BASIC METABOLIC PANEL
BUN: 15 (ref 4–21)
CREATININE: 0.8 (ref 0.5–1.1)
Glucose: 145
Potassium: 3.9 (ref 3.4–5.3)
Sodium: 139 (ref 137–147)

## 2017-03-18 LAB — HEPATIC FUNCTION PANEL
ALT: 58 — AB (ref 7–35)
AST: 43 — AB (ref 13–35)
Bilirubin, Total: 1.3

## 2017-03-18 LAB — CBC AND DIFFERENTIAL
HEMATOCRIT: 45 (ref 36–46)
HEMOGLOBIN: 5.3 — AB (ref 12.0–16.0)
PLATELETS: 208 (ref 150–399)
WBC: 6

## 2017-03-26 ENCOUNTER — Other Ambulatory Visit: Payer: Self-pay | Admitting: Family Medicine

## 2017-04-01 ENCOUNTER — Encounter: Payer: Self-pay | Admitting: Gynecology

## 2017-04-01 ENCOUNTER — Ambulatory Visit: Payer: BC Managed Care – PPO | Admitting: Gynecology

## 2017-04-01 VITALS — BP 122/78 | Ht 64.0 in | Wt 191.0 lb

## 2017-04-01 DIAGNOSIS — N952 Postmenopausal atrophic vaginitis: Secondary | ICD-10-CM | POA: Diagnosis not present

## 2017-04-01 DIAGNOSIS — Z01411 Encounter for gynecological examination (general) (routine) with abnormal findings: Secondary | ICD-10-CM | POA: Diagnosis not present

## 2017-04-01 NOTE — Patient Instructions (Signed)
Follow-up in 1 year for annual exam, sooner as needed. 

## 2017-04-01 NOTE — Addendum Note (Signed)
Addended by: Nelva Nay on: 04/01/2017 04:25 PM   Modules accepted: Orders

## 2017-04-01 NOTE — Progress Notes (Signed)
    Pamela Holloway Jun 21, 1960 [id number]        57 y.o.  [id number] for annual gynecologic exam.  Doing well from a gynecologic standpoint.  Currently being treated for stage III melanoma with chemotherapy.  Reports doing well with this.  Past medical history,surgical history, problem list, medications, allergies, family history and social history were all reviewed and documented as reviewed in the EPIC chart.  ROS:  Performed with pertinent positives and negatives included in the history, assessment and plan.   Additional significant findings : None   Exam: [name] assistant Vitals:   04/01/17 1528  BP: 122/78  Weight: 191 lb (86.6 kg)  Height: 5\' 4"  (1.626 m)   Body mass index is 32.79 kg/m.  General appearance:  Normal affect, orientation and appearance. Skin: Grossly normal HEENT: Without gross lesions.  No cervical or supraclavicular adenopathy. Thyroid normal.  Lungs:  Clear without wheezing, rales or rhonchi Cardiac: RR, without RMG Abdominal:  Soft, nontender, without masses, guarding, rebound, organomegaly or hernia Breasts:  Examined lying and sitting without masses, retractions, discharge or axillary adenopathy. Pelvic:  Ext, BUS, Vagina: With atrophic changes.  With right labial hypertrophy as always.  Cervix: Normal.  Pap smear done  Uterus: Anteverted, normal size, shape and contour, midline and mobile nontender   Adnexa: Without masses or tenderness    Anus and perineum: Normal   Rectovaginal: Normal sphincter tone without palpated masses or tenderness.    Assessment/Plan:  57 y.o. [id number] female for annual gynecologic exam.   1. Postmenopausal/atrophic genital changes.  No significant hot flushes, night sweats, vaginal dryness or any bleeding.  Report any issues or bleeding. 2. History of intermittent vulvar itching.  Uses Mytrex cream as needed.  Has supply but will call when needs more. 3. Pap smear 2018.  Pap smear done today as she is on chemotherapy and  we will continue with annual cytology as long as she is on chemotherapy.  No history of abnormal Pap smears previously. 4. Mammography 04/2016.  Continue with annual mammography when due next month.  Breast exam normal today. 5. Colonoscopy 2017.  Repeat at their recommended interval. 6. Health maintenance.  No routine lab work done as patient reports this done elsewhere.  Follow-up 1 year, sooner as needed.   [name], 3:54 PM 04/01/2017

## 2017-04-03 LAB — PAP IG W/ RFLX HPV ASCU

## 2017-04-07 ENCOUNTER — Encounter: Payer: Self-pay | Admitting: Family Medicine

## 2017-04-15 ENCOUNTER — Encounter: Payer: Self-pay | Admitting: Family Medicine

## 2017-04-15 ENCOUNTER — Ambulatory Visit: Payer: BC Managed Care – PPO | Admitting: Family Medicine

## 2017-04-15 VITALS — BP 140/64 | HR 81 | Temp 98.2°F | Wt 192.0 lb

## 2017-04-15 DIAGNOSIS — R21 Rash and other nonspecific skin eruption: Secondary | ICD-10-CM | POA: Diagnosis not present

## 2017-04-15 DIAGNOSIS — C439 Malignant melanoma of skin, unspecified: Secondary | ICD-10-CM | POA: Diagnosis not present

## 2017-04-15 DIAGNOSIS — C779 Secondary and unspecified malignant neoplasm of lymph node, unspecified: Secondary | ICD-10-CM

## 2017-04-15 DIAGNOSIS — R5383 Other fatigue: Secondary | ICD-10-CM

## 2017-04-15 MED ORDER — NYSTATIN-TRIAMCINOLONE 100000-0.1 UNIT/GM-% EX OINT: 1.0000 "application " | TOPICAL_OINTMENT | Freq: Two times a day (BID) | CUTANEOUS | 1 refills | Status: DC

## 2017-04-15 NOTE — Progress Notes (Signed)
   Subjective:    Patient ID: Pamela Holloway, female    DOB: [birth year], 57 y.o.   MRN: [id number]  HPI 57 year old female, who is here with her husband today,  who is currently on chemotherapy for metastatic melanoma comes in today to discuss whether or not she should consider getting on disability through [employer].  Even before her diagnosis of melanoma she was struggling with excess fatigue from her thyroid disorder.  She also has type 2 diabetes that has not been the best controlled until more recently.  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  She says some days it was her struggle to get through her workday.  Now that she has metastatic melanoma and has been getting chemotherapy treatments she has had extreme fatigue and has been I am unable to work.  She also has persistent diarrhea that has been quite problematic as well.  She will finish her chemotherapy treatment the last week of June and her initial plan was to try to return to work by 24 August.   Would also like a refill on her nystatin cream that she uses as needed underneath her breasts.    Review of Systems     Objective:   Physical Exam  Constitutional: She is oriented to person, place, and time. She appears well-developed and well-nourished.  HENT:  Head: Normocephalic and atraumatic.  Eyes: Conjunctivae and EOM are normal.  Cardiovascular: Normal rate.  Pulmonary/Chest: Effort normal.  Neurological: She is alert and oriented to person, place, and time.  Skin: Skin is dry. No pallor.  Psychiatric: She has a normal mood and affect. Her behavior is normal.  Vitals reviewed.         Assessment & Plan:  Metastatic melanoma-at this point I think she is disabled.  But I am not sure how she will do after her treatments and how well she will recover.  At this point I would encourage her to at least attempt to go back to work in August and  see how well she does.  If she still struggling with extreme fatigue and symptoms then it may be worth applying for long-term disability.  Explained how the process works  Fatigue - see note above.    Rash -refilled topical cream.  Time spent 25 min than 50% time spent counseling about her cancer, fatigue and rash.

## 2017-05-10 ENCOUNTER — Other Ambulatory Visit: Payer: Self-pay | Admitting: Family Medicine

## 2017-06-24 ENCOUNTER — Ambulatory Visit: Payer: BC Managed Care – PPO | Admitting: Family Medicine

## 2017-06-25 ENCOUNTER — Other Ambulatory Visit: Payer: Self-pay | Admitting: Family Medicine

## 2017-07-08 ENCOUNTER — Other Ambulatory Visit: Payer: Self-pay | Admitting: Family Medicine

## 2017-07-08 ENCOUNTER — Ambulatory Visit: Payer: BC Managed Care – PPO | Admitting: Family Medicine

## 2017-07-08 ENCOUNTER — Other Ambulatory Visit: Payer: Self-pay | Admitting: *Deleted

## 2017-07-15 ENCOUNTER — Other Ambulatory Visit: Payer: Self-pay | Admitting: *Deleted

## 2017-07-15 DIAGNOSIS — E1165 Type 2 diabetes mellitus with hyperglycemia: Principal | ICD-10-CM

## 2017-07-15 DIAGNOSIS — IMO0001 Reserved for inherently not codable concepts without codable children: Secondary | ICD-10-CM

## 2017-07-15 MED ORDER — DAPAGLIFLOZIN PROPANEDIOL 10 MG PO TABS: 10.0000 mg | ORAL_TABLET | Freq: Every day | ORAL | 1 refills | Status: DC

## 2017-07-18 DIAGNOSIS — R1310 Dysphagia, unspecified: Secondary | ICD-10-CM | POA: Insufficient documentation

## 2017-07-18 DIAGNOSIS — B1920 Unspecified viral hepatitis C without hepatic coma: Secondary | ICD-10-CM | POA: Insufficient documentation

## 2017-07-24 ENCOUNTER — Telehealth: Payer: Self-pay | Admitting: Family Medicine

## 2017-07-24 DIAGNOSIS — IMO0001 Reserved for inherently not codable concepts without codable children: Secondary | ICD-10-CM

## 2017-07-24 DIAGNOSIS — I10 Essential (primary) hypertension: Secondary | ICD-10-CM

## 2017-07-24 DIAGNOSIS — R748 Abnormal levels of other serum enzymes: Secondary | ICD-10-CM

## 2017-07-24 DIAGNOSIS — E039 Hypothyroidism, unspecified: Secondary | ICD-10-CM

## 2017-07-24 DIAGNOSIS — R21 Rash and other nonspecific skin eruption: Secondary | ICD-10-CM

## 2017-07-24 DIAGNOSIS — E1165 Type 2 diabetes mellitus with hyperglycemia: Secondary | ICD-10-CM

## 2017-07-24 DIAGNOSIS — E876 Hypokalemia: Secondary | ICD-10-CM

## 2017-07-24 NOTE — Telephone Encounter (Signed)
CMP, lipid, and A!C

## 2017-07-24 NOTE — Telephone Encounter (Signed)
Labs ordered. Left VM for Pt with status update.

## 2017-07-24 NOTE — Telephone Encounter (Signed)
Pt called clinic to see if she was due for any repeat lab work. States she last time she needed labs, the order were faxed to her oncologist and they were drawn from her port. Her last chemo treatment is scheduled for 08/08/17, then her port will be removed shortly after. If she needs labs, she would like to have them done prior to port removal.   Will route.

## 2017-07-29 ENCOUNTER — Ambulatory Visit: Payer: BC Managed Care – PPO | Admitting: Family Medicine

## 2017-08-08 LAB — BASIC METABOLIC PANEL
BUN: 19 (ref 4–21)
Creatinine: 0.9 (ref 0.5–1.1)
GLUCOSE: 320
Potassium: 3.1 — AB (ref 3.4–5.3)
SODIUM: 137 (ref 137–147)

## 2017-08-08 LAB — HEPATIC FUNCTION PANEL
ALK PHOS: 89 (ref 25–125)
ALT: 45 — AB (ref 7–35)
AST: 27 (ref 13–35)
BILIRUBIN, TOTAL: 1.7

## 2017-08-08 LAB — CBC AND DIFFERENTIAL
HEMATOCRIT: 43 (ref 36–46)
Hemoglobin: 14.9 (ref 12.0–16.0)
Neutrophils Absolute: 4
PLATELETS: 218 (ref 150–399)
WBC: 6.4

## 2017-08-08 LAB — LIPID PANEL
Cholesterol: 185 (ref 0–200)
HDL: 31 — AB (ref 35–70)
Triglycerides: 749 — AB (ref 40–160)

## 2017-08-17 ENCOUNTER — Other Ambulatory Visit: Payer: Self-pay | Admitting: Family Medicine

## 2017-08-19 ENCOUNTER — Ambulatory Visit (INDEPENDENT_AMBULATORY_CARE_PROVIDER_SITE_OTHER): Payer: BC Managed Care – PPO | Admitting: Family Medicine

## 2017-08-19 ENCOUNTER — Encounter: Payer: Self-pay | Admitting: Family Medicine

## 2017-08-19 VITALS — BP 131/67 | HR 93 | Temp 98.0°F | Ht 64.0 in | Wt 178.5 lb

## 2017-08-19 DIAGNOSIS — Z91030 Bee allergy status: Secondary | ICD-10-CM | POA: Diagnosis not present

## 2017-08-19 DIAGNOSIS — E1165 Type 2 diabetes mellitus with hyperglycemia: Secondary | ICD-10-CM

## 2017-08-19 DIAGNOSIS — C4362 Malignant melanoma of left upper limb, including shoulder: Secondary | ICD-10-CM | POA: Diagnosis not present

## 2017-08-19 DIAGNOSIS — I10 Essential (primary) hypertension: Secondary | ICD-10-CM

## 2017-08-19 DIAGNOSIS — IMO0001 Reserved for inherently not codable concepts without codable children: Secondary | ICD-10-CM

## 2017-08-19 LAB — POCT GLYCOSYLATED HEMOGLOBIN (HGB A1C): Hemoglobin A1C: 9.1 % — AB (ref 4.0–5.6)

## 2017-08-19 MED ORDER — EPINEPHRINE 0.3 MG/0.3ML IJ SOAJ: 0.3000 mg | Freq: Once | INTRAMUSCULAR | 1 refills | Status: AC

## 2017-08-19 MED ORDER — INSULIN DEGLUDEC 100 UNIT/ML ~~LOC~~ SOPN: 20.0000 [IU] | PEN_INJECTOR | Freq: Every day | SUBCUTANEOUS | 0 refills | Status: DC

## 2017-08-19 NOTE — Patient Instructions (Signed)
Start with Pamela Holloway 10 units daily x 2 days.  Then increase by 2 units every other day until your blood sugar is less than 130. Once you get under 130 then stay at same dose of the insulin.

## 2017-08-19 NOTE — Progress Notes (Signed)
Subjective:    CC: DM, HTN  HPI:  Diabetes - no hypoglycemic events. No wounds or sores that are not healing well. No increased thirst or urination. Checking glucose at home. Pt has been having hyperglycemic episodes in the 300s w/ lowest being 207 since being on prednisone over a couple of months. Pt also has been having UTI and failed several antibiotics including Augmentin, nitrofurantoin. Only symptoms being urinary frequency.  In fact her urologist to do was recently consulted has suggested that she might even have interstitial cystitis.  She mentioned she had a urine culture and has been started taking a new antibiotic and is currently being treated for yeast infection.   Hypertension- Pt denies chest pain, SOB, dizziness, or heart palpitations. Taking meds as directed w/o problems.  Denies medication side effects.    F/U melanoma - Pt says she has been cleared. Finished last treatment July 12th. Dr. Humphrey Rolls is the oncologist.  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  After they moved last year she is not sure where her current EpiPen is located.  She also wanted to let me know that she was started on a pancreatic enzyme after she was having chronic diarrhea.  She is only taking 1 capsule a day and says it has completely resolved her diarrhea.  Past medical history, Surgical history, Family history not pertinant except as noted below, Social history, Allergies, and medications have been entered into the medical record, reviewed, and corrections made.   Review of Systems: No fevers, chills, night sweats, weight loss, chest pain, or shortness of breath.   Objective:    General: Well Developed, well nourished, and in no acute distress.  Neuro: Alert and oriented x3, extra-ocular muscles intact, sensation grossly intact.  HEENT: Normocephalic, atraumatic  Skin: Warm and dry, no rashes. Cardiac: Regular rate and rhythm, no murmurs rubs or gallops, no  lower extremity edema.  Respiratory: Clear to auscultation bilaterally. Not using accessory muscles, speaking in full sentences.   Impression and Recommendations:    HTN -controlled.  Continue current regimen.  Labs done recently.  DM -uncontrolled.  Hemoglobin A1c elevated at 9.1.  We discussed options.  Right now she is already on triple therapy with Trulicity, metformin and [place].  Discussed the importance of starting insulin at least temporarily to help get her blood sugars under quick control.  Given a sample pen of [place].  Start with 10 units nightly and increase by 2 units every other day until fasting blood sugars are under 130.  When she gets to that point and she will stay on that current dose until I see her back in 3 months.  If at any point she is having any problems or concerns or side effects or hypoglycemia please give me a call.  She is now off of prednisone so this will make it easier to control her blood sugars and just encouraged her to continue to work on her diet and exercise.  Recurrent urinary tract infections-likely secondary to uncontrolled diabetes as well as [place].  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

## 2017-08-23 ENCOUNTER — Other Ambulatory Visit: Payer: Self-pay | Admitting: Family Medicine

## 2017-08-26 ENCOUNTER — Other Ambulatory Visit: Payer: Self-pay

## 2017-08-26 MED ORDER — PEN NEEDLES 32G X 4 MM MISC: 1.0000 | Freq: Every day | 3 refills | Status: DC

## 2017-08-26 NOTE — Telephone Encounter (Signed)
Pt recently received RX for [place] but did not receive any pen needles. Pt requesting an RX for this be [facility] to [facility] [place].  RX sent, pt advised

## 2017-09-03 ENCOUNTER — Encounter: Payer: Self-pay | Admitting: Family Medicine

## 2017-09-03 ENCOUNTER — Telehealth: Payer: Self-pay

## 2017-09-03 DIAGNOSIS — Z0184 Encounter for antibody response examination: Secondary | ICD-10-CM

## 2017-09-03 NOTE — Telephone Encounter (Signed)
Pamela Holloway called and states to give the information for the MMR titer and PPD.   Fax # [phone] Phone # [phone]

## 2017-09-03 NOTE — Telephone Encounter (Signed)
Order faxed and confirmation received.Pamela Holloway, Pamela Holloway

## 2017-09-03 NOTE — Telephone Encounter (Signed)
Spoke w/pt and informed her that I called the # that she left and it wouldn't let me speak to someone directly and I did not want to leave a VM regarding this. She also tried calling and got the same vm. She asked that I fax over the order for the lab work and stated that they will take care of drawing the labs.   Order for MMR, and TB screening ordered and faxed.Elouise Munroe, [place]

## 2017-09-05 ENCOUNTER — Telehealth: Payer: Self-pay

## 2017-09-05 NOTE — Telephone Encounter (Signed)
Pt called to make provider aware she had her blood work done today. She was told results will be available on Tuesday and labs will be sent to Dr Madilyn Fireman.   Pt wanting to know if her results can be faxed or if she needs to come pick them up.  Advised pt that we can fax results to [facility]. She states to fax to same fax number that previous documents were sent to.   FYI to [facility] and Dr Madilyn Fireman

## 2017-09-08 ENCOUNTER — Telehealth: Payer: Self-pay | Admitting: *Deleted

## 2017-09-08 LAB — QUANTIFERON-TB GOLD PLUS,4 TUBES,DRAW SITE INCUBATED
MITOGEN-NIL: 2.39 [IU]/mL
NIL: 0.01 IU/mL
QuantiFERON-TB Gold Plus,4 T,Incubated: NEGATIVE
TB1-NIL: 0.01 [IU]/mL
TB2-NIL: 0 [IU]/mL

## 2017-09-08 LAB — MEASLES/MUMPS/RUBELLA IMMUNITY: Rubella: 2.14 index

## 2017-09-08 LAB — TEST AUTHORIZATION

## 2017-09-08 LAB — TIQ-NTM

## 2017-09-08 NOTE — Telephone Encounter (Signed)
Test results faxed to pt's job, confirmation received.Elouise Munroe, [place]

## 2017-09-08 NOTE — Telephone Encounter (Signed)
Called to get permission to switch code for quantiferon TB test because 4 tubes were drawn and the test code will also need to be changed to 36971. I gave ok to change this.Pamela Holloway, [place]

## 2017-09-09 NOTE — Telephone Encounter (Signed)
Test results faxed,confirmation received.Pamela Holloway, [place]

## 2017-09-12 DIAGNOSIS — R350 Frequency of micturition: Secondary | ICD-10-CM | POA: Insufficient documentation

## 2017-09-22 ENCOUNTER — Other Ambulatory Visit: Payer: Self-pay

## 2017-09-22 MED ORDER — INSULIN DEGLUDEC 100 UNIT/ML ~~LOC~~ SOPN: 20.0000 [IU] | PEN_INJECTOR | Freq: Every day | SUBCUTANEOUS | 1 refills | Status: DC

## 2017-09-22 NOTE — Telephone Encounter (Signed)
No longer using mail order for this mediation.  Pt advised RX sent.

## 2017-09-26 ENCOUNTER — Other Ambulatory Visit: Payer: Self-pay | Admitting: *Deleted

## 2017-10-13 ENCOUNTER — Other Ambulatory Visit: Payer: Self-pay | Admitting: Family Medicine

## 2017-10-21 ENCOUNTER — Encounter: Payer: Self-pay | Admitting: Gynecology

## 2017-10-21 ENCOUNTER — Ambulatory Visit: Payer: BC Managed Care – PPO | Admitting: Gynecology

## 2017-10-21 VITALS — BP 130/78

## 2017-10-21 DIAGNOSIS — N3000 Acute cystitis without hematuria: Secondary | ICD-10-CM

## 2017-10-21 DIAGNOSIS — B373 Candidiasis of vulva and vagina: Secondary | ICD-10-CM

## 2017-10-21 DIAGNOSIS — B3731 Acute candidiasis of vulva and vagina: Secondary | ICD-10-CM

## 2017-10-21 LAB — WET PREP FOR TRICH, YEAST, CLUE

## 2017-10-21 MED ORDER — FLUCONAZOLE 150 MG PO TABS: 150.0000 mg | ORAL_TABLET | ORAL | 1 refills | Status: DC

## 2017-10-21 MED ORDER — CIPROFLOXACIN HCL 250 MG PO TABS: 250.0000 mg | ORAL_TABLET | Freq: Two times a day (BID) | ORAL | 0 refills | Status: DC

## 2017-10-21 MED ORDER — TERCONAZOLE 0.4 % VA CREA: 1.0000 | TOPICAL_CREAM | Freq: Every day | VAGINAL | 0 refills | Status: DC

## 2017-10-21 NOTE — Addendum Note (Signed)
Addended by: Nelva Nay on: 10/21/2017 02:49 PM   Modules accepted: Orders

## 2017-10-21 NOTE — Progress Notes (Signed)
    Pamela Holloway 31-May-1960 [id number]        57 y.o.  [id number] presents complaining of vulvar irritation over the last several months.  No significant discharge or real itching.  Has used OTC antifungal without success.  Also with urinary frequency and some mild dysuria on and off over the last several months.  No urgency low back pain fever or chills..  Has finished chemotherapy for her melanoma in July.    Past medical history,surgical history, problem list, medications, allergies, family history and social history were all reviewed and documented in the EPIC chart.  Directed ROS with pertinent positives and negatives documented in the history of present illness/assessment and plan.  Exam: [name] assistant Vitals:   10/21/17 1241  BP: 130/78   General appearance:  Normal Abdomen soft nontender without masses guarding rebound Pelvic external BUS vagina with generalized erythema with mild vulvitis.  No specific lesions or excoriations.  No significant discharge.  Cervix normal.  Uterus grossly normal midline mobile nontender.  Adnexa without masses or tenderness.  Assessment/Plan:  57 y.o. [id number] with history as above.  Wet prep returns positive for yeast.  Urine analysis consistent with UTI.  Will cover with Terazol 7-day cream given the failure with OTC products.  Ciprofloxacin 250 mg twice daily x7 days to treat the urinary tract infection.  Black box warning reviewed.  Given the recurrence of her yeast over time going to cover her also with Diflucan 150 mg once weekly as a prophylaxis x2 months to see if this does not prevent recurrences for now.  She is diabetic and hopefully once the weather is cooler she will avoid recurrent yeast infections.  Patient will call if symptoms persist or any other issues.    [name], 12:50 PM 10/21/2017

## 2017-10-21 NOTE — Patient Instructions (Addendum)
Take the ciprofloxacin antibiotic twice daily for 7 days to treat the UTI.  Use the Terazol 7-day cream nightly x1 week to treat the yeast vaginitis  Take the Diflucan pill once a week for 2 months to prevent a recurrence of the yeast infections.  Call if any issues or concerns

## 2017-10-22 ENCOUNTER — Telehealth: Payer: Self-pay

## 2017-10-22 NOTE — Telephone Encounter (Signed)
Pt reports that she is on a taper with her Tyler Aas and is running out of her meds too quickly.   Current RX is written for her to take up to 40 units a day, but she states that starting today she will be up to 70 units a day.   Wanting new RX sent in with higher quantity so she can continue to taper and not run out of medication so quickly.    RX pended, please update sig and quantity if OK .

## 2017-10-23 ENCOUNTER — Telehealth: Payer: Self-pay | Admitting: Family Medicine

## 2017-10-23 ENCOUNTER — Other Ambulatory Visit: Payer: Self-pay | Admitting: Gynecology

## 2017-10-23 DIAGNOSIS — R3129 Other microscopic hematuria: Secondary | ICD-10-CM

## 2017-10-23 LAB — URINE CULTURE
MICRO NUMBER: 91152224
SPECIMEN QUALITY: ADEQUATE

## 2017-10-23 LAB — URINALYSIS, COMPLETE W/RFL CULTURE
BILIRUBIN URINE: NEGATIVE
Hyaline Cast: NONE SEEN /LPF
KETONES UR: NEGATIVE
Nitrites, Initial: NEGATIVE
PROTEIN: NEGATIVE
Specific Gravity, Urine: 1.01 (ref 1.001–1.03)
pH: 5 (ref 5.0–8.0)

## 2017-10-23 LAB — CULTURE INDICATED

## 2017-10-23 MED ORDER — INSULIN DEGLUDEC 100 UNIT/ML ~~LOC~~ SOPN: 20.0000 [IU] | PEN_INJECTOR | Freq: Every day | SUBCUTANEOUS | 1 refills | Status: DC

## 2017-10-23 MED ORDER — INSULIN DEGLUDEC 100 UNIT/ML ~~LOC~~ SOPN: 70.0000 [IU] | PEN_INJECTOR | Freq: Every day | SUBCUTANEOUS | 2 refills | Status: DC

## 2017-10-23 NOTE — Telephone Encounter (Signed)
rx sent to [facility].

## 2017-10-23 NOTE — Telephone Encounter (Signed)
[facility] called and states they received 2 different prescriptions for the Tresiba with 2 different instructions. Please call them back and verify which direction is correct.  Ref# [id number]

## 2017-10-24 MED ORDER — INSULIN DEGLUDEC 100 UNIT/ML ~~LOC~~ SOPN: 70.0000 [IU] | PEN_INJECTOR | Freq: Every day | SUBCUTANEOUS | 2 refills | Status: DC

## 2017-10-24 NOTE — Telephone Encounter (Signed)
Pt advised. Rx sent to local per request, mail order cancelled by Pt.

## 2017-10-24 NOTE — Telephone Encounter (Signed)
Spoke w/Zack the pharmacy tech and advised that pt is now taking 70-80 units instead of 20-40 units. He then transferred me to [facility] the pharmacist and he confirmed the medication change.Elouise Munroe, [place]

## 2017-10-28 ENCOUNTER — Telehealth: Payer: Self-pay

## 2017-10-28 DIAGNOSIS — Z13220 Encounter for screening for lipoid disorders: Secondary | ICD-10-CM

## 2017-10-28 DIAGNOSIS — I10 Essential (primary) hypertension: Secondary | ICD-10-CM

## 2017-10-28 DIAGNOSIS — R748 Abnormal levels of other serum enzymes: Secondary | ICD-10-CM

## 2017-10-28 NOTE — Telephone Encounter (Signed)
Pamela Holloway is having her [place] flushed on October 15th with Dr Laurelyn Sickle office. She states if Dr Madilyn Fireman needs any labs they can draw the labs that day. Please advise.

## 2017-10-29 NOTE — Addendum Note (Signed)
Addended by: Huel Cote on: 10/29/2017 08:44 AM   Modules accepted: Orders

## 2017-10-29 NOTE — Telephone Encounter (Signed)
Lets do CMP and lipid.

## 2017-10-29 NOTE — Telephone Encounter (Signed)
Labs placed and faxed. Pt advised.

## 2017-11-11 ENCOUNTER — Other Ambulatory Visit: Payer: BC Managed Care – PPO

## 2017-11-11 DIAGNOSIS — R3129 Other microscopic hematuria: Secondary | ICD-10-CM

## 2017-11-11 LAB — BASIC METABOLIC PANEL
BUN: 15 (ref 4–21)
Creatinine: 0.7 (ref 0.5–1.1)
Glucose: 102
Potassium: 3.5 (ref 3.4–5.3)
Sodium: 141 (ref 137–147)

## 2017-11-11 LAB — HEPATIC FUNCTION PANEL
ALT: 24 (ref 7–35)
AST: 20 (ref 13–35)
Bilirubin, Total: 0.7

## 2017-11-11 LAB — LIPID PANEL
Cholesterol: 131 (ref 0–200)
HDL: 34 — AB (ref 35–70)
LDL Cholesterol: 47
TRIGLYCERIDES: 251 — AB (ref 40–160)

## 2017-11-11 LAB — CBC AND DIFFERENTIAL
HEMATOCRIT: 45 (ref 36–46)
HEMOGLOBIN: 14.7 (ref 12.0–16.0)
Platelets: 208 (ref 150–399)
WBC: 6.6

## 2017-11-13 LAB — URINE CULTURE
MICRO NUMBER: 91243735
SPECIMEN QUALITY: ADEQUATE

## 2017-11-13 LAB — URINALYSIS, COMPLETE W/RFL CULTURE
BILIRUBIN URINE: NEGATIVE
Bacteria, UA: NONE SEEN /HPF
Hgb urine dipstick: NEGATIVE
Hyaline Cast: NONE SEEN /LPF
Ketones, ur: NEGATIVE
Leukocyte Esterase: NEGATIVE
NITRITES URINE, INITIAL: NEGATIVE
Protein, ur: NEGATIVE
RBC / HPF: NONE SEEN /HPF (ref 0–2)
Specific Gravity, Urine: 1.038 — ABNORMAL HIGH (ref 1.001–1.03)
pH: <=5 (ref 5.0–8.0)

## 2017-11-13 LAB — CULTURE INDICATED

## 2017-11-14 ENCOUNTER — Telehealth: Payer: Self-pay | Admitting: Family Medicine

## 2017-11-14 NOTE — Telephone Encounter (Signed)
Call patient and let her know that I did receive her lab report.  Total cholesterol and LDL look great.  Her glycerides are still up a little bit so just continue to work on healthy diet.  Mostly plants and meat and minimize carbs breads etc.  Also work on regular exercise.  Hemoglobin is normal no sign of anemia.  Electrolytes are normal.  Kidney function looks great.  And liver enzymes are also normal which is great.

## 2017-11-15 ENCOUNTER — Other Ambulatory Visit: Payer: Self-pay | Admitting: Family Medicine

## 2017-11-17 NOTE — Telephone Encounter (Signed)
Pt advised of results, verbalized understanding. No further questions.  

## 2017-11-21 ENCOUNTER — Encounter: Payer: Self-pay | Admitting: Family Medicine

## 2017-11-24 ENCOUNTER — Encounter: Payer: Self-pay | Admitting: Family Medicine

## 2017-11-24 ENCOUNTER — Ambulatory Visit (INDEPENDENT_AMBULATORY_CARE_PROVIDER_SITE_OTHER): Payer: BC Managed Care – PPO | Admitting: Family Medicine

## 2017-11-24 VITALS — BP 135/66 | HR 101 | Ht 64.0 in | Wt 180.0 lb

## 2017-11-24 DIAGNOSIS — IMO0001 Reserved for inherently not codable concepts without codable children: Secondary | ICD-10-CM

## 2017-11-24 DIAGNOSIS — E1165 Type 2 diabetes mellitus with hyperglycemia: Secondary | ICD-10-CM

## 2017-11-24 DIAGNOSIS — B379 Candidiasis, unspecified: Secondary | ICD-10-CM

## 2017-11-24 DIAGNOSIS — I10 Essential (primary) hypertension: Secondary | ICD-10-CM

## 2017-11-24 LAB — POCT GLYCOSYLATED HEMOGLOBIN (HGB A1C): Hemoglobin A1C: 7.8 % — AB (ref 4.0–5.6)

## 2017-11-24 MED ORDER — DULAGLUTIDE 1.5 MG/0.5ML ~~LOC~~ SOAJ: 1.5000 mg | SUBCUTANEOUS | 5 refills | Status: DC

## 2017-11-24 MED ORDER — METFORMIN HCL 1000 MG PO TABS: ORAL_TABLET | ORAL | 3 refills | Status: DC

## 2017-11-24 MED ORDER — LOSARTAN POTASSIUM-HCTZ 100-25 MG PO TABS: 1.0000 | ORAL_TABLET | Freq: Every day | ORAL | 1 refills | Status: DC

## 2017-11-24 MED ORDER — INSULIN DEGLUDEC 100 UNIT/ML ~~LOC~~ SOPN: 70.0000 [IU] | PEN_INJECTOR | Freq: Every day | SUBCUTANEOUS | 9 refills | Status: DC

## 2017-11-24 MED ORDER — LEVOTHYROXINE SODIUM 150 MCG PO TABS: 150.0000 ug | ORAL_TABLET | Freq: Every day | ORAL | 1 refills | Status: DC

## 2017-11-24 NOTE — Progress Notes (Signed)
Subjective:    CC: DM  HPI:  Diabetes - no hypoglycemic events. No wounds or sores that are not healing well. No increased thirst or urination. Checking glucose at home. Taking medications as prescribed without any side effects.  She is up to 98 units on her [place] and doing well.  She brought in her blood glucose log.  The lowest blood glucose that she is had is in the low 80s and she the highest she is had is in the 170s over the last month.  Hypertension- Pt denies chest pain, SOB, dizziness, or heart palpitations.  Taking meds as directed w/o problems.  Denies medication side effects.    Recurrent yeast infections-she says she is been doing better in that regard.  They have actually put her on prophylaxis taking a Diflucan once a week for 4 weeks.  Past medical history, Surgical history, Family history not pertinant except as noted below, Social history, Allergies, and medications have been entered into the medical record, reviewed, and corrections made.   Review of Systems: No fevers, chills, night sweats, weight loss, chest pain, or shortness of breath.   Objective:    General: Well Developed, well nourished, and in no acute distress.  Neuro: Alert and oriented x3, extra-ocular muscles intact, sensation grossly intact.  HEENT: Normocephalic, atraumatic  Skin: Warm and dry, no rashes. Cardiac: Regular rate and rhythm, no murmurs rubs or gallops, no lower extremity edema.  Respiratory: Clear to auscultation bilaterally. Not using accessory muscles, speaking in full sentences.   Impression and Recommendations:    DM - Much improved. A1C is down to 7.8 which is great progress just encouraged her to continue to work.  Continue with 78 units of Tresiba daily.  Her labs are up-to-date.  HTN  - Well controlled. Continue current regimen. Follow up in  6 months.    Current yeast infections-currently on once a week Diflucan for the next 4 weeks per her OB/GYN.  We discussed that getting  her blood sugars under better control should make a big difference in her susceptibility to these.

## 2017-12-12 ENCOUNTER — Other Ambulatory Visit: Payer: Self-pay | Admitting: Family Medicine

## 2017-12-12 DIAGNOSIS — IMO0001 Reserved for inherently not codable concepts without codable children: Secondary | ICD-10-CM

## 2017-12-12 DIAGNOSIS — E1165 Type 2 diabetes mellitus with hyperglycemia: Principal | ICD-10-CM

## 2017-12-22 ENCOUNTER — Other Ambulatory Visit: Payer: Self-pay | Admitting: Family Medicine

## 2018-01-07 ENCOUNTER — Telehealth: Payer: Self-pay

## 2018-01-07 NOTE — Telephone Encounter (Signed)
Pamela Holloway called and complains of diarrhea for the last month and daily for the last week. She has taken Imodium OTC without relief. She wanted to know if there was something else she can take. Please advise.

## 2018-01-07 NOTE — Telephone Encounter (Signed)
Patient advised and scheduled.  

## 2018-01-07 NOTE — Telephone Encounter (Signed)
If the immodium is not better then needs appt. May need further workup

## 2018-01-08 ENCOUNTER — Other Ambulatory Visit: Payer: Self-pay | Admitting: Family Medicine

## 2018-01-08 ENCOUNTER — Ambulatory Visit (INDEPENDENT_AMBULATORY_CARE_PROVIDER_SITE_OTHER): Payer: BC Managed Care – PPO | Admitting: Family Medicine

## 2018-01-08 ENCOUNTER — Encounter: Payer: Self-pay | Admitting: Family Medicine

## 2018-01-08 VITALS — BP 136/66 | HR 79 | Temp 97.7°F | Wt 180.5 lb

## 2018-01-08 DIAGNOSIS — R197 Diarrhea, unspecified: Secondary | ICD-10-CM

## 2018-01-08 NOTE — Patient Instructions (Signed)
Chronic Diarrhea Diarrhea is a condition in which a person passes frequent loose and watery stools. It can cause you to feel weak and dehydrated. Dehydration can make you tired and thirsty. It can also cause a dry mouth, decreased urination, and dark yellow urine. Diarrhea is a sign of another underlying problem, such as:  Infection.  Medication side effects.  Dietary intolerance, such as lactose intolerance.  Conditions such as celiac disease, irritable bowel syndrome (IBS), or inflammatory bowel disease (IBD).  In most cases, diarrhea lasts 2-3 days. Diarrhea that lasts longer than 4 weeks is called long-lasting (chronic) diarrhea. It is important that you treat your diarrhea as told by your health care provider. Follow these instructions at home: Follow these recommendations as told by your health care provider. Eating and drinking  Take an oral rehydration solution (ORS). This is a drink that is designed to keep you hydrated. It can be found at pharmacies and retail stores.  Drink clear fluids, such as water, ice chips, diluted fruit juice, and low-calorie sports drinks.  Follow the diet recommended by your health care provider. You may need to avoid foods that trigger diarrhea for you.  Avoid foods and beverages that contain a lot of sugar or caffeine.  Avoid alcohol.  Avoid spicy or fatty foods. General instructions  Drink enough fluid to keep your urine clear or pale yellow.  Wash your hands often and after each diarrhea episode. If soap and water are not available, use hand sanitizer.  Make sure that all people in your household wash their hands well and often.  Take over-the-counter and prescription medicines only as told by your health care provider.  If you were prescribed an antibiotic medicine, take it as told by your health care provider. Do not stop taking the antibiotic even if you start to feel better.  Rest at home while you recover.  Watch your condition  for any changes.  Take a warm bath to relieve any burning or pain from frequent diarrhea episodes.  Keep all follow-up visits as told by your health care provider. This is important. Contact a health care provider if:  You have a fever.  Your diarrhea gets worse or does not get better.  You have new symptoms.  You cannot drink fluids without vomiting.  You feel light-headed or dizzy.  You have a headache.  You have muscle cramps.  You have severe pain in the rectum. Get help right away if:  You have persistent vomiting.  You have chest pain.  You feel extremely weak or you faint.  You have bloody or black stools, or stools that look like tar.  You have severe pain, cramping, or bloating in your abdomen, or pain that stays in one place.  You have trouble breathing or you are breathing very quickly.  Your heart is beating very quickly.  Your skin feels cold and clammy.  You feel confused.  You have a severe headache.  You have signs of dehydration, such as: ? Dark urine, very little urine, or no urine. ? Cracked lips. ? Dry mouth. ? Sunken eyes. ? Sleepiness. ? Weakness. Summary  Chronic diarrhea is a condition in which a person passes frequent loose and watery stools for more than 4 weeks.  Diarrhea is a sign of another underlying problem.  Drink enough fluid to keep your urine clear or pale yellow to avoid dehydration.  Wash your hands often and after each diarrhea episode. If soap and water are not available, use   hand sanitizer.  It is important that you treat your diarrhea as told by your health care provider. This information is not intended to replace advice given to you by your health care provider. Make sure you discuss any questions you have with your health care provider. Document Released: 04/06/2003 Document Revised: 12/04/2015 Document Reviewed: 12/04/2015 Elsevier Interactive Patient Education  2017 Elsevier Inc.  

## 2018-01-08 NOTE — Progress Notes (Signed)
Acute Office Visit  Subjective:    Patient ID: Pamela Holloway, female    DOB: 01/28/1961, 57 y.o.   MRN: [id number]  Chief Complaint  Patient presents with  . Diarrhea    HPI Patient is in today for Diarrhea x 1 months.  Its occurring almost daily.  She is even tried Imodium over-the-counter without any significant relief. Watery stools.  Will wak up in the middle of the night and have stooled.  Hasn't been able to work.  No fever or blood in the stools.     Been trying to eat a bland diet with chicken and rice but it does not really seem to help.  She is going through about a box of the Imodium a week.  Now her stools are actually turning more bright yellow.  She has not been on any antibiotics in the last several months.  She says she will get a lot of gurgling in her stomach and a little bit of discomfort but no severe pain.  She has been pretty nauseated but has not actually vomited.  She has had a few days where she was belching which is very unlike her she does not normally have reflux symptoms.  Past Medical History:  Diagnosis Date  . Cancer ([place])    Melanoma-Stage 3  . Diabetes mellitus without complication ([place])   . Family history of adverse reaction to anesthesia    son had n/v  . Hepatitis    h/o hep C, 2012 no signs  . Hyperlipidemia   . Hypertension   . Hypothyroidism   . Thyroid disease    Hypothyroid    Past Surgical History:  Procedure Laterality Date  . CHOLECYSTECTOMY    . LAPAROSCOPIC GASTRIC BANDING    . MELANOMA EXCISION    . TONSILLECTOMY    . TOTAL HIP ARTHROPLASTY Right 08/28/2015   Procedure: TOTAL HIP ARTHROPLASTY ANTERIOR APPROACH;  Surgeon: Frederik Pear, MD;  Location: [facility];  Service: Orthopedics;  Laterality: Right;    Family History  Problem Relation Age of Onset  . Diabetes Mother   . Hypertension Mother   . Heart disease Mother   . Diabetes Father   . Hypertension Father     Social History   Socioeconomic History  . Marital status:  Married    Spouse name: Not on file  . Number of children: Not on file  . Years of education: Not on file  . Highest education level: Not on file  Occupational History  . Not on file  Social Needs  . Financial resource strain: Not on file  . Food insecurity:    Worry: Not on file    Inability: Not on file  . Transportation needs:    Medical: Not on file    Non-medical: Not on file  Tobacco Use  . Smoking status: Former Smoker    Packs/day: 2.00    Years: 12.00    Pack years: 24.00    Types: Cigarettes  . Smokeless tobacco: Never Used  Substance and Sexual Activity  . Alcohol use: Yes    Alcohol/week: 0.0 standard drinks    Comment: rare  . Drug use: No  . Sexual activity: Yes    Birth control/protection: Other-see comments    Comment: vasectomy-1st intercourse 21 yo-1 partner  Lifestyle  . Physical activity:    Days per week: Not on file    Minutes per session: Not on file  . Stress: Not on file  Relationships  .  Social connections:    Talks on phone: Not on file    Gets together: Not on file    Attends religious service: Not on file    Active member of club or organization: Not on file    Attends meetings of clubs or organizations: Not on file    Relationship status: Not on file  . Intimate partner violence:    Fear of current or ex partner: Not on file    Emotionally abused: Not on file    Physically abused: Not on file    Forced sexual activity: Not on file  Other Topics Concern  . Not on file  Social History Narrative  . Not on file    Outpatient Medications Prior to Visit  Medication Sig Dispense Refill  . CREON 36000 units CPEP capsule Take 1 capsule by mouth 3 (three) times daily with meals.  11  . Dulaglutide (TRULICITY) 1.5 YJ/8.5UD SOPN Inject 1.5 mg into the skin every 7 (seven) days. 12 pen 5  . FARXIGA 10 MG TABS tablet TAKE 1 TABLET DAILY 90 tablet 3  . fluconazole (DIFLUCAN) 150 MG tablet Take 1 tablet (150 mg total) by mouth once a week. For  8 weeks to help prevent yeast infections 4 tablet 1  . glipiZIDE (GLUCOTROL) 10 MG tablet TAKE 1 TABLET TWICE DAILY  BEFORE MEALS. 180 tablet 1  . hydrochlorothiazide (HYDRODIURIL) 25 MG tablet TAKE 1/2 TABLET (12.5MG     TOTAL) DAILY 45 tablet 2  . Insulin Pen Needle (PEN NEEDLES) 32G X 4 MM MISC Inject 1 each into the skin daily. Use daily to inject tresiba 90 each 3  . levothyroxine (SYNTHROID, LEVOTHROID) 50 MCG tablet Take 1 tablet (50 mcg total) by mouth every 7 (seven) days. 90 tablet 2  . losartan-hydrochlorothiazide (HYZAAR) 100-25 MG tablet Take 1 tablet by mouth daily. 90 tablet 1  . metFORMIN (GLUCOPHAGE) 1000 MG tablet TAKE 1 TABLET TWICE A DAY  WITH A MEAL 180 tablet 3  . omega-3 acid ethyl esters (LOVAZA) 1 g capsule TAKE 2 CAPSULES TWICE DAILY 360 capsule 3  . omeprazole (PRILOSEC) 20 MG capsule Take 20 mg by mouth daily.  3  . pravastatin (PRAVACHOL) 40 MG tablet TAKE 1 TABLET DAILY 90 tablet 3  . SYNTHROID 150 MCG tablet TAKE 1 TABLET DAILY BEFORE BREAKFAST 90 tablet 3  . TRESIBA FLEXTOUCH 100 UNIT/ML SOPN FlexTouch Pen INJECT 20-40 UNITS TOTAL INTO THE SKIN AT BEDTIME. 15 mL 6  . UNKNOWN TO PATIENT Chemo for Melanoma     No facility-administered medications prior to visit.     Allergies  Allergen Reactions  . Aspirin Swelling    REACTION: lips swell Swelling of lip Swelling of lip Swelling of lip Swelling of lip   . Atorvastatin Other (See Comments)    Myalgia   . Invokana [Canagliflozin] Other (See Comments)    Recurrent UTI  . Old Tuberculin Other (See Comments)    Other reaction(s): Other  . Potassium Diarrhea  . Bee Venom   . Other     Other reaction(s): Other (See Comments) TB Skin test caused local reaction of redness TB Skin test caused local reaction of redness     ROS     Objective:    Physical Exam  Constitutional: She is oriented to person, place, and time. She appears well-developed and well-nourished.  HENT:  Head: Normocephalic and  atraumatic.  Cardiovascular: Normal rate, regular rhythm and normal heart sounds.  Pulmonary/Chest: Effort normal and breath sounds  normal.  Abdominal: Soft. Bowel sounds are normal. She exhibits no distension and no mass. There is no abdominal tenderness. There is no rebound and no guarding.  Neurological: She is alert and oriented to person, place, and time.  Skin: Skin is warm and dry.  Psychiatric: She has a normal mood and affect. Her behavior is normal.    BP 136/66   Pulse 79   Temp 97.7 F (36.5 C) (Oral)   Wt 180 lb 8 oz (81.9 kg)   LMP 09/29/2011   BMI 30.98 kg/m  Wt Readings from Last 3 Encounters:  01/08/18 180 lb 8 oz (81.9 kg)  11/24/17 180 lb (81.6 kg)  08/19/17 178 lb 8 oz (81 kg)    Health Maintenance Due  Topic Date Due  . PNEUMOCOCCAL POLYSACCHARIDE VACCINE AGE 44-64 HIGH RISK  03/07/1962    There are no preventive care reminders to display for this patient.   Lab Results  Component Value Date   TSH 1.21 08/22/2016   Lab Results  Component Value Date   WBC 6.6 11/11/2017   HGB 14.7 11/11/2017   HCT 45 11/11/2017   MCV 87.5 08/29/2015   PLT 208 11/11/2017   Lab Results  Component Value Date   NA 141 11/11/2017   K 3.5 11/11/2017   CO2 26 07/11/2016   GLUCOSE 216 (H) 07/11/2016   BUN 15 11/11/2017   CREATININE 0.7 11/11/2017   BILITOT 0.5 08/22/2016   ALKPHOS 89 08/08/2017   AST 20 11/11/2017   ALT 24 11/11/2017   PROT 7.2 08/22/2016   ALBUMIN 3.9 08/22/2016   CALCIUM 9.8 07/11/2016   ANIONGAP 12 08/29/2015   Lab Results  Component Value Date   CHOL 131 11/11/2017   Lab Results  Component Value Date   HDL 34 (A) 11/11/2017   Lab Results  Component Value Date   LDLCALC 47 11/11/2017   Lab Results  Component Value Date   TRIG 251 (A) 11/11/2017   Lab Results  Component Value Date   CHOLHDL 6.5 (H) 07/11/2016   Lab Results  Component Value Date   HGBA1C 7.8 (A) 11/24/2017       Assessment & Plan:   Problem List Items  Addressed This Visit    None    Visit Diagnoses    Diarrhea, unspecified type    -  Primary   Relevant Orders   Stool culture   Clostridium difficile Toxin B, Qualitative, Real-Time PCR([facility])   CBC with Differential/Platelet   COMPLETE METABOLIC PANEL WITH GFR   Hepatitis panel, acute     Diarrhea-her symptoms seem to be persisting and not improving.  We will do a culture just to rule out Salmonella Shigella etc.  She has not been seeing any blood which is very reassuring.  Also check her liver enzymes as well.  She has already had a cholecystectomy.  We will also check for C. difficile though she has not been on any antibiotics recently.  In the meantime gave some additional information about chronic diarrhea.  It sounds like she is eating a mostly bland diet.  No orders of the defined types were placed in this encounter.    Beatrice Lecher, MD

## 2018-01-09 ENCOUNTER — Other Ambulatory Visit: Payer: Self-pay | Admitting: *Deleted

## 2018-01-09 DIAGNOSIS — E876 Hypokalemia: Secondary | ICD-10-CM

## 2018-01-13 ENCOUNTER — Other Ambulatory Visit: Payer: Self-pay | Admitting: Family Medicine

## 2018-01-13 LAB — CBC WITH DIFFERENTIAL/PLATELET
Absolute Monocytes: 435 cells/uL (ref 200–950)
Basophils Absolute: 29 cells/uL (ref 0–200)
Basophils Relative: 0.5 %
EOS PCT: 1.7 %
Eosinophils Absolute: 99 cells/uL (ref 15–500)
HCT: 47.7 % — ABNORMAL HIGH (ref 35.0–45.0)
Hemoglobin: 16 g/dL — ABNORMAL HIGH (ref 11.7–15.5)
Lymphs Abs: 1653 cells/uL (ref 850–3900)
MCH: 27.3 pg (ref 27.0–33.0)
MCHC: 33.5 g/dL (ref 32.0–36.0)
MCV: 81.4 fL (ref 80.0–100.0)
MPV: 11.5 fL (ref 7.5–12.5)
Monocytes Relative: 7.5 %
Neutro Abs: 3584 cells/uL (ref 1500–7800)
Neutrophils Relative %: 61.8 %
Platelets: 233 10*3/uL (ref 140–400)
RBC: 5.86 10*6/uL — ABNORMAL HIGH (ref 3.80–5.10)
RDW: 13.7 % (ref 11.0–15.0)
Total Lymphocyte: 28.5 %
WBC: 5.8 10*3/uL (ref 3.8–10.8)

## 2018-01-13 LAB — COMPLETE METABOLIC PANEL WITH GFR
AG Ratio: 1.6 (calc) (ref 1.0–2.5)
ALBUMIN MSPROF: 4.6 g/dL (ref 3.6–5.1)
ALT: 31 U/L — ABNORMAL HIGH (ref 6–29)
AST: 27 U/L (ref 10–35)
Alkaline phosphatase (APISO): 73 U/L (ref 33–130)
BILIRUBIN TOTAL: 1.6 mg/dL — AB (ref 0.2–1.2)
BUN: 15 mg/dL (ref 7–25)
CHLORIDE: 102 mmol/L (ref 98–110)
CO2: 25 mmol/L (ref 20–32)
CREATININE: 0.83 mg/dL (ref 0.50–1.05)
Calcium: 10.2 mg/dL (ref 8.6–10.4)
GFR, EST AFRICAN AMERICAN: 90 mL/min/{1.73_m2} (ref >=60)
GFR, Est Non African American: 78 mL/min/{1.73_m2} (ref >=60)
GLUCOSE: 157 mg/dL — AB (ref 65–99)
Globulin: 2.8 g/dL (calc) (ref 1.9–3.7)
Potassium: 3.3 mmol/L — ABNORMAL LOW (ref 3.5–5.3)
Sodium: 141 mmol/L (ref 135–146)
TOTAL PROTEIN: 7.4 g/dL (ref 6.1–8.1)

## 2018-01-13 LAB — HCV RNA,QUANTITATIVE REAL TIME PCR
HCV Quantitative Log: <1.18 Log IU/mL
HCV RNA, PCR, QN: <15 IU/mL

## 2018-01-13 LAB — HEPATITIS PANEL, ACUTE
HEP B S AG: NONREACTIVE
Hep A IgM: NONREACTIVE
Hep B C IgM: NONREACTIVE
Hepatitis C Ab: REACTIVE — AB
SIGNAL TO CUT-OFF: 12.2 — ABNORMAL HIGH (ref <1.00)

## 2018-01-15 ENCOUNTER — Other Ambulatory Visit: Payer: Self-pay | Admitting: Family Medicine

## 2018-01-15 DIAGNOSIS — R748 Abnormal levels of other serum enzymes: Secondary | ICD-10-CM

## 2018-01-15 NOTE — Progress Notes (Signed)
Per result notes:  "Your bilirubin and one of the liver enzymes is a little borderline as well. Plan to recheck Hepatic function in 4 weeks"  CMP ordered and faxed to lab

## 2018-01-16 ENCOUNTER — Other Ambulatory Visit: Payer: Self-pay | Admitting: *Deleted

## 2018-01-16 MED ORDER — INSULIN DEGLUDEC 100 UNIT/ML ~~LOC~~ SOPN: PEN_INJECTOR | SUBCUTANEOUS | 6 refills | Status: DC

## 2018-01-17 LAB — STOOL CULTURE
MICRO NUMBER:: 91511443
MICRO NUMBER:: 91511444
MICRO NUMBER:: 91511445
SHIGA RESULT:: NOT DETECTED
SPECIMEN QUALITY:: ADEQUATE
SPECIMEN QUALITY:: ADEQUATE
SPECIMEN QUALITY:: ADEQUATE

## 2018-01-17 LAB — CLOSTRIDIUM DIFFICILE TOXIN B, QUALITATIVE, REAL-TIME PCR: Toxigenic C. Difficile by PCR: NOT DETECTED

## 2018-01-18 ENCOUNTER — Other Ambulatory Visit: Payer: Self-pay | Admitting: Family Medicine

## 2018-01-22 ENCOUNTER — Other Ambulatory Visit: Payer: Self-pay

## 2018-01-22 DIAGNOSIS — R748 Abnormal levels of other serum enzymes: Secondary | ICD-10-CM

## 2018-02-27 ENCOUNTER — Encounter: Payer: Self-pay | Admitting: Family Medicine

## 2018-02-27 ENCOUNTER — Ambulatory Visit: Payer: BC Managed Care – PPO | Admitting: Family Medicine

## 2018-02-27 VITALS — BP 127/69 | HR 92 | Ht 64.0 in | Wt 190.0 lb

## 2018-02-27 DIAGNOSIS — E1165 Type 2 diabetes mellitus with hyperglycemia: Secondary | ICD-10-CM

## 2018-02-27 DIAGNOSIS — IMO0001 Reserved for inherently not codable concepts without codable children: Secondary | ICD-10-CM

## 2018-02-27 DIAGNOSIS — I10 Essential (primary) hypertension: Secondary | ICD-10-CM | POA: Diagnosis not present

## 2018-02-27 DIAGNOSIS — M5412 Radiculopathy, cervical region: Secondary | ICD-10-CM | POA: Diagnosis not present

## 2018-02-27 LAB — POCT GLYCOSYLATED HEMOGLOBIN (HGB A1C): Hemoglobin A1C: 6.8 % — AB (ref 4.0–5.6)

## 2018-02-27 MED ORDER — INSULIN DEGLUDEC 100 UNIT/ML ~~LOC~~ SOPN: PEN_INJECTOR | SUBCUTANEOUS | 3 refills | Status: DC

## 2018-02-27 MED ORDER — MELOXICAM 15 MG PO TABS: 15.0000 mg | ORAL_TABLET | Freq: Every day | ORAL | 3 refills | Status: DC

## 2018-02-27 NOTE — Progress Notes (Signed)
Subjective:    CC: DM  HPI:  Diabetes - no hypoglycemic events. No wounds or sores that are not healing well. No increased thirst or urination. Checking glucose at home. Taking medications as prescribed without any side effects.  Currently taking between 120 and 130 units of Tresiba at bedtime.  She will need this adjusted and sent to her mail order.  Cervical  radiculopathy-she has been using Mobic as needed.  This was previously prescribed by Dr. Dianah Field and would like me to take over that prescription.  Hypertension- Pt denies chest pain, SOB, dizziness, or heart palpitations.  Taking meds as directed w/o problems.  Denies medication side effects.      Past medical history, Surgical history, Family history not pertinant except as noted below, Social history, Allergies, and medications have been entered into the medical record, reviewed, and corrections made.   Review of Systems: No fevers, chills, night sweats, weight loss, chest pain, or shortness of breath.   Objective:    General: Well Developed, well nourished, and in no acute distress.  Neuro: Alert and oriented x3, extra-ocular muscles intact, sensation grossly intact.  HEENT: Normocephalic, atraumatic  Skin: Warm and dry, no rashes. Cardiac: Regular rate and rhythm, no murmurs rubs or gallops, no lower extremity edema.  Respiratory: Clear to auscultation bilaterally. Not using accessory muscles, speaking in full sentences.   Impression and Recommendations:    DM -Improved.  A1c under 7 today.  Down to 6.8 which is fantastic. F/U 3 mo.    Cervical radiculopathy - will refill her meloxicam.   HTN - Well controlled. Continue current regimen. Follow up in  3-4 months.

## 2018-03-02 ENCOUNTER — Encounter: Payer: Self-pay | Admitting: Family Medicine

## 2018-03-08 ENCOUNTER — Other Ambulatory Visit: Payer: Self-pay | Admitting: Family Medicine

## 2018-03-08 ENCOUNTER — Other Ambulatory Visit: Payer: Self-pay | Admitting: Osteopathic Medicine

## 2018-03-17 ENCOUNTER — Other Ambulatory Visit (HOSPITAL_BASED_OUTPATIENT_CLINIC_OR_DEPARTMENT_OTHER): Payer: Self-pay | Admitting: Gynecology

## 2018-03-17 DIAGNOSIS — Z1231 Encounter for screening mammogram for malignant neoplasm of breast: Secondary | ICD-10-CM

## 2018-04-03 ENCOUNTER — Encounter: Payer: Self-pay | Admitting: Gynecology

## 2018-04-03 ENCOUNTER — Ambulatory Visit: Payer: BC Managed Care – PPO | Admitting: Gynecology

## 2018-04-03 ENCOUNTER — Encounter: Payer: BC Managed Care – PPO | Admitting: Gynecology

## 2018-04-03 VITALS — BP 124/74 | Ht 63.5 in | Wt 195.0 lb

## 2018-04-03 DIAGNOSIS — N952 Postmenopausal atrophic vaginitis: Secondary | ICD-10-CM

## 2018-04-03 DIAGNOSIS — Z01419 Encounter for gynecological examination (general) (routine) without abnormal findings: Secondary | ICD-10-CM | POA: Diagnosis not present

## 2018-04-03 NOTE — Progress Notes (Signed)
    Pamela Holloway [birth year] [id number]        58 y.o.  [id number] for annual gynecologic exam.  Without gynecologic complaints  Past medical history,surgical history, problem list, medications, allergies, family history and social history were all reviewed and documented as reviewed in the EPIC chart.  ROS:  Performed with pertinent positives and negatives included in the history, assessment and plan.   Additional significant findings : None   Exam: [name] assistant Vitals:   04/03/18 1454  BP: 124/74  Weight: 195 lb (88.5 kg)  Height: 5' 3.5" (1.613 m)   Body mass index is 34 kg/m.  General appearance:  Normal affect, orientation and appearance. Skin: Grossly normal HEENT: Without gross lesions.  No cervical or supraclavicular adenopathy. Thyroid normal.  Lungs:  Clear without wheezing, rales or rhonchi Cardiac: RR, without RMG Abdominal:  Soft, nontender, without masses, guarding, rebound, organomegaly or hernia Breasts:  Examined lying and sitting without masses, retractions, discharge or axillary adenopathy. Pelvic:  Ext, BUS, Vagina: Normal with mild atrophic changes  Cervix: With mild atrophic changes.  Pap smear done  Uterus: Grossly normal size midline mobile nontender  Adnexa: Without masses or tenderness    Anus and perineum: Normal   Rectovaginal: Normal sphincter tone without palpated masses or tenderness.    Assessment/Plan:  58 y.o. [id number] female for annual gynecologic exam.  1. Postmenopausal.  No significant menopausal symptoms or any vaginal bleeding. 2. Pap smear 2018.  Pap smear done today due to patient having been on chemotherapy discontinued this past year.  If this Pap smear is normal then we will plan continuing less frequent screening intervals.  No history of significant abnormal Pap smears previously. 3. Mammography coming due in April and I reminded her to schedule this.  Breast exam normal today. 4. Colonoscopy 2017.  Repeat at their  recommended interval. 5. DEXA never.  Will plan further into the menopause. 6. Health maintenance.  No routine lab work done as patient does this elsewhere.  Follow-up 1 year, sooner as needed.   [name], 3:11 PM 04/03/2018

## 2018-04-03 NOTE — Patient Instructions (Signed)
Follow-up in 1 year for annual exam, sooner if any issues. 

## 2018-04-03 NOTE — Addendum Note (Signed)
Addended by: Nelva Nay on: 04/03/2018 03:35 PM   Modules accepted: Orders

## 2018-04-06 LAB — PAP IG W/ RFLX HPV ASCU

## 2018-04-08 ENCOUNTER — Ambulatory Visit: Payer: BC Managed Care – PPO | Admitting: Podiatrist

## 2018-04-08 ENCOUNTER — Ambulatory Visit (INDEPENDENT_AMBULATORY_CARE_PROVIDER_SITE_OTHER): Payer: BC Managed Care – PPO

## 2018-04-08 ENCOUNTER — Other Ambulatory Visit: Payer: Self-pay | Admitting: Podiatrist

## 2018-04-08 VITALS — BP 159/75 | HR 86

## 2018-04-08 DIAGNOSIS — Q828 Other specified congenital malformations of skin: Secondary | ICD-10-CM

## 2018-04-08 DIAGNOSIS — M79672 Pain in left foot: Secondary | ICD-10-CM | POA: Diagnosis not present

## 2018-04-08 DIAGNOSIS — M216X2 Other acquired deformities of left foot: Secondary | ICD-10-CM

## 2018-04-08 DIAGNOSIS — R399 Unspecified symptoms and signs involving the genitourinary system: Secondary | ICD-10-CM | POA: Insufficient documentation

## 2018-04-08 NOTE — Progress Notes (Signed)
  Chief Complaint  Patient presents with  . Foot Pain    Left foot sub 5th painful spot on plantar foot, pt states feels like it has glass stuck in her foot, sharp pain, 69mo duration.     HPI: Patient is 58 y.o. female who presents today for a painful spot submetatarsal 5 left foot.  She has been applying an acid to it but she states it still hurts.   Review of Systems  DATA OBTAINED: from patient  GENERAL: Feels well no fevers, no fatigue, no changes in appetite SKIN: No itching, no rashes, no open wounds EYES: No eye pain,no redness, no discharge EARS: No earache,no ringing of ears, NOSE: No congestion, no drainage, no bleeding  MOUTH/THROAT: No mouth pain, No sore throat, No difficulty chewing or swallowing  RESPIRATORY: No cough, no wheezing, no SOB CARDIAC: No chest pain,no heart palpitations, GI: No abdominal pain, No Nausea, no vomiting, no diarrhea, no heartburn or no reflux  GU: No dysuria, no increased frequency or urgency MUSCULOSKELETAL: No unrelieved bone/joint pain,  NEUROLOGIC: Awake, alert, appropriate to situation, No change in mental status. PSYCHIATRIC: No overt anxiety or sadness.No behavior issue.      Physical Exam  GENERAL APPEARANCE: Alert, conversant. Appropriately groomed. No acute distress.   VASCULAR: Pedal pulses palpable DP and PT bilateral.  Capillary refill time is immediate to all digits,  Proximal to distal cooling it warm to warm.  Digital hair growth is present bilateral   NEUROLOGIC: sensation is intact epicritically and protectively to 5.07 monofilament at 5/5 sites bilateral.  Light touch is intact bilateral, vibratory sensation intact bilateral, achilles tendon reflex is intact bilateral.   MUSCULOSKELETAL: acceptable muscle strength, tone and stability bilateral.  Intrinsic muscluature intact bilateral.  Range of motion at ankle and first MPJ is normal bilateral.  Rectus foot type noted  DERMATOLOGIC: skin is warm, supple, and dry.  No  open lesions noted.  No interdigital maceration noted bilateral.  A discrete for keratotic lesion is present submetatarsal 5 of the left foot.  A central area of wax-like material is removed with debridement which is likely the cause of her discomfort.  Radiographic exam:  Normal osseous mineralization.  No fracture or dislocation or acute osseous abnormalities present.  Joint spaces are normal.  No sign of foreign body    Assessment   Intractable porokeratosis submetatarsal 5 left foot  Plan  Careful debridement of the porokeratosis is accomplished at today's visit.  Salena cane is applied.  Instructed her to continue to use the acid as she is doing a great job using it carefully if the porokeratotic lesion returns.  She will see me as needed.

## 2018-04-11 ENCOUNTER — Encounter: Payer: Self-pay | Admitting: Podiatrist

## 2018-04-13 ENCOUNTER — Other Ambulatory Visit: Payer: Self-pay

## 2018-04-13 ENCOUNTER — Ambulatory Visit (HOSPITAL_BASED_OUTPATIENT_CLINIC_OR_DEPARTMENT_OTHER)
Admission: RE | Admit: 2018-04-13 | Discharge: 2018-04-13 | Disposition: A | Discharge status: Home / Self Care | Payer: BC Managed Care – PPO | Source: Ambulatory Visit | Attending: Gynecology | Admitting: Gynecology

## 2018-04-13 ENCOUNTER — Encounter (HOSPITAL_BASED_OUTPATIENT_CLINIC_OR_DEPARTMENT_OTHER): Payer: Self-pay

## 2018-04-13 DIAGNOSIS — Z1231 Encounter for screening mammogram for malignant neoplasm of breast: Secondary | ICD-10-CM | POA: Insufficient documentation

## 2018-04-26 ENCOUNTER — Other Ambulatory Visit: Payer: Self-pay | Admitting: Family Medicine

## 2018-05-29 ENCOUNTER — Ambulatory Visit: Payer: BC Managed Care – PPO | Admitting: Family Medicine

## 2018-06-19 ENCOUNTER — Other Ambulatory Visit: Payer: Self-pay | Admitting: Family Medicine

## 2018-06-19 DIAGNOSIS — IMO0001 Reserved for inherently not codable concepts without codable children: Secondary | ICD-10-CM

## 2018-07-17 ENCOUNTER — Other Ambulatory Visit: Payer: Self-pay | Admitting: Family Medicine

## 2018-07-21 ENCOUNTER — Telehealth: Payer: Self-pay | Admitting: Family Medicine

## 2018-07-21 NOTE — Telephone Encounter (Signed)
[facility] call [facility].  [facility] [place] that she is not on a statin.  Per our records she should be taking pravastatin so I am not sure if she is running it without insurance and they are not seeing it.  But we filled a 1 year prescription in March of this year.

## 2018-07-22 NOTE — Telephone Encounter (Signed)
Patient states she is taking the Pravastatin daily and she gets it through [facility].

## 2018-07-23 ENCOUNTER — Encounter: Payer: Self-pay | Admitting: Family Medicine

## 2018-07-23 ENCOUNTER — Ambulatory Visit (INDEPENDENT_AMBULATORY_CARE_PROVIDER_SITE_OTHER): Payer: BC Managed Care – PPO | Admitting: Family Medicine

## 2018-07-23 VITALS — BP 124/72 | HR 68 | Ht 64.0 in | Wt 199.0 lb

## 2018-07-23 DIAGNOSIS — E039 Hypothyroidism, unspecified: Secondary | ICD-10-CM | POA: Diagnosis not present

## 2018-07-23 DIAGNOSIS — C4362 Malignant melanoma of left upper limb, including shoulder: Secondary | ICD-10-CM

## 2018-07-23 DIAGNOSIS — IMO0001 Reserved for inherently not codable concepts without codable children: Secondary | ICD-10-CM

## 2018-07-23 DIAGNOSIS — Z794 Long term (current) use of insulin: Secondary | ICD-10-CM

## 2018-07-23 DIAGNOSIS — E1165 Type 2 diabetes mellitus with hyperglycemia: Secondary | ICD-10-CM

## 2018-07-23 DIAGNOSIS — E118 Type 2 diabetes mellitus with unspecified complications: Secondary | ICD-10-CM | POA: Diagnosis not present

## 2018-07-23 DIAGNOSIS — I10 Essential (primary) hypertension: Secondary | ICD-10-CM | POA: Diagnosis not present

## 2018-07-23 DIAGNOSIS — R202 Paresthesia of skin: Secondary | ICD-10-CM

## 2018-07-23 LAB — TSH: TSH: 1.2 (ref 0.41–5.90)

## 2018-07-23 LAB — POCT GLYCOSYLATED HEMOGLOBIN (HGB A1C): Hemoglobin A1C: 6.3 % — AB (ref 4.0–5.6)

## 2018-07-23 LAB — CBC AND DIFFERENTIAL
HCT: 48 — AB (ref 36–46)
Hemoglobin: 16 (ref 12.0–16.0)
Platelets: 182 (ref 150–399)
WBC: 6.6

## 2018-07-23 LAB — HEMOGLOBIN A1C: Hemoglobin A1C: 6.4

## 2018-07-23 LAB — BASIC METABOLIC PANEL
Glucose: 146
Potassium: 3.3 — AB (ref 3.4–5.3)
Sodium: 140 (ref 137–147)

## 2018-07-23 LAB — HEPATIC FUNCTION PANEL
ALT: 52 — AB (ref 7–35)
AST: 37 — AB (ref 13–35)

## 2018-07-23 LAB — POCT UA - MICROALBUMIN
Albumin/Creatinine Ratio, Urine, POC: <30
Creatinine, POC: 200 mg/dL
Microalbumin Ur, POC: 30 mg/L

## 2018-07-23 LAB — LIPID PANEL
Cholesterol: 182 (ref 0–200)
HDL: 39 (ref 35–70)
Triglycerides: 411 — AB (ref 40–160)

## 2018-07-23 NOTE — Assessment & Plan Note (Signed)
Doing well overall with no sign of recurrence.  Though she does have some additional evaluation of the lymph nodes scheduled for Monday.  They are now following her every 6 months.

## 2018-07-23 NOTE — Assessment & Plan Note (Signed)
We well on her current regimen but we have not had a level drawn recently.  We will get this up-to-date.  She has gained some weight but feels like it is because she is been home more because of COVID and she is been cooking a lot more.

## 2018-07-23 NOTE — Progress Notes (Addendum)
Established Patient Office Visit  Subjective:  Patient ID: Pamela Holloway, female    DOB: 08-28-1960  Age: 58 y.o. MRN: [id number]  CC:  Chief Complaint  Patient presents with  . Diabetes    HPI Pamela Holloway presents for  Diabetes - no hypoglycemic events. No wounds or sores that are not healing well. No increased thirst or urination. Checking glucose at home. Taking medications as prescribed without any side effects.  She says her blood sugars were running a little bit higher so she increased her [place] on her own.  She says she is now up to a total of 220 units a day.  But says that her blood sugars have looked great on this.  She has not had any hypoglycemic events and brought her blood glucose logs from the last 3 months.  She checks it about 4 to 5 days/week on average.  Most of the fasting blood sugars are actually between 108 and 148.  She and her husband have been trying to get out and walk and exercise more which is been great.  Hypertension- Pt denies chest pain, SOB, dizziness, or heart palpitations.  Taking meds as directed w/o problems.  Denies medication side effects.   Hypothyroidism - Taking medication regularly in the AM away from food and vitamins, etc. No recent change to skin, hair, or energy levels.  She also complains of bilateral hand numbness.  She says it does seem to come and go.  It definitely happens at night and she will have to him rub or shake out her hands for them to feel better.  She has been working at home over the last couple of months.  She denies any significant neck injury or pain.  Her wrists have been very sore as well.  Past Medical History:  Diagnosis Date  . Cancer ([place])    Melanoma-Stage 3  . Diabetes mellitus without complication ([place])   . Family history of adverse reaction to anesthesia    son had n/v  . Hepatitis    h/o hep C, 2012 no signs  . Hyperlipidemia   . Hypertension   . Hypothyroidism   . Thyroid disease    Hypothyroid     Past Surgical History:  Procedure Laterality Date  . CHOLECYSTECTOMY    . LAPAROSCOPIC GASTRIC BANDING    . MELANOMA EXCISION    . TONSILLECTOMY    . TOTAL HIP ARTHROPLASTY Right 08/28/2015   Procedure: TOTAL HIP ARTHROPLASTY ANTERIOR APPROACH;  Surgeon: Frederik Pear, MD;  Location: [facility];  Service: Orthopedics;  Laterality: Right;    Family History  Problem Relation Age of Onset  . Diabetes Mother   . Hypertension Mother   . Heart disease Mother   . Diabetes Father   . Hypertension Father     Social History   Socioeconomic History  . Marital status: Married    Spouse name: Not on file  . Number of children: Not on file  . Years of education: Not on file  . Highest education level: Not on file  Occupational History  . Not on file  Social Needs  . Financial resource strain: Not on file  . Food insecurity    Worry: Not on file    Inability: Not on file  . Transportation needs    Medical: Not on file    Non-medical: Not on file  Tobacco Use  . Smoking status: Former Smoker    Packs/day: 2.00    Years: 12.00  Pack years: 24.00    Types: Cigarettes  . Smokeless tobacco: Never Used  Substance and Sexual Activity  . Alcohol use: Yes    Alcohol/week: 0.0 standard drinks    Comment: rare  . Drug use: No  . Sexual activity: Yes    Birth control/protection: Other-see comments    Comment: vasectomy-1st intercourse 26 yo-1 partner  Lifestyle  . Physical activity    Days per week: Not on file    Minutes per session: Not on file  . Stress: Not on file  Relationships  . Social Herbalist on phone: Not on file    Gets together: Not on file    Attends religious service: Not on file    Active member of club or organization: Not on file    Attends meetings of clubs or organizations: Not on file    Relationship status: Not on file  . Intimate partner violence    Fear of current or ex partner: Not on file    Emotionally abused: Not on file    Physically  abused: Not on file    Forced sexual activity: Not on file  Other Topics Concern  . Not on file  Social History Narrative  . Not on file    Outpatient Medications Prior to Visit  Medication Sig Dispense Refill  . BD PEN NEEDLE NANO U/F 32G X 4 MM MISC INJECT 1 EACH INTO THE SKIN DAILY. USE DAILY TO INJECT TRESIBA 100 each 3  . CREON 36000 units CPEP capsule Take 1 capsule by mouth 3 (three) times daily with meals.  11  . Dulaglutide (TRULICITY) 1.5 VH/8.4ON SOPN Inject 1.5 mg into the skin every 7 (seven) days. 12 pen 5  . FARXIGA 10 MG TABS tablet TAKE 1 TABLET DAILY 90 tablet 3  . glipiZIDE (GLUCOTROL) 10 MG tablet TAKE 1 TABLET TWICE DAILY  BEFORE MEALS. 180 tablet 1  . hydrochlorothiazide (HYDRODIURIL) 25 MG tablet TAKE 1/2 TABLET (=12.5MG )  DAILY 45 tablet 2  . insulin degludec (TRESIBA FLEXTOUCH) 100 UNIT/ML SOPN FlexTouch Pen Inject 130 units total into the skin at bedtime (Patient taking differently: 220 Units. Inject 130 units total into the skin at bedtime) 20 pen 3  . losartan-hydrochlorothiazide (HYZAAR) 100-25 MG tablet Take 1 tablet by mouth daily. 90 tablet 1  . meloxicam (MOBIC) 15 MG tablet Take 1 tablet (15 mg total) by mouth daily. 90 tablet 3  . metFORMIN (GLUCOPHAGE) 1000 MG tablet TAKE 1 TABLET TWICE A DAY  WITH A MEAL 180 tablet 3  . omega-3 acid ethyl esters (LOVAZA) 1 g capsule TAKE 2 CAPSULES TWICE DAILY 360 capsule 3  . omeprazole (PRILOSEC) 20 MG capsule Take 20 mg by mouth daily.  3  . pravastatin (PRAVACHOL) 40 MG tablet TAKE 1 TABLET DAILY 90 tablet 3  . SYNTHROID 150 MCG tablet TAKE 1 TABLET DAILY BEFORE BREAKFAST 90 tablet 3  . SYNTHROID 50 MCG tablet TAKE 1 TABLET EVERY 7 DAYS 13 tablet 2  . UNKNOWN TO PATIENT Chemo for Melanoma     No facility-administered medications prior to visit.     Allergies  Allergen Reactions  . Aspirin Swelling    REACTION: lips swell Swelling of lip Swelling of lip Swelling of lip Swelling of lip   . Atorvastatin  Other (See Comments)    Myalgia   . Invokana [Canagliflozin] Other (See Comments)    Recurrent UTI  . Old Tuberculin Other (See Comments)    Other reaction(s): Other  .  Potassium Diarrhea  . Bee Venom   . Cephalexin Diarrhea and Nausea Only  . Other     Other reaction(s): Other (See Comments) TB Skin test caused local reaction of redness TB Skin test caused local reaction of redness     ROS Review of Systems    Objective:    Physical Exam  Constitutional: She is oriented to person, place, and time. She appears well-developed and well-nourished.  HENT:  Head: Normocephalic and atraumatic.  Cardiovascular: Normal rate, regular rhythm and normal heart sounds.  Pulses 2+ bilaterally.  Pulmonary/Chest: Effort normal and breath sounds normal.  Neurological: She is alert and oriented to person, place, and time.  Tinel's test on the right wrist.  When I tapped on the left wrist she had tingling going up towards her elbow.  Wrist with normal range of motion and fingers with normal range of motion.  Skin: Skin is warm and dry.  Psychiatric: She has a normal mood and affect. Her behavior is normal.    BP 124/72   Pulse 68   Ht 5\' 4"  (1.626 m)   Wt 199 lb (90.3 kg)   LMP 09/29/2011   SpO2 99%   BMI 34.16 kg/m  Wt Readings from Last 3 Encounters:  07/23/18 199 lb (90.3 kg)  04/03/18 195 lb (88.5 kg)  02/27/18 190 lb (86.2 kg)     Health Maintenance Due  Topic Date Due  . OPHTHALMOLOGY EXAM  05/29/2018    There are no preventive care reminders to display for this patient.  Lab Results  Component Value Date   TSH 1.21 08/22/2016   Lab Results  Component Value Date   WBC 5.8 01/08/2018   HGB 16.0 (H) 01/08/2018   HCT 47.7 (H) 01/08/2018   MCV 81.4 01/08/2018   PLT 233 01/08/2018   Lab Results  Component Value Date   NA 141 01/08/2018   K 3.3 (L) 01/08/2018   CO2 25 01/08/2018   GLUCOSE 157 (H) 01/08/2018   BUN 15 01/08/2018   CREATININE 0.83 01/08/2018    BILITOT 1.6 (H) 01/08/2018   ALKPHOS 89 08/08/2017   AST 27 01/08/2018   ALT 31 (H) 01/08/2018   PROT 7.4 01/08/2018   ALBUMIN 3.9 08/22/2016   CALCIUM 10.2 01/08/2018   ANIONGAP 12 08/29/2015   Lab Results  Component Value Date   CHOL 131 11/11/2017   Lab Results  Component Value Date   HDL 34 (A) 11/11/2017   Lab Results  Component Value Date   LDLCALC 47 11/11/2017   Lab Results  Component Value Date   TRIG 251 (A) 11/11/2017   Lab Results  Component Value Date   CHOLHDL 6.5 (H) 07/11/2016   Lab Results  Component Value Date   HGBA1C 6.3 (A) 07/23/2018      Assessment & Plan:   Problem List Items Addressed This Visit      Cardiovascular and Mediastinum   HYPERTENSION, BENIGN - Primary    Well controlled. Continue current regimen. Follow up in  6 mo      Relevant Orders   COMPLETE METABOLIC PANEL WITH GFR   Lipid panel   Hemoglobin A1c   TSH     Endocrine   Hypothyroidism    We well on her current regimen but we have not had a level drawn recently.  We will get this up-to-date.  She has gained some weight but feels like it is because she is been home more because of COVID and she is  been cooking a lot more.      Relevant Orders   COMPLETE METABOLIC PANEL WITH GFR   Lipid panel   Hemoglobin A1c   TSH   Controlled diabetes mellitus type 2 with complications ([place])    Well controlled. Continue current regimen. Follow up in  4 mo. reminded her to schedule her eye exam if possible this summer.  It got delayed because of COVID.      Relevant Orders   COMPLETE METABOLIC PANEL WITH GFR   Lipid panel   Hemoglobin A1c   TSH   POCT UA - Microalbumin (Completed)     Other   Paresthesia of both hands    Possible carpal tunnel.  Will treat with bilateral wrist splints to wear at night for the next 3 weeks.  If not improving then please let me know and we will investigate further with possible nerve conduction study.  Also discussed looking at her  workstation at home.  I suspect that being home and with her computer keyboard etc. probably in a different position than when she is at work it may be aggravating her hands and wrists.      Melanoma of left upper arm ([place])    Doing well overall with no sign of recurrence.  Though she does have some additional evaluation of the lymph nodes scheduled for Monday.  They are now following her every 6 months.         No orders of the defined types were placed in this encounter.   Follow-up: Return in about 3 months (around 10/23/2018) for Diabetes follow-up.    Beatrice Lecher, MD

## 2018-07-23 NOTE — Assessment & Plan Note (Signed)
Possible carpal tunnel.  Will treat with bilateral wrist splints to wear at night for the next 3 weeks.  If not improving then please let me know and we will investigate further with possible nerve conduction study.  Also discussed looking at her workstation at home.  I suspect that being home and with her computer keyboard etc. probably in a different position than when she is at work it may be aggravating her hands and wrists.

## 2018-07-23 NOTE — Assessment & Plan Note (Signed)
Well controlled. Continue current regimen. Follow up in  4 mo. reminded her to schedule her eye exam if possible this summer.  It got delayed because of COVID.

## 2018-07-23 NOTE — Assessment & Plan Note (Signed)
Well controlled. Continue current regimen. Follow up in  6 mo  

## 2018-07-30 ENCOUNTER — Other Ambulatory Visit: Payer: Self-pay | Admitting: Family Medicine

## 2018-08-07 ENCOUNTER — Encounter: Payer: Self-pay | Admitting: Family Medicine

## 2018-08-13 ENCOUNTER — Other Ambulatory Visit: Payer: Self-pay | Admitting: Family Medicine

## 2018-09-06 ENCOUNTER — Other Ambulatory Visit: Payer: Self-pay | Admitting: Family Medicine

## 2018-09-16 ENCOUNTER — Other Ambulatory Visit: Payer: Self-pay | Admitting: Family Medicine

## 2018-10-13 ENCOUNTER — Telehealth: Payer: Self-pay | Admitting: Family Medicine

## 2018-10-13 NOTE — Telephone Encounter (Signed)
Talking about the 24th?  If so then okay to use acute slot if needed and then just block a different slot for an additional acute.

## 2018-10-13 NOTE — Telephone Encounter (Signed)
Patient lives a couple of hours away, she has an appointment for 10/23/2018, but she was wanting to line up her appointments for the Thursday before, no appropriate slots opened in the afternoon, she was wanting to know if she could possibly get worked in. Please advise.

## 2018-10-22 ENCOUNTER — Ambulatory Visit (INDEPENDENT_AMBULATORY_CARE_PROVIDER_SITE_OTHER): Payer: BC Managed Care – PPO | Admitting: Family Medicine

## 2018-10-22 ENCOUNTER — Other Ambulatory Visit: Payer: Self-pay

## 2018-10-22 ENCOUNTER — Encounter: Payer: Self-pay | Admitting: Family Medicine

## 2018-10-22 VITALS — BP 153/58 | HR 81 | Wt 202.0 lb

## 2018-10-22 DIAGNOSIS — E1165 Type 2 diabetes mellitus with hyperglycemia: Secondary | ICD-10-CM

## 2018-10-22 DIAGNOSIS — I10 Essential (primary) hypertension: Secondary | ICD-10-CM

## 2018-10-22 DIAGNOSIS — C779 Secondary and unspecified malignant neoplasm of lymph node, unspecified: Secondary | ICD-10-CM

## 2018-10-22 DIAGNOSIS — E118 Type 2 diabetes mellitus with unspecified complications: Secondary | ICD-10-CM | POA: Diagnosis not present

## 2018-10-22 DIAGNOSIS — C439 Malignant melanoma of skin, unspecified: Secondary | ICD-10-CM | POA: Diagnosis not present

## 2018-10-22 DIAGNOSIS — IMO0001 Reserved for inherently not codable concepts without codable children: Secondary | ICD-10-CM

## 2018-10-22 DIAGNOSIS — Z794 Long term (current) use of insulin: Secondary | ICD-10-CM

## 2018-10-22 LAB — POCT GLYCOSYLATED HEMOGLOBIN (HGB A1C): Hemoglobin A1C: 6.1 % — AB (ref 4.0–5.6)

## 2018-10-22 NOTE — Assessment & Plan Note (Signed)
She reports that her eye exam is up-to-date so we will call and try to get a copy of that report.  A1c looks good today at 6.1.  Continue current regimen and follow-up in 4 months.

## 2018-10-22 NOTE — Assessment & Plan Note (Signed)
Well controlled. Continue current regimen. Follow up in  4 mo 

## 2018-10-22 NOTE — Assessment & Plan Note (Signed)
Had f/u scan today.

## 2018-10-22 NOTE — Progress Notes (Signed)
Established Patient Office Visit  Subjective:  Patient ID: Pamela Holloway, female    DOB: Sep 12, 1960  Age: 58 y.o. MRN: [id number]  CC:  Chief Complaint  Patient presents with  . Diabetes    HPI Pamela Holloway presents for  Diabetes - no hypoglycemic events. No wounds or sores that are not healing well. No increased thirst or urination. Checking glucose at home. Taking medications as prescribed without any side effects.  Hypertension- Pt denies chest pain, SOB, dizziness, or heart palpitations.  Taking meds as directed w/o problems.  Denies medication side effects.    Melanoma-overall she is doing well she is completed her treatments and she actually had a PET scan today for follow-up.  She is hopeful that the results will be negative.  She still struggling with her carpal tunnel in her left wrist and hand.  She does data entry for living.  Fortunately, she has been able to work from home over this time and says that is actually been great for her.  She did reach out to work to see if she might qualify for [employer] since the injury is clearly from repetitive typing.  She says her initial request was declined.   Past Medical History:  Diagnosis Date  . Cancer ([place])    Melanoma-Stage 3  . Diabetes mellitus without complication ([place])   . Family history of adverse reaction to anesthesia    son had n/v  . Hepatitis    h/o hep C, 2012 no signs  . Hyperlipidemia   . Hypertension   . Hypothyroidism   . Thyroid disease    Hypothyroid    Past Surgical History:  Procedure Laterality Date  . CHOLECYSTECTOMY    . LAPAROSCOPIC GASTRIC BANDING    . MELANOMA EXCISION    . TONSILLECTOMY    . TOTAL HIP ARTHROPLASTY Right 08/28/2015   Procedure: TOTAL HIP ARTHROPLASTY ANTERIOR APPROACH;  Surgeon: Frederik Pear, MD;  Location: [facility];  Service: Orthopedics;  Laterality: Right;    Family History  Problem Relation Age of Onset  . Diabetes Mother   . Hypertension Mother   . Heart  disease Mother   . Diabetes Father   . Hypertension Father     Social History   Socioeconomic History  . Marital status: Married    Spouse name: Not on file  . Number of children: Not on file  . Years of education: Not on file  . Highest education level: Not on file  Occupational History  . Not on file  Social Needs  . Financial resource strain: Not on file  . Food insecurity    Worry: Not on file    Inability: Not on file  . Transportation needs    Medical: Not on file    Non-medical: Not on file  Tobacco Use  . Smoking status: Former Smoker    Packs/day: 2.00    Years: 12.00    Pack years: 24.00    Types: Cigarettes  . Smokeless tobacco: Never Used  Substance and Sexual Activity  . Alcohol use: Yes    Alcohol/week: 0.0 standard drinks    Comment: rare  . Drug use: No  . Sexual activity: Yes    Birth control/protection: Other-see comments    Comment: vasectomy-1st intercourse 2 yo-1 partner  Lifestyle  . Physical activity    Days per week: Not on file    Minutes per session: Not on file  . Stress: Not on file  Relationships  .  Social Herbalist on phone: Not on file    Gets together: Not on file    Attends religious service: Not on file    Active member of club or organization: Not on file    Attends meetings of clubs or organizations: Not on file    Relationship status: Not on file  . Intimate partner violence    Fear of current or ex partner: Not on file    Emotionally abused: Not on file    Physically abused: Not on file    Forced sexual activity: Not on file  Other Topics Concern  . Not on file  Social History Narrative  . Not on file    Outpatient Medications Prior to Visit  Medication Sig Dispense Refill  . BD PEN NEEDLE NANO U/F 32G X 4 MM MISC INJECT 1 EACH INTO THE SKIN DAILY. USE DAILY TO INJECT TRESIBA 100 each 3  . CREON 36000 units CPEP capsule Take 1 capsule by mouth 3 (three) times daily with meals.  11  . Dulaglutide  (TRULICITY) 1.5 0000000 SOPN Inject 1.5 mg into the skin every 7 (seven) days. 12 pen 5  . FARXIGA 10 MG TABS tablet TAKE 1 TABLET DAILY 90 tablet 3  . glipiZIDE (GLUCOTROL) 10 MG tablet TAKE 1 TABLET TWICE DAILY  BEFORE MEALS. 180 tablet 1  . hydrochlorothiazide (HYDRODIURIL) 25 MG tablet TAKE 1/2 TABLET (=12.5MG )  DAILY 45 tablet 2  . losartan-hydrochlorothiazide (HYZAAR) 100-25 MG tablet TAKE 1 TABLET DAILY 90 tablet 1  . meloxicam (MOBIC) 15 MG tablet Take 1 tablet (15 mg total) by mouth daily. 90 tablet 3  . metFORMIN (GLUCOPHAGE) 1000 MG tablet TAKE 1 TABLET TWICE A DAY  WITH A MEAL 180 tablet 3  . omega-3 acid ethyl esters (LOVAZA) 1 g capsule TAKE 2 CAPSULES TWICE DAILY 360 capsule 3  . omeprazole (PRILOSEC) 20 MG capsule Take 20 mg by mouth daily.  3  . pravastatin (PRAVACHOL) 40 MG tablet TAKE 1 TABLET DAILY 90 tablet 3  . SYNTHROID 150 MCG tablet TAKE 1 TABLET DAILY BEFORE BREAKFAST 90 tablet 1  . SYNTHROID 50 MCG tablet TAKE 1 TABLET EVERY 7 DAYS 13 tablet 2  . TRESIBA FLEXTOUCH 100 UNIT/ML SOPN FlexTouch Pen INJECT 130 UNITS TOTAL INTO THE SKIN AT BEDTIME 60 pen 2  . UNKNOWN TO PATIENT Chemo for Melanoma     No facility-administered medications prior to visit.     Allergies  Allergen Reactions  . Aspirin Swelling    REACTION: lips swell Swelling of lip Swelling of lip Swelling of lip Swelling of lip   . Atorvastatin Other (See Comments)    Myalgia   . Invokana [Canagliflozin] Other (See Comments)    Recurrent UTI  . Old Tuberculin Other (See Comments)    Other reaction(s): Other  . Potassium Diarrhea  . Bee Venom   . Cephalexin Diarrhea and Nausea Only  . Other     Other reaction(s): Other (See Comments) TB Skin test caused local reaction of redness TB Skin test caused local reaction of redness     ROS Review of Systems    Objective:    Physical Exam  BP (!) 153/58   Pulse 81   Wt 202 lb (91.6 kg)   LMP 09/29/2011   BMI 34.67 kg/m  Wt Readings  from Last 3 Encounters:  10/22/18 202 lb (91.6 kg)  07/23/18 199 lb (90.3 kg)  04/03/18 195 lb (88.5 kg)  Health Maintenance Due  Topic Date Due  . HIV Screening  03/07/1974  . OPHTHALMOLOGY EXAM  05/29/2018    There are no preventive care reminders to display for this patient.  Lab Results  Component Value Date   TSH 1.20 07/23/2018   Lab Results  Component Value Date   WBC 6.6 07/23/2018   HGB 16.0 07/23/2018   HCT 48 (A) 07/23/2018   MCV 81.4 01/08/2018   PLT 182 07/23/2018   Lab Results  Component Value Date   NA 140 07/23/2018   K 3.3 (A) 07/23/2018   CO2 25 01/08/2018   GLUCOSE 157 (H) 01/08/2018   BUN 15 01/08/2018   CREATININE 0.83 01/08/2018   BILITOT 1.6 (H) 01/08/2018   ALKPHOS 89 08/08/2017   AST 37 (A) 07/23/2018   ALT 52 (A) 07/23/2018   PROT 7.4 01/08/2018   ALBUMIN 3.9 08/22/2016   CALCIUM 10.2 01/08/2018   ANIONGAP 12 08/29/2015   Lab Results  Component Value Date   CHOL 182 07/23/2018   Lab Results  Component Value Date   HDL 39 07/23/2018   Lab Results  Component Value Date   LDLCALC 47 11/11/2017   Lab Results  Component Value Date   TRIG 411 (A) 07/23/2018   Lab Results  Component Value Date   CHOLHDL 6.5 (H) 07/11/2016   Lab Results  Component Value Date   HGBA1C 6.1 (A) 10/22/2018      Assessment & Plan:   Problem List Items Addressed This Visit      Cardiovascular and Mediastinum   HYPERTENSION, BENIGN    Well controlled. Continue current regimen. Follow up in  4 mo        Endocrine   Controlled diabetes mellitus type 2 with complications [facility])    She reports that her eye exam is up-to-date so we will call and try to get a copy of that report.  A1c looks good today at 6.1.  Continue current regimen and follow-up in 4 months.        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        Other Visit Diagnoses    Diabetes mellitus type 2, uncontrolled,  without complications ([place])    -  Primary   Relevant Orders   POCT glycosylated hemoglobin (Hb A1C) (Completed)     Carpal tunnel syndrome, left-recommend that she consider an appeal for Worker's Comp.  If not then it may be worth a consultation on her own especially if her symptoms are worsening.  She now has pretty persistent numbness in her second third and fourth digits.  And she feels like she is getting a lesion on her palm.  No orders of the defined types were placed in this encounter.   Follow-up: Return in about 4 months (around 02/21/2019) for Diabetes follow-up.    Beatrice Lecher, MD

## 2018-10-23 ENCOUNTER — Ambulatory Visit: Payer: BC Managed Care – PPO | Admitting: Family Medicine

## 2018-10-27 ENCOUNTER — Encounter: Payer: Self-pay | Admitting: Gynecology

## 2018-11-07 ENCOUNTER — Other Ambulatory Visit: Payer: Self-pay | Admitting: Family Medicine

## 2018-11-23 ENCOUNTER — Other Ambulatory Visit: Payer: Self-pay | Admitting: Family Medicine

## 2018-12-14 ENCOUNTER — Other Ambulatory Visit: Payer: Self-pay | Admitting: Family Medicine

## 2019-01-01 ENCOUNTER — Other Ambulatory Visit: Payer: Self-pay | Admitting: Family Medicine

## 2019-01-15 ENCOUNTER — Other Ambulatory Visit: Payer: Self-pay | Admitting: Family Medicine

## 2019-01-18 ENCOUNTER — Other Ambulatory Visit: Payer: Self-pay

## 2019-02-05 ENCOUNTER — Other Ambulatory Visit: Payer: Self-pay | Admitting: Family Medicine

## 2019-02-19 ENCOUNTER — Other Ambulatory Visit: Payer: Self-pay | Admitting: Family Medicine

## 2019-02-25 ENCOUNTER — Ambulatory Visit: Payer: BC Managed Care – PPO | Admitting: Family Medicine

## 2019-04-09 ENCOUNTER — Other Ambulatory Visit: Payer: Self-pay | Admitting: Family Medicine

## 2019-04-29 ENCOUNTER — Encounter: Payer: Self-pay | Admitting: *Deleted

## 2019-04-29 ENCOUNTER — Other Ambulatory Visit: Payer: Self-pay | Admitting: Family Medicine

## 2019-04-29 LAB — HEMOGLOBIN A1C: A1c: 7.6

## 2019-07-03 ENCOUNTER — Other Ambulatory Visit: Payer: Self-pay | Admitting: Family Medicine

## 2019-07-03 DIAGNOSIS — E1165 Type 2 diabetes mellitus with hyperglycemia: Secondary | ICD-10-CM

## 2019-08-20 ENCOUNTER — Other Ambulatory Visit: Payer: Self-pay | Admitting: Family Medicine

## 2019-09-03 ENCOUNTER — Other Ambulatory Visit: Payer: Self-pay | Admitting: Family Medicine

## 2019-09-14 ENCOUNTER — Other Ambulatory Visit: Payer: Self-pay | Admitting: Family Medicine

## 2019-09-15 ENCOUNTER — Other Ambulatory Visit: Payer: Self-pay | Admitting: Family Medicine

## 2019-10-01 ENCOUNTER — Other Ambulatory Visit: Payer: Self-pay | Admitting: Family Medicine

## 2019-10-08 ENCOUNTER — Other Ambulatory Visit: Payer: Self-pay | Admitting: Family Medicine

## 2019-12-23 ENCOUNTER — Other Ambulatory Visit: Payer: Self-pay | Admitting: Family Medicine

## 2019-12-27 ENCOUNTER — Other Ambulatory Visit: Payer: Self-pay | Admitting: Family Medicine

## 2020-01-06 ENCOUNTER — Other Ambulatory Visit: Payer: Self-pay | Admitting: Family Medicine

## 2020-01-07 ENCOUNTER — Other Ambulatory Visit: Payer: Self-pay | Admitting: Family Medicine

## 2020-02-13 ENCOUNTER — Other Ambulatory Visit: Payer: Self-pay | Admitting: Family Medicine

## 2020-04-16 ENCOUNTER — Other Ambulatory Visit: Payer: Self-pay | Admitting: Family Medicine

## 2021-02-03 ENCOUNTER — Other Ambulatory Visit: Payer: Self-pay | Admitting: Family Medicine

## 2021-02-19 ENCOUNTER — Other Ambulatory Visit: Payer: Self-pay | Admitting: Family Medicine
